# Patient Record
Sex: Female | Born: 1955 | Race: White | Hispanic: No | Smoking: Former smoker
Health system: Southern US, Community
[De-identification: ages and names within clinical notes are randomized; demographics above are authoritative.]

## PROBLEM LIST (undated history)

## (undated) DIAGNOSIS — R3915 Urgency of urination: Secondary | ICD-10-CM

## (undated) DIAGNOSIS — D509 Iron deficiency anemia, unspecified: Secondary | ICD-10-CM

## (undated) DIAGNOSIS — E079 Disorder of thyroid, unspecified: Secondary | ICD-10-CM

## (undated) DIAGNOSIS — E785 Hyperlipidemia, unspecified: Secondary | ICD-10-CM

## (undated) DIAGNOSIS — N201 Calculus of ureter: Secondary | ICD-10-CM

## (undated) DIAGNOSIS — F32A Depression, unspecified: Secondary | ICD-10-CM

## (undated) DIAGNOSIS — T7840XA Allergy, unspecified, initial encounter: Secondary | ICD-10-CM

## (undated) DIAGNOSIS — K219 Gastro-esophageal reflux disease without esophagitis: Secondary | ICD-10-CM

## (undated) DIAGNOSIS — F329 Major depressive disorder, single episode, unspecified: Secondary | ICD-10-CM

## (undated) DIAGNOSIS — E119 Type 2 diabetes mellitus without complications: Secondary | ICD-10-CM

## (undated) DIAGNOSIS — Z974 Presence of external hearing-aid: Secondary | ICD-10-CM

## (undated) DIAGNOSIS — F411 Generalized anxiety disorder: Secondary | ICD-10-CM

## (undated) DIAGNOSIS — J302 Other seasonal allergic rhinitis: Secondary | ICD-10-CM

## (undated) DIAGNOSIS — Z8616 Personal history of COVID-19: Secondary | ICD-10-CM

## (undated) DIAGNOSIS — R011 Cardiac murmur, unspecified: Secondary | ICD-10-CM

## (undated) DIAGNOSIS — Z87442 Personal history of urinary calculi: Secondary | ICD-10-CM

## (undated) DIAGNOSIS — Z87440 Personal history of urinary (tract) infections: Secondary | ICD-10-CM

## (undated) DIAGNOSIS — E890 Postprocedural hypothyroidism: Secondary | ICD-10-CM

## (undated) DIAGNOSIS — K5900 Constipation, unspecified: Secondary | ICD-10-CM

## (undated) DIAGNOSIS — R002 Palpitations: Secondary | ICD-10-CM

## (undated) DIAGNOSIS — T8859XA Other complications of anesthesia, initial encounter: Secondary | ICD-10-CM

## (undated) DIAGNOSIS — M1990 Unspecified osteoarthritis, unspecified site: Secondary | ICD-10-CM

## (undated) DIAGNOSIS — Z8639 Personal history of other endocrine, nutritional and metabolic disease: Secondary | ICD-10-CM

## (undated) DIAGNOSIS — F419 Anxiety disorder, unspecified: Secondary | ICD-10-CM

## (undated) HISTORY — DX: Unspecified osteoarthritis, unspecified site: M19.90

## (undated) HISTORY — DX: Disorder of thyroid, unspecified: E07.9

## (undated) HISTORY — DX: Hyperlipidemia, unspecified: E78.5

## (undated) HISTORY — DX: Anxiety disorder, unspecified: F41.9

## (undated) HISTORY — DX: Gastro-esophageal reflux disease without esophagitis: K21.9

## (undated) HISTORY — DX: Type 2 diabetes mellitus without complications: E11.9

## (undated) HISTORY — PX: UPPER GASTROINTESTINAL ENDOSCOPY: SHX188

## (undated) HISTORY — DX: Depression, unspecified: F32.A

## (undated) HISTORY — DX: Allergy, unspecified, initial encounter: T78.40XA

---

## 2000-10-04 HISTORY — PX: TOTAL ABDOMINAL HYSTERECTOMY W/ BILATERAL SALPINGOOPHORECTOMY: SHX83

## 2010-10-04 HISTORY — PX: CYSTOSCOPY/URETEROSCOPY/HOLMIUM LASER/STENT PLACEMENT: SHX6546

## 2014-12-16 DIAGNOSIS — N200 Calculus of kidney: Secondary | ICD-10-CM | POA: Insufficient documentation

## 2014-12-16 HISTORY — DX: Calculus of kidney: N20.0

## 2015-10-12 DIAGNOSIS — I209 Angina pectoris, unspecified: Secondary | ICD-10-CM | POA: Insufficient documentation

## 2019-11-28 DIAGNOSIS — N6310 Unspecified lump in the right breast, unspecified quadrant: Secondary | ICD-10-CM | POA: Insufficient documentation

## 2020-02-02 DIAGNOSIS — Z8719 Personal history of other diseases of the digestive system: Secondary | ICD-10-CM

## 2020-02-02 HISTORY — PX: LAPAROSCOPIC NISSEN FUNDOPLICATION: SHX1932

## 2020-02-02 HISTORY — DX: Personal history of other diseases of the digestive system: Z87.19

## 2020-02-27 DIAGNOSIS — K219 Gastro-esophageal reflux disease without esophagitis: Secondary | ICD-10-CM | POA: Insufficient documentation

## 2021-01-21 DIAGNOSIS — M7989 Other specified soft tissue disorders: Secondary | ICD-10-CM | POA: Diagnosis not present

## 2021-01-21 DIAGNOSIS — S99929A Unspecified injury of unspecified foot, initial encounter: Secondary | ICD-10-CM | POA: Diagnosis not present

## 2021-02-05 ENCOUNTER — Encounter (HOSPITAL_BASED_OUTPATIENT_CLINIC_OR_DEPARTMENT_OTHER): Payer: Self-pay | Admitting: Nurse Practitioner

## 2021-02-05 ENCOUNTER — Other Ambulatory Visit: Payer: Self-pay

## 2021-02-05 ENCOUNTER — Other Ambulatory Visit (HOSPITAL_BASED_OUTPATIENT_CLINIC_OR_DEPARTMENT_OTHER)
Admission: RE | Admit: 2021-02-05 | Discharge: 2021-02-05 | Disposition: A | Discharge status: Home / Self Care | Payer: Medicare Other | Source: Ambulatory Visit | Attending: Nurse Practitioner | Admitting: Nurse Practitioner

## 2021-02-05 ENCOUNTER — Telehealth (HOSPITAL_BASED_OUTPATIENT_CLINIC_OR_DEPARTMENT_OTHER): Payer: Self-pay

## 2021-02-05 ENCOUNTER — Ambulatory Visit (INDEPENDENT_AMBULATORY_CARE_PROVIDER_SITE_OTHER): Payer: Medicare Other | Admitting: Nurse Practitioner

## 2021-02-05 VITALS — BP 131/64 | HR 105 | Ht 65.0 in | Wt 143.2 lb

## 2021-02-05 DIAGNOSIS — N898 Other specified noninflammatory disorders of vagina: Secondary | ICD-10-CM | POA: Insufficient documentation

## 2021-02-05 DIAGNOSIS — R350 Frequency of micturition: Secondary | ICD-10-CM

## 2021-02-05 DIAGNOSIS — M7501 Adhesive capsulitis of right shoulder: Secondary | ICD-10-CM | POA: Insufficient documentation

## 2021-02-05 DIAGNOSIS — Z7689 Persons encountering health services in other specified circumstances: Secondary | ICD-10-CM

## 2021-02-05 DIAGNOSIS — H539 Unspecified visual disturbance: Secondary | ICD-10-CM

## 2021-02-05 DIAGNOSIS — R000 Tachycardia, unspecified: Secondary | ICD-10-CM

## 2021-02-05 DIAGNOSIS — F411 Generalized anxiety disorder: Secondary | ICD-10-CM | POA: Diagnosis not present

## 2021-02-05 DIAGNOSIS — E890 Postprocedural hypothyroidism: Secondary | ICD-10-CM | POA: Insufficient documentation

## 2021-02-05 DIAGNOSIS — E1165 Type 2 diabetes mellitus with hyperglycemia: Secondary | ICD-10-CM | POA: Diagnosis not present

## 2021-02-05 DIAGNOSIS — E1169 Type 2 diabetes mellitus with other specified complication: Secondary | ICD-10-CM

## 2021-02-05 DIAGNOSIS — E1129 Type 2 diabetes mellitus with other diabetic kidney complication: Secondary | ICD-10-CM

## 2021-02-05 DIAGNOSIS — F320 Major depressive disorder, single episode, mild: Secondary | ICD-10-CM

## 2021-02-05 DIAGNOSIS — G43019 Migraine without aura, intractable, without status migrainosus: Secondary | ICD-10-CM

## 2021-02-05 DIAGNOSIS — R002 Palpitations: Secondary | ICD-10-CM

## 2021-02-05 DIAGNOSIS — E785 Hyperlipidemia, unspecified: Secondary | ICD-10-CM

## 2021-02-05 DIAGNOSIS — IMO0002 Reserved for concepts with insufficient information to code with codable children: Secondary | ICD-10-CM

## 2021-02-05 DIAGNOSIS — J301 Allergic rhinitis due to pollen: Secondary | ICD-10-CM

## 2021-02-05 HISTORY — DX: Persons encountering health services in other specified circumstances: Z76.89

## 2021-02-05 HISTORY — DX: Adhesive capsulitis of right shoulder: M75.01

## 2021-02-05 HISTORY — DX: Other specified noninflammatory disorders of vagina: N89.8

## 2021-02-05 LAB — POCT URINALYSIS DIP (CLINITEK)
Bilirubin, UA: NEGATIVE
Glucose, UA: NEGATIVE mg/dL
Ketones, POC UA: NEGATIVE mg/dL
Nitrite, UA: NEGATIVE
POC PROTEIN,UA: 100 — AB
Spec Grav, UA: 1.025 (ref 1.010–1.025)
Urobilinogen, UA: 0.2 E.U./dL
pH, UA: 6 (ref 5.0–8.0)

## 2021-02-05 LAB — POCT UA - MICROALBUMIN
Albumin/Creatinine Ratio, Urine, POC: 30
Creatinine, POC: 300 mg/dL
Microalbumin Ur, POC: 150 mg/L

## 2021-02-05 LAB — WET PREP, GENITAL
Clue Cells Wet Prep HPF POC: NONE SEEN
Sperm: NONE SEEN
Trich, Wet Prep: NONE SEEN
Yeast Wet Prep HPF POC: NONE SEEN

## 2021-02-05 MED ORDER — NITROFURANTOIN MONOHYD MACRO 100 MG PO CAPS: 100.0000 mg | ORAL_CAPSULE | Freq: Two times a day (BID) | ORAL | 0 refills | Status: DC

## 2021-02-05 NOTE — Assessment & Plan Note (Signed)
Sertraline 50mg  daily.  Symptoms appear well controlled.  No changes today.  Will request refills as needed.

## 2021-02-05 NOTE — Assessment & Plan Note (Signed)
Review of current and past medical history, medications, social history, and family history today.  Will request records from Dr. Emeline General in [place] for review.  HM, diet, and exercise recommendations provided.

## 2021-02-05 NOTE — Assessment & Plan Note (Signed)
HR 108 in office today with regular rhythm. Patient has no associated symptoms present. No concerning findings on exam.  Consider imbalance in thyroid hormones a possible cause, labs drawn today. DM pt with urinary symptoms present may indicate possible dehydration, as well.  Pt reported symptoms present in past with recommendation for cardiology referral if continues. Referral placed today while labs pending for further evaluation.  Discussed emergency signs that would warrant immediate evaluation.

## 2021-02-05 NOTE — Assessment & Plan Note (Signed)
Limited ROM without pain to the right shoulder with history of frozen shoulder and previous therapy.  Referral for PT placed today for further evaluation and recommendations.

## 2021-02-05 NOTE — Patient Instructions (Addendum)
Recommendations from today's visit:  [employer] is an [employer] that sells inexpensive glasses that you can get with blue light and with the magnification.   I have sent the referral for physical therapy for your shoulder.   I have sent a referral to Cardiology for elevated heart rate and palpitations  I have sent a referral to Ophthalmology for eye care  I have ordered labs today of CBC, CMP, Thyroid, Cholesterol, and Hemoglobin A1c.   I will let you know your lab results and if we need to make any changes.   We will plan for follow-up in 4 months or sooner if needed.   Information on diet, exercise, and health maintenance recommendations are listed below. This is information to help you be sure you are on track for optimal health and monitoring. Please look over this and let us know if you have any questions or if you have completed any of the health maintenance outside of [place] so that we can be sure your records are up to date.  ___________________________________________________________  Thank you for choosing [facility] for your Primary Care needs. I am excited for the opportunity to partner with you to meet your health care goals. It was a pleasure meeting you today!  I am an Adult-Geriatric Nurse Practitioner with a background in caring for patients for more than 20 years. I received my Paediatric nurse in Nursing and my Doctor of Nursing Practice degrees at [employer]. I received additional fellowship training in primary care and sports medicine after receiving my doctorate degree. I provide primary care and sports medicine services to patients age 3 and older within this office. I am also a provider with the [facility] and the director of the APP Fellowship with [facility].  I am a [place] native, but have called the [place] area home for nearly 20 years and am proud to be a member of  this community.   I am passionate about providing the best service to you through preventive medicine and supportive care. I consider you a part of the medical team and value your input. I work diligently to ensure that you are heard and your needs are met in a safe and effective manner. I want you to feel comfortable with me as your provider and want you to know that your health concerns are important to me.   For your information, our office hours are Monday- Friday 8:00 AM - 5:00 PM At this time I am not in the office on Wednesdays.  If you have questions or concerns, please call our office at [phone] or send [place] a MyChart message and we will respond as quickly as possible.   For all urgent or time sensitive needs we ask that you please call the office to avoid delays. MyChart is not constantly monitored and replies may take up to 72 business hours.  MyChart Policy: . MyChart allows for you to see your visit notes, after visit summary, provider recommendations, lab and tests results, make an appointment, request refills, and contact your provider or the office for non-urgent questions or concerns.  . Providers are seeing patients during normal business hours and do not have built in time to review MyChart messages. We ask that you allow a minimum of 72 business hours for MyChart message responses.  . Complex MyChart concerns may require a visit. Your provider may request you schedule a virtual or in  person visit to ensure we are providing the best care possible. . MyChart messages sent after 4:00 PM on Friday will not be received by the provider until Monday morning.    Lab and Test Results: . You will receive your lab and test results on MyChart as soon as they are completed and results have been sent by the lab or testing facility. Due to this service, you will receive your results BEFORE your provider.  . Please allow a minimum of 72 business hours for your provider to receive and  review lab and test results and contact you about.   . Most lab and test result comments from the provider will be sent through [facility]. Your provider may recommend changes to the plan of care, follow-up visits, repeat testing, ask questions, or request an office visit to discuss these results. You may reply directly to this message or call the office at [phone] to provide information for the provider or set up an appointment. . In some instances, you will be called with test results and recommendations. Please let us know if this is preferred and we will make note of this in your chart to provide this for you.    . If you have not heard a response to your lab or test results in 72 business hours, please call the office to let us know.   After Hours: . For all non-emergency after hours needs, please call the office at [phone] and select the option to reach the on-call provider service. On-call services are shared between multiple [facility] offices and therefore it will not be possible to speak directly with your provider. On-call providers may provide medical advice and recommendations, but are unable to provide refills for maintenance medications.  . For all emergency or urgent medical needs after normal business hours, we recommend that you seek care at the closest Urgent Care or Emergency Department to ensure appropriate treatment in a timely manner.  [facility] [facility] has a 24 hour emergency room located on the ground floor for your convenience.    Please do not hesitate to reach out to [place] with concerns.   Thank you, again, for choosing me as your health care partner. I appreciate your trust and look forward to learning more about you.   Worthy Keeler, DNP, AGNP-c ___________________________________________________________  Health Maintenance Recommendations Screening Testing  Mammogram  Every 1 -2 years based on history and risk factors  Starting at age  51  Pap Smear  Ages 21-39 every 3 years  Ages 55-65 every 5 years with HPV testing  More frequent testing may be required based on results and history  Colon Cancer Screening  Every 1-10 years based on test performed, risk factors, and history  Starting at age 51  Bone Density Screening  Every 2-10 years based on history  Starting at age 52 for women  Recommendations for men differ based on medication usage, history, and risk factors  AAA Screening  One time ultrasound  Men 43-11 years old who have every smoked  Lung Cancer Screening  Low Dose Lung CT every 12 months  Age 46-80 years with a 30 pack-year smoking history who still smoke or who have quit within the last 15 years  Screening Labs  Routine  Labs: Complete Blood Count (CBC), Complete Metabolic Panel (CMP), Cholesterol (Lipid Panel)  Every 6-12 months based on history and medications  May be recommended more frequently based on current conditions or previous results  Hemoglobin  A1c Lab  Every 3-12 months based on history and previous results  Starting at age 67 or earlier with diagnosis of diabetes, high cholesterol, BMI >26, and/or risk factors  Frequent monitoring for patients with diabetes to ensure blood sugar control  Thyroid Panel (TSH w/ T3 & T4)  Every 6 months based on history, symptoms, and risk factors  May be repeated more often if on medication  HIV  One time testing for all patients 49 and older  May be repeated more frequently for patients with increased risk factors or exposure  Hepatitis C  One time testing for all patients 67 and older  May be repeated more frequently for patients with increased risk factors or exposure  Gonorrhea, Chlamydia  Every 12 months for all sexually active persons 13-24 years  Additional monitoring may be recommended for those who are considered high risk or who have symptoms  PSA  Men 86-39 years old with risk factors  Additional  screening may be recommended from age 46-69 based on risk factors, symptoms, and history  Vaccine Recommendations  Tetanus Booster  All adults every 10 years  Flu Vaccine  All patients 6 months and older every year  COVID Vaccine  All patients 12 years and older  Initial dosing with booster  May recommend additional booster based on age and health history  HPV Vaccine  2 doses all patients age 60-26  Dosing may be considered for patients over 26  Shingles Vaccine (Shingrix)  2 doses all adults 79 years and older  Pneumonia (Pneumovax 23)  All adults 13 years and older  May recommend earlier dosing based on health history  Pneumonia (Prevnar 62)  All adults 40 years and older  Dosed 1 year after Pneumovax 23  Additional Screening, Testing, and Vaccinations may be recommended on an individualized basis based on family history, health history, risk factors, and/or exposure.  __________________________________________________________  Diet Recommendations for All Patients  I recommend that all patients maintain a diet low in saturated fats, carbohydrates, and cholesterol. While this can be challenging at first, it is not impossible and small changes can make big differences.  Things to try: Marland Kitchen Decreasing the amount of soda, sweet tea, and/or juice to one or less per day and replace with water o While water is always the first choice, if you do not like water you may consider - adding a water additive without sugar to improve the taste - other sugar free drinks . Replace potatoes with a brightly colored vegetable at dinner . Use healthy oils, such as canola oil or olive oil, instead of butter or hard margarine . Limit your bread intake to two pieces or less a day . Replace regular pasta with low carb pasta options . Bake, broil, or grill foods instead of frying . Monitor portion sizes  . Eat smaller, more frequent meals throughout the day instead of large  meals  An important thing to remember is, if you love foods that are not great for your health, you don't have to give them up completely. Instead, allow these foods to be a reward when you have done well. Allowing yourself to still have special treats every once in a while is a nice way to tell yourself thank you for working hard to keep yourself healthy.   Also remember that every day is a new day. If you have a bad day and "fall off the wagon", you can still climb right back up and keep moving along on  your journey!  We have resources available to help you!  Some websites that may be helpful include: . www.http://carter.biz/  . Www.VeryWellFit.com _____________________________________________________________  Activity Recommendations for All Patients  I recommend that all adults get at least 20 minutes of moderate physical activity that elevates your heart rate at least 5 days out of the week.  Some examples include: . Walking or jogging at a pace that allows you to carry on a conversation . Cycling (stationary bike or outdoors) . Water aerobics . Yoga . Weight lifting . Dancing If physical limitations prevent you from putting stress on your joints, exercise in a pool or seated in a chair are excellent options.  Do determine your MAXIMUM heart rate for activity: YOUR AGE - 220 = MAX HeartRate   Remember! . Do not push yourself too hard.  . Start slowly and build up your pace, speed, weight, time in exercise, etc.  . Allow your body to rest between exercise and get good sleep. . You will need more water than normal when you are exerting yourself. Do not wait until you are thirsty to drink. Drink with a purpose of getting in at least 8, 8 ounce glasses of water a day plus more depending on how much you exercise and sweat.    If you begin to develop dizziness, chest pain, abdominal pain, jaw pain, shortness of breath, headache, vision changes, lightheadedness, or other concerning symptoms,  stop the activity and allow your body to rest. If your symptoms are severe, seek emergency evaluation immediately. If your symptoms are concerning, but not severe, please let us know so that we can recommend further evaluation.   ________________________________________________________________

## 2021-02-05 NOTE — Assessment & Plan Note (Signed)
Report of floaters intermittently only occurring upon awakening with no other symptoms present. No vision changes. No alarm symptoms present. Vision intact.  History of lasix surgery with reported 20/20 vision for last 15 years. Hx of DM. BP normal today.  Referral placed to Ophthalmology today.

## 2021-02-05 NOTE — Assessment & Plan Note (Signed)
Records to be requested from previous provider.  Currently on Metformin 750mg  q AM, 500mg  qPM Urine microalbumin positive today.  Will obtain A1c and renal function and make changes to plan of care as necessary based on lab results. F/U in 4 months.

## 2021-02-05 NOTE — Assessment & Plan Note (Signed)
Well controlled with daily nortriptyline.  No acute findings noted.  Continue medication as directed.  Refills will be requested when needed.

## 2021-02-05 NOTE — Assessment & Plan Note (Signed)
Well controlled with PRN flonase and zyrtec.  No concerning findings today.
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
## 2021-02-05 NOTE — Assessment & Plan Note (Signed)
Levothyroxine daily Will obtain TSH levels today for evaluation and make changes to plan of care as needed based on lab results.  Tachycardia with regular rhythm noted- possibly due to thyroid, but given symptom presence for quite some time will also send referral to cardiology for further evaluation.

## 2021-02-05 NOTE — Assessment & Plan Note (Signed)
UA in office today shows presence of leukocytes with small amount of blood.  Proteins present in the urine, as well.  Will treat for suspected UTI and obtain labs for diabetes and kidney function for further evaluation.  Concern over worsening kidney function present.

## 2021-02-05 NOTE — Telephone Encounter (Signed)
patient called in to check the status of the antibiotic that she was supposed to be prescribed for a UTI Will forward to [name], AGNP for advisement. Patient request that prescription be sent to [facility] that is on file

## 2021-02-05 NOTE — Assessment & Plan Note (Signed)
Subjective palpitations with no irregularities to beat or associated symptoms present.  See tachycardia for full A&P

## 2021-02-05 NOTE — Assessment & Plan Note (Signed)
Sertraline 50mg  per day with PRN 0.5mg  xanax (mostly used for flying anxiety). Symptoms appear well controlled.  No changes today. Discussed option to decrease dose if she wishes in the future, but strongly recommend that she wait until she has been at least 12 months out of significant life events to prevent recurrence of severe symptoms.

## 2021-02-05 NOTE — Assessment & Plan Note (Signed)
Wet prep collected today- self swab.  Question whether diabetes is not well controlled at this time.  Will obtain labs for evaluation.

## 2021-02-05 NOTE — Assessment & Plan Note (Signed)
Atorvastatin 40mg  daily.  Will obtain labs today for evaluation.  F/U in 4 months.

## 2021-02-06 ENCOUNTER — Other Ambulatory Visit (HOSPITAL_BASED_OUTPATIENT_CLINIC_OR_DEPARTMENT_OTHER)
Admission: RE | Admit: 2021-02-06 | Discharge: 2021-02-06 | Disposition: A | Discharge status: Home / Self Care | Payer: Medicare Other | Source: Ambulatory Visit | Attending: Nurse Practitioner | Admitting: Nurse Practitioner

## 2021-02-06 ENCOUNTER — Other Ambulatory Visit (HOSPITAL_BASED_OUTPATIENT_CLINIC_OR_DEPARTMENT_OTHER): Payer: Self-pay | Admitting: Nurse Practitioner

## 2021-02-06 DIAGNOSIS — R002 Palpitations: Secondary | ICD-10-CM

## 2021-02-06 DIAGNOSIS — N898 Other specified noninflammatory disorders of vagina: Secondary | ICD-10-CM

## 2021-02-06 DIAGNOSIS — R000 Tachycardia, unspecified: Secondary | ICD-10-CM

## 2021-02-06 DIAGNOSIS — E1165 Type 2 diabetes mellitus with hyperglycemia: Secondary | ICD-10-CM

## 2021-02-06 DIAGNOSIS — F411 Generalized anxiety disorder: Secondary | ICD-10-CM

## 2021-02-06 DIAGNOSIS — E785 Hyperlipidemia, unspecified: Secondary | ICD-10-CM | POA: Insufficient documentation

## 2021-02-06 DIAGNOSIS — E1169 Type 2 diabetes mellitus with other specified complication: Secondary | ICD-10-CM | POA: Diagnosis not present

## 2021-02-06 DIAGNOSIS — E890 Postprocedural hypothyroidism: Secondary | ICD-10-CM

## 2021-02-06 DIAGNOSIS — H539 Unspecified visual disturbance: Secondary | ICD-10-CM | POA: Diagnosis not present

## 2021-02-06 DIAGNOSIS — F320 Major depressive disorder, single episode, mild: Secondary | ICD-10-CM | POA: Diagnosis not present

## 2021-02-06 LAB — CBC WITH DIFFERENTIAL/PLATELET
Abs Immature Granulocytes: 0.03 10*3/uL (ref 0.00–0.07)
Basophils Absolute: 0 10*3/uL (ref 0.0–0.1)
Basophils Relative: 0 %
Eosinophils Absolute: 0.2 10*3/uL (ref 0.0–0.5)
Eosinophils Relative: 1 %
HCT: 43.6 % (ref 36.0–46.0)
Hemoglobin: 13.8 g/dL (ref 12.0–15.0)
Immature Granulocytes: 0 %
Lymphocytes Relative: 15 %
Lymphs Abs: 2 10*3/uL (ref 0.7–4.0)
MCH: 28.8 pg (ref 26.0–34.0)
MCHC: 31.7 g/dL (ref 30.0–36.0)
MCV: 90.8 fL (ref 80.0–100.0)
Monocytes Absolute: 0.9 10*3/uL (ref 0.1–1.0)
Monocytes Relative: 7 %
Neutro Abs: 10 10*3/uL — ABNORMAL HIGH (ref 1.7–7.7)
Neutrophils Relative %: 77 %
Platelets: 308 10*3/uL (ref 150–400)
RBC: 4.8 MIL/uL (ref 3.87–5.11)
RDW: 12.2 % (ref 11.5–15.5)
WBC: 13.2 10*3/uL — ABNORMAL HIGH (ref 4.0–10.5)
nRBC: 0 % (ref 0.0–0.2)

## 2021-02-06 LAB — COMPREHENSIVE METABOLIC PANEL
ALT: 24 U/L (ref 0–44)
AST: 20 U/L (ref 15–41)
Albumin: 4.3 g/dL (ref 3.5–5.0)
Alkaline Phosphatase: 78 U/L (ref 38–126)
Anion gap: 11 (ref 5–15)
BUN: 29 mg/dL — ABNORMAL HIGH (ref 8–23)
CO2: 27 mmol/L (ref 22–32)
Calcium: 9.8 mg/dL (ref 8.9–10.3)
Chloride: 100 mmol/L (ref 98–111)
Creatinine, Ser: 1.08 mg/dL — ABNORMAL HIGH (ref 0.44–1.00)
GFR, Estimated: 57 mL/min — ABNORMAL LOW (ref >60)
Glucose, Bld: 171 mg/dL — ABNORMAL HIGH (ref 70–99)
Potassium: 4 mmol/L (ref 3.5–5.1)
Sodium: 138 mmol/L (ref 135–145)
Total Bilirubin: 0.4 mg/dL (ref 0.3–1.2)
Total Protein: 7.5 g/dL (ref 6.5–8.1)

## 2021-02-06 LAB — LIPID PANEL
Cholesterol: 163 mg/dL (ref 0–200)
HDL: 48 mg/dL (ref >40)
LDL Cholesterol: 82 mg/dL (ref 0–99)
Total CHOL/HDL Ratio: 3.4 RATIO
Triglycerides: 163 mg/dL — ABNORMAL HIGH (ref <150)
VLDL: 33 mg/dL (ref 0–40)

## 2021-02-06 LAB — HEMOGLOBIN A1C
Hgb A1c MFr Bld: 6.6 % — ABNORMAL HIGH (ref 4.8–5.6)
Mean Plasma Glucose: 142.72 mg/dL

## 2021-02-06 MED ORDER — FLUCONAZOLE 150 MG PO TABS: 150.0000 mg | ORAL_TABLET | Freq: Once | ORAL | 0 refills | Status: AC

## 2021-02-06 NOTE — Progress Notes (Signed)
No yeast, clue, or trich present.  WBC present indicating infection- typically yeast.  Given symptoms will treat with one dose of diflucan. If symptoms still present Monday, patient will contact the office.

## 2021-02-06 NOTE — Progress Notes (Signed)
Please send patient a message. Hbg A1c is 6.6. Ideally we want this less than 7 in diabetic patients so this is a good spot for you.   Triglycerides are a little elevated- this is the fat in your blood and can lead to problems with your pancreas if it gets too high.  Recommend decreasing the fats in your diet and walking 20 minutes 5 days a week to help lower this.   Your kidney function is not as good as I would like for it to be- this could be from the UTI so we will plan to recheck this when you come back for your next visit.

## 2021-02-07 LAB — THYROID PANEL WITH TSH
Free Thyroxine Index: 4.5 (ref 1.2–4.9)
T3 Uptake Ratio: 33 % (ref 24–39)
T4, Total: 13.5 ug/dL — ABNORMAL HIGH (ref 4.5–12.0)
TSH: 3.09 u[IU]/mL (ref 0.450–4.500)

## 2021-02-09 ENCOUNTER — Telehealth (HOSPITAL_BASED_OUTPATIENT_CLINIC_OR_DEPARTMENT_OTHER): Payer: Self-pay

## 2021-02-09 MED ORDER — LISINOPRIL 2.5 MG PO TABS: 2.5000 mg | ORAL_TABLET | Freq: Every day | ORAL | 1 refills | Status: DC

## 2021-02-09 MED ORDER — METFORMIN HCL 500 MG PO TABS: 500.0000 mg | ORAL_TABLET | Freq: Two times a day (BID) | ORAL | 1 refills | Status: DC

## 2021-02-09 NOTE — Telephone Encounter (Signed)
Patient called for refills on Lisinopril 2.5 mg and Metformin 500 mg to be sent in to [facility] on [address].

## 2021-02-09 NOTE — Telephone Encounter (Signed)
-----   Message from Tollie Eth, NP sent at 02/06/2021  6:22 PM EDT ----- Please send patient a message. Hbg A1c is 6.6. Ideally we want this less than 7 in diabetic patients so this is a good spot for you.   Triglycerides are a little elevated- this is the fat in your blood and can lead to problems with your pancreas if it gets too high.  Recommend decreasing the fats in your diet and walking 20 minutes 5 days a week to help lower this.   Your kidney function is not as good as I would like for it to be- this could be from the UTI so we will plan to recheck this when you come back for your next visit.

## 2021-02-09 NOTE — Telephone Encounter (Signed)
Patient called to request medication refills. Spoke with patient re: lab results.  She is  aware and agreeable with recommendations.

## 2021-02-10 ENCOUNTER — Telehealth (HOSPITAL_BASED_OUTPATIENT_CLINIC_OR_DEPARTMENT_OTHER): Payer: Self-pay

## 2021-02-10 NOTE — Telephone Encounter (Signed)
Patient called to report she is still having discomfort and UTI symptoms.  Scheduled a nurse visit for her to submit a urine sample.

## 2021-02-10 NOTE — Progress Notes (Signed)
Please send patient message:  Thyroid function looks good- no concerns or changes recommended.

## 2021-02-11 ENCOUNTER — Encounter (HOSPITAL_BASED_OUTPATIENT_CLINIC_OR_DEPARTMENT_OTHER): Payer: Self-pay

## 2021-02-11 ENCOUNTER — Other Ambulatory Visit: Payer: Self-pay

## 2021-02-11 ENCOUNTER — Other Ambulatory Visit (HOSPITAL_BASED_OUTPATIENT_CLINIC_OR_DEPARTMENT_OTHER)
Admission: RE | Admit: 2021-02-11 | Discharge: 2021-02-11 | Disposition: A | Discharge status: Home / Self Care | Payer: Medicare Other | Source: Ambulatory Visit | Attending: Nurse Practitioner | Admitting: Nurse Practitioner

## 2021-02-11 ENCOUNTER — Ambulatory Visit (INDEPENDENT_AMBULATORY_CARE_PROVIDER_SITE_OTHER): Payer: Medicare Other | Admitting: Nurse Practitioner

## 2021-02-11 ENCOUNTER — Other Ambulatory Visit (INDEPENDENT_AMBULATORY_CARE_PROVIDER_SITE_OTHER): Payer: Medicare Other | Admitting: Nurse Practitioner

## 2021-02-11 VITALS — BP 135/75 | Wt 145.4 lb

## 2021-02-11 DIAGNOSIS — N3289 Other specified disorders of bladder: Secondary | ICD-10-CM

## 2021-02-11 DIAGNOSIS — R300 Dysuria: Secondary | ICD-10-CM

## 2021-02-11 DIAGNOSIS — R350 Frequency of micturition: Secondary | ICD-10-CM

## 2021-02-11 DIAGNOSIS — N3090 Cystitis, unspecified without hematuria: Secondary | ICD-10-CM

## 2021-02-11 LAB — POCT URINALYSIS DIPSTICK
Bilirubin, UA: NEGATIVE
Glucose, UA: NEGATIVE
Ketones, UA: NEGATIVE
Nitrite, UA: NEGATIVE
Protein, UA: NEGATIVE
Spec Grav, UA: 1.015 (ref 1.010–1.025)
Urobilinogen, UA: 0.2 E.U./dL
pH, UA: 5.5 (ref 5.0–8.0)

## 2021-02-11 MED ORDER — PHENAZOPYRIDINE HCL 200 MG PO TABS: 200.0000 mg | ORAL_TABLET | Freq: Three times a day (TID) | ORAL | 0 refills | Status: AC

## 2021-02-11 MED ORDER — CIPROFLOXACIN HCL 250 MG PO TABS: 250.0000 mg | ORAL_TABLET | Freq: Two times a day (BID) | ORAL | 0 refills | Status: AC

## 2021-02-11 NOTE — Progress Notes (Signed)
Dysuria, bladder spasms, increased urination, and low back pain symptoms reported. No fever, chills, nausea, vomiting.   Repeat urinalysis at nurse visit reveals continued presents of large leukocytes and small amount of blood despite recent treatment with macrobid.  Will begin treatment with ciprofloxacin 250mg  BID for 3 days. Will add pyridium 200mg  TID for 2 days for acute symptom control.  Cockcroft Gault calculation reveals estimated creatinine clearance at 54.39 mL/min  UpToDate review of recommendations for adjusted dosing with decreased kidney function indicates no dose adjustment necessary for creatinine clearance >61mL/min.   Will send urine for culture and sensitivity to ensure appropriate treatment review and make changes to medication if necessary.   Pt to follow-up if symptoms worsen or persist despite treatment.   - , DNP, AGNP-c

## 2021-02-11 NOTE — Patient Instructions (Signed)
Urinary Tract Infection, Adult A urinary tract infection (UTI) is an infection of any part of the urinary tract. The urinary tract includes:  The kidneys.  The ureters.  The bladder.  The urethra. These organs make, store, and get rid of pee (urine) in the body. What are the causes? This infection is caused by germs (bacteria) in your genital area. These germs grow and cause swelling (inflammation) of your urinary tract. What increases the risk? The following factors may make you more likely to develop this condition:  Using a small, thin tube (catheter) to drain pee.  Not being able to control when you pee or poop (incontinence).  Being female. If you are female, these things can increase the risk: ? Using these methods to prevent pregnancy:  A medicine that kills sperm (spermicide).  A device that blocks sperm (diaphragm). ? Having low levels of a female hormone (estrogen). ? Being pregnant. You are more likely to develop this condition if:  You have genes that add to your risk.  You are sexually active.  You take antibiotic medicines.  You have trouble peeing because of: ? A prostate that is bigger than normal, if you are female. ? A blockage in the part of your body that drains pee from the bladder. ? A kidney stone. ? A nerve condition that affects your bladder. ? Not getting enough to drink. ? Not peeing often enough.  You have other conditions, such as: ? Diabetes. ? A weak disease-fighting system (immune system). ? Sickle cell disease. ? Gout. ? Injury of the spine. What are the signs or symptoms? Symptoms of this condition include:  Needing to pee right away.  Peeing small amounts often.  Pain or burning when peeing.  Blood in the pee.  Pee that smells bad or not like normal.  Trouble peeing.  Pee that is cloudy.  Fluid coming from the vagina, if you are female.  Pain in the belly or lower back. Other symptoms include:  Vomiting.  Not  feeling hungry.  Feeling mixed up (confused). This may be the first symptom in older adults.  Being tired and grouchy (irritable).  A fever.  Watery poop (diarrhea). How is this treated?  Taking antibiotic medicine.  Taking other medicines.  Drinking enough water. In some cases, you may need to see a specialist. Follow these instructions at home: Medicines  Take over-the-counter and prescription medicines only as told by your doctor.  If you were prescribed an antibiotic medicine, take it as told by your doctor. Do not stop taking it even if you start to feel better. General instructions  Make sure you: ? Pee until your bladder is empty. ? Do not hold pee for a long time. ? Empty your bladder after sex. ? Wipe from front to back after peeing or pooping if you are a female. Use each tissue one time when you wipe.  Drink enough fluid to keep your pee pale yellow.  Keep all follow-up visits.   Contact a doctor if:  You do not get better after 1-2 days.  Your symptoms go away and then come back. Get help right away if:  You have very bad back pain.  You have very bad pain in your lower belly.  You have a fever.  You have chills.  You feeling like you will vomit or you vomit. Summary  A urinary tract infection (UTI) is an infection of any part of the urinary tract.  This condition is caused by   germs in your genital area.  There are many risk factors for a UTI.  Treatment includes antibiotic medicines.  Drink enough fluid to keep your pee pale yellow. This information is not intended to replace advice given to you by your health care provider. Make sure you discuss any questions you have with your health care provider. Document Revised: 05/02/2020 Document Reviewed: 05/02/2020 Elsevier Patient Education  2021 [employer] Inc[employer]  

## 2021-02-11 NOTE — Progress Notes (Signed)
SUBJECTIVE:  65 y.o. complains of urinary frequency, urgency and dysuria for 14 days. No flank pain, fever, chills, or abnormal vaginal discharge or bleeding. Urine dip shows positive for WBC's, positive for RBC's and positive for leukocytes.  OBJECTIVE:  BP 135/75   Wt 145 lb 6.4 oz (66 kg)   BMI 24.20 kg/m  Patient appears well. No flank tenderness noted. ASSESSMENT:  Symptoms are consistent with uncomplicated UTI.  PLAN:  Prescription for sent by provider. Increase fluids, 8-10 glasses water daily. May use Pyridium prn sent by provider. Will notify patient if culture result requires any change in treatment plan. Follow up if symptoms persist or worsen.   Agree with CMA evaluation and recommendations for patient. Medications have been sent to pharmacy and patient educated on warning symptoms that would warrant further evaluation. Enid Skeens, DNP, AGNP

## 2021-02-12 ENCOUNTER — Telehealth (HOSPITAL_BASED_OUTPATIENT_CLINIC_OR_DEPARTMENT_OTHER): Payer: Self-pay

## 2021-02-12 NOTE — Telephone Encounter (Signed)
Received a message from the lab stating --  "The container was not accepted per [facility]. They cannot run the culture per sample submitted. If you still have a urine on the patient we can resend. The container was a covid tube. It needs to be in a C&S tube."  Will defer to [facility] Early for advisement.

## 2021-02-13 ENCOUNTER — Telehealth (HOSPITAL_BASED_OUTPATIENT_CLINIC_OR_DEPARTMENT_OTHER): Payer: Self-pay

## 2021-02-13 NOTE — Telephone Encounter (Signed)
-----   Message from Tollie Eth, NP sent at 02/10/2021  6:59 AM EDT ----- Please send patient message:  Thyroid function looks good- no concerns or changes recommended.

## 2021-02-13 NOTE — Telephone Encounter (Signed)
Results released by Sarabeth Early, AGNP.  Patient was given a copy of all lab results.  Instructed patient to contact the office with any questions or concerns.  

## 2021-02-16 NOTE — Telephone Encounter (Signed)
Results released by [name], AGNP.  Patient was given a copy of all lab results.  Instructed patient to contact the office with any questions or concerns.

## 2021-02-24 ENCOUNTER — Other Ambulatory Visit: Payer: Self-pay

## 2021-02-24 ENCOUNTER — Encounter (HOSPITAL_BASED_OUTPATIENT_CLINIC_OR_DEPARTMENT_OTHER): Payer: Self-pay | Admitting: Nurse Practitioner

## 2021-02-24 ENCOUNTER — Ambulatory Visit (INDEPENDENT_AMBULATORY_CARE_PROVIDER_SITE_OTHER): Payer: Medicare Other | Admitting: Nurse Practitioner

## 2021-02-24 VITALS — BP 120/55 | HR 91 | Ht 65.0 in | Wt 144.4 lb

## 2021-02-24 DIAGNOSIS — F320 Major depressive disorder, single episode, mild: Secondary | ICD-10-CM | POA: Diagnosis not present

## 2021-02-24 DIAGNOSIS — R350 Frequency of micturition: Secondary | ICD-10-CM | POA: Diagnosis not present

## 2021-02-24 DIAGNOSIS — M545 Low back pain, unspecified: Secondary | ICD-10-CM | POA: Diagnosis not present

## 2021-02-24 DIAGNOSIS — U099 Post covid-19 condition, unspecified: Secondary | ICD-10-CM

## 2021-02-24 DIAGNOSIS — R053 Chronic cough: Secondary | ICD-10-CM

## 2021-02-24 DIAGNOSIS — N3090 Cystitis, unspecified without hematuria: Secondary | ICD-10-CM | POA: Insufficient documentation

## 2021-02-24 HISTORY — DX: Chronic cough: R05.3

## 2021-02-24 LAB — POCT URINALYSIS DIPSTICK
Bilirubin, UA: NEGATIVE
Glucose, UA: NEGATIVE
Ketones, UA: NEGATIVE
Nitrite, UA: NEGATIVE
Protein, UA: POSITIVE — AB
Spec Grav, UA: 1.025 (ref 1.010–1.025)
Urobilinogen, UA: 0.2 E.U./dL
pH, UA: 5.5 (ref 5.0–8.0)

## 2021-02-24 MED ORDER — CIPROFLOXACIN HCL 500 MG PO TABS
500.0000 mg | ORAL_TABLET | Freq: Two times a day (BID) | ORAL | 0 refills | Status: DC
Start: 2021-02-24 — End: 2021-03-16

## 2021-02-24 NOTE — Assessment & Plan Note (Signed)
Symptoms consistent with recurrent cystitis infection.  Abx therapy started today while awaiting urine culture results given repeat nature.  Recommend aquaphor to vaginal opening and labia to help restore moisture to the vaginal introitus and restart topical estrogen cream once a week to see if this is helpful in reducing symptoms.

## 2021-02-24 NOTE — Patient Instructions (Addendum)
Start using the [place] once a week and up to once a day use the Aquaphor vaginally daily to help with vaginal moisture.      Urinary Tract Infection, Adult A urinary tract infection (UTI) is an infection of any part of the urinary tract. The urinary tract includes:  The kidneys.  The ureters.  The bladder.  The urethra. These organs make, store, and get rid of pee (urine) in the body. What are the causes? This infection is caused by germs (bacteria) in your genital area. These germs grow and cause swelling (inflammation) of your urinary tract. What increases the risk? The following factors may make you more likely to develop this condition:  Using a small, thin tube (catheter) to drain pee.  Not being able to control when you pee or poop (incontinence).  Being female. If you are female, these things can increase the risk: ? Using these methods to prevent pregnancy:  A medicine that kills sperm (spermicide).  A device that blocks sperm (diaphragm). ? Having low levels of a female hormone (estrogen). ? Being pregnant. You are more likely to develop this condition if:  You have genes that add to your risk.  You are sexually active.  You take antibiotic medicines.  You have trouble peeing because of: ? A prostate that is bigger than normal, if you are female. ? A blockage in the part of your body that drains pee from the bladder. ? A kidney stone. ? A nerve condition that affects your bladder. ? Not getting enough to drink. ? Not peeing often enough.  You have other conditions, such as: ? Diabetes. ? A weak disease-fighting system (immune system). ? Sickle cell disease. ? Gout. ? Injury of the spine. What are the signs or symptoms? Symptoms of this condition include:  Needing to pee right away.  Peeing small amounts often.  Pain or burning when peeing.  Blood in the pee.  Pee that smells bad or not like normal.  Trouble peeing.  Pee that is  cloudy.  Fluid coming from the vagina, if you are female.  Pain in the belly or lower back. Other symptoms include:  Vomiting.  Not feeling hungry.  Feeling mixed up (confused). This may be the first symptom in older adults.  Being tired and grouchy (irritable).  A fever.  Watery poop (diarrhea). How is this treated?  Taking antibiotic medicine.  Taking other medicines.  Drinking enough water. In some cases, you may need to see a specialist. Follow these instructions at home: Medicines  Take over-the-counter and prescription medicines only as told by your doctor.  If you were prescribed an antibiotic medicine, take it as told by your doctor. Do not stop taking it even if you start to feel better. General instructions  Make sure you: ? Pee until your bladder is empty. ? Do not hold pee for a long time. ? Empty your bladder after sex. ? Wipe from front to back after peeing or pooping if you are a female. Use each tissue one time when you wipe.  Drink enough fluid to keep your pee pale yellow.  Keep all follow-up visits.   Contact a doctor if:  You do not get better after 1-2 days.  Your symptoms go away and then come back. Get help right away if:  You have very bad back pain.  You have very bad pain in your lower belly.  You have a fever.  You have chills.  You feeling like you  will vomit or you vomit. Summary  A urinary tract infection (UTI) is an infection of any part of the urinary tract.  This condition is caused by germs in your genital area.  There are many risk factors for a UTI.  Treatment includes antibiotic medicines.  Drink enough fluid to keep your pee pale yellow. This information is not intended to replace advice given to you by your health care provider. Make sure you discuss any questions you have with your health care provider. Document Revised: 05/02/2020 Document Reviewed: 05/02/2020 Elsevier Patient Education  2021 Elsevier  Inc.   Atrophic Vaginitis  Atrophic vaginitis is a condition in which the tissues that line the vagina become dry and thin. This condition is most common in women who have stopped having regular menstrual periods (are in menopause). This usually starts when a woman is 88 to 67 years old. That is the time when a woman's estrogen levels begin to decrease. Estrogen is a female hormone. It helps to keep the tissues of the vagina moist. It stimulates the vagina to produce a clear fluid that lubricates the vagina for sex. This fluid also protects the vagina from infection. Lack of estrogen can cause the lining of the vagina to get thinner and dryer. The vagina may also shrink in size. It may become less elastic. Atrophic vaginitis tends to get worse over time as a woman's estrogen level drops. What are the causes? This condition is caused by the normal drop in estrogen that happens around the time of menopause. What increases the risk? Certain conditions or situations may lower a woman's estrogen level, leading to a higher risk for atrophic vaginitis. You are more likely to develop this condition if:  You are taking medicines that block estrogen.  You have had your ovaries removed.  You are being treated for cancer with radiation or medicines (chemotherapy).  You have given birth or are breastfeeding.  You are older than age 3.  You smoke. What are the signs or symptoms? Symptoms of this condition include:  Pain, soreness, a feeling of pressure, or bleeding during sex (dyspareunia).  Vaginal burning, irritation, or itching.  Pain or bleeding when a speculum is used in a vaginal exam.  Having burning pain while urinating.  Vaginal discharge. In some cases, there are no symptoms. How is this diagnosed? This condition is diagnosed based on your medical history and a physical exam. This will include a pelvic exam that checks the vaginal tissues. Though rare, you may also have other tests,  including:  A urine test.  A test that checks the acid balance in your vagina (acid balance test). How is this treated? Treatment for this condition depends on how severe your symptoms are. Treatment may include:  Using an over-the-counter vaginal lubricant before sex.  Using a long-acting vaginal moisturizer.  Using low-dose estrogen for moderate to severe symptoms that do not respond to other treatments. Options include creams, tablets, and inserts (vaginal rings). Before you use a vaginal estrogen, tell your health care provider if you have a history of: ? Breast cancer. ? Endometrial cancer. ? Blood clots. If you are not sexually active and your symptoms are very mild, you may not need treatment. Follow these instructions at home: Medicines  Take over-the-counter and prescription medicines only as told by your health care provider.  Do not use herbal or alternative medicines unless your health care provider says that you can.  Use over-the-counter creams, lubricants, or moisturizers for dryness only as  told by your health care provider. General instructions  If your atrophic vaginitis is caused by menopause, discuss all of your menopause symptoms and treatment options with your health care provider.  Do not douche.  Do not use products that can make your vagina dry. These include: ? Scented feminine sprays. ? Scented tampons. ? Scented soaps.  Vaginal sex can help to improve blood flow and elasticity of vaginal tissue. If you choose to have sex and it hurts, try using a water-soluble lubricant or moisturizer right before having sex. Contact a health care provider if:  Your discharge looks different than normal.  Your vagina has an unusual smell.  You have new symptoms.  Your symptoms do not improve with treatment.  Your symptoms get worse. Summary  Atrophic vaginitis is a condition in which the tissues that line the vagina become dry and thin. It is most common  in women who have stopped having regular menstrual periods (are in menopause).  Treatment options include using vaginal lubricants and low-dose vaginal estrogen.  Contact a health care provider if your vagina has an unusual smell, or if your symptoms get worse or do not improve after treatment. This information is not intended to replace advice given to you by your health care provider. Make sure you discuss any questions you have with your health care provider. Document Revised: 03/20/2020 Document Reviewed: 03/20/2020 Elsevier Patient Education  2021 [employer].

## 2021-02-24 NOTE — Assessment & Plan Note (Signed)
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  Discussed with patient that medication may be increased, even for short period, if she wishes.  She will think about this and let me know.  Recommend reaching out to me if she would like to discuss medication or counseling services further.  No signs of SI/HI/or self harm today. Patient praised for her selfless act and her strength through this difficult time.

## 2021-02-24 NOTE — Assessment & Plan Note (Signed)
Appears related to cystitis. No decrease ROM or edema noted today. Heating pad to lower back may help with pain.  F/U if worsening or not improving.

## 2021-02-24 NOTE — Assessment & Plan Note (Signed)
Symptoms and presentation consistent with recurrence of cystitis. Given post-menopausal status discussed vaginal atrophy as possible contributing factor.  She has been on Yuvafem in the past, but reports that she has not used it in the last few months.  Recommend increased hydration,  trial of once weekly Yuvafem use with daily Aquaphor to vaginal introitus to help with dryness to see if this helps with repeat cystitis. Will send urine for culture, unfortunately her last culture was not run. Will start cipro x 3d until culture results have returned given results of UA in office today.  F/U if symptoms worsen or fail to improve.
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
## 2021-02-24 NOTE — Assessment & Plan Note (Signed)
Cough two weeks after COVID virus with no signs of infection, shortness of breath, wheezing, or respiratory difficulty.  Cough can linger for several weeks after viral infection- recommend honey and warm tea to help sooth the throat. Avoid suppressing the cough, as this is the body's way of clearing the airways after the virus.  If symptoms worsen or signs of infection (fever, chills, nausea, vomiting, wheezing, shortness of breath) develop, please notify immediately.

## 2021-02-25 ENCOUNTER — Telehealth (HOSPITAL_BASED_OUTPATIENT_CLINIC_OR_DEPARTMENT_OTHER): Payer: Self-pay

## 2021-02-25 ENCOUNTER — Other Ambulatory Visit (INDEPENDENT_AMBULATORY_CARE_PROVIDER_SITE_OTHER): Payer: Medicare Other | Admitting: Nurse Practitioner

## 2021-02-25 ENCOUNTER — Encounter (HOSPITAL_BASED_OUTPATIENT_CLINIC_OR_DEPARTMENT_OTHER): Payer: Self-pay | Admitting: Nurse Practitioner

## 2021-02-25 DIAGNOSIS — N1832 Chronic kidney disease, stage 3b: Secondary | ICD-10-CM

## 2021-02-25 LAB — RENAL FUNCTION PANEL
Albumin: 4.3 g/dL (ref 3.8–4.8)
BUN/Creatinine Ratio: 27 (ref 12–28)
BUN: 34 mg/dL — ABNORMAL HIGH (ref 8–27)
CO2: 21 mmol/L (ref 20–29)
Calcium: 9.8 mg/dL (ref 8.7–10.3)
Chloride: 102 mmol/L (ref 96–106)
Creatinine, Ser: 1.24 mg/dL — ABNORMAL HIGH (ref 0.57–1.00)
Glucose: 113 mg/dL — ABNORMAL HIGH (ref 65–99)
Phosphorus: 3.9 mg/dL (ref 3.0–4.3)
Potassium: 5.3 mmol/L — ABNORMAL HIGH (ref 3.5–5.2)
Sodium: 138 mmol/L (ref 134–144)
eGFR: 48 mL/min/{1.73_m2} — ABNORMAL LOW (ref >59)

## 2021-02-25 LAB — POCT UA - MICROALBUMIN
Creatinine, POC: 200 mg/dL (ref 10–300)
Microalbumin Ur, POC: 80 mg/L — AB (ref <20)

## 2021-02-25 LAB — SPECIMEN STATUS REPORT

## 2021-02-25 NOTE — Telephone Encounter (Signed)
Results released by [name], AGNP.  Called patient to discuss lab results and recommendations.  She understands and is agreeable.  Instructed patient to contact the office with any questions or concerns.

## 2021-02-25 NOTE — Progress Notes (Signed)
Kidney function is worsening and potassium slightly elevated.  Recommend increased hydration and stopping lisinopril at this time.  Consider stopping metformin and changing diabetes medication if continues to decline.  Will need to recheck in 4 weeks and see if this has improved or stabilized.   Estimated Creatinine Clearance: 40.2 mL/min (A) (by C-G formula based on SCr of 1.24 mg/dL (H)).

## 2021-02-25 NOTE — Telephone Encounter (Signed)
-----   Message from Tollie Eth, NP sent at 02/25/2021 12:06 PM EDT ----- Kidney function is worsening and potassium slightly elevated.  Recommend increased hydration and stopping lisinopril at this time.  Consider stopping metformin and changing diabetes medication if continues to decline.  Will need to recheck in 4 weeks and see if this has improved or stabilized.   Estimated Creatinine Clearance: 40.2 mL/min (A) (by C-G formula based on SCr of 1.24 mg/dL (H)).

## 2021-02-25 NOTE — Telephone Encounter (Signed)
Patient wants a prescription for Yubafem sent to [facility] pharmacy on file.

## 2021-02-26 ENCOUNTER — Other Ambulatory Visit (HOSPITAL_BASED_OUTPATIENT_CLINIC_OR_DEPARTMENT_OTHER): Payer: Self-pay | Admitting: Nurse Practitioner

## 2021-02-26 DIAGNOSIS — N3090 Cystitis, unspecified without hematuria: Secondary | ICD-10-CM

## 2021-02-26 DIAGNOSIS — N952 Postmenopausal atrophic vaginitis: Secondary | ICD-10-CM

## 2021-02-26 MED ORDER — ESTRADIOL 10 MCG VA TABS: 1.0000 | ORAL_TABLET | VAGINAL | 11 refills | Status: DC

## 2021-02-27 ENCOUNTER — Telehealth (HOSPITAL_BASED_OUTPATIENT_CLINIC_OR_DEPARTMENT_OTHER): Payer: Self-pay

## 2021-02-27 NOTE — Telephone Encounter (Signed)
Report received from [facility] that culture is still in process.  At this time preliminary results reveal mixed urogenital flora less than 10,000 colonies/mL  It is indicated that microbiological testing is in process to rule out the presence of possible pathogens.  Called patient to inform her that culture results are not complete at this time. My recommendations are to continue increasing water intake and I will continue to monitor for the results over the weekend. Once results are received, patient will be contacted with an update. She is agreeable to the plan.   Discussed with patient if the culture is negative, we may need to send a referral to urology for further evaluation of her concerns. She expresses that she is still experiencing urinary urgency and some pressure, but no burning, itching, back pain, or blood in the urine.

## 2021-02-27 NOTE — Telephone Encounter (Signed)
Patient called in requesting status and results of urine culture Patient states she has finished her antibiotics that were prescribed at [facility] on 05/24 and is still have pelvic discomfort I advised patient that I would get a message to [name], AGNP and if there were any results or recommendations then someone from out office would contact her Patient emphasized discomfort and urge to obtain information stating "well its Friday and if I need more antibiotics I would like to get them before the holiday weekend"

## 2021-02-28 ENCOUNTER — Other Ambulatory Visit (HOSPITAL_BASED_OUTPATIENT_CLINIC_OR_DEPARTMENT_OTHER): Payer: Self-pay | Admitting: Nurse Practitioner

## 2021-02-28 DIAGNOSIS — B379 Candidiasis, unspecified: Secondary | ICD-10-CM

## 2021-02-28 LAB — URINE CULTURE

## 2021-02-28 MED ORDER — FLUCONAZOLE 150 MG PO TABS: 300.0000 mg | ORAL_TABLET | Freq: Every day | ORAL | 0 refills | Status: AC

## 2021-02-28 NOTE — Progress Notes (Signed)
VM left for patient explaining the culture results and recommended treatment. Sending fluconazole 300mg  daily for 14 days- plan to recheck urine with culture in 2 weeks to ensure cure. Please call patient when we open on Tuesday and schedule appt for June 10th or June 13th. Thank you

## 2021-03-05 ENCOUNTER — Telehealth (HOSPITAL_BASED_OUTPATIENT_CLINIC_OR_DEPARTMENT_OTHER): Payer: Self-pay

## 2021-03-05 NOTE — Telephone Encounter (Signed)
-----   Message from Tollie Eth, NP sent at 02/28/2021  6:07 PM EDT ----- VM left for patient explaining the culture results and recommended treatment. Sending fluconazole 300mg  daily for 14 days- plan to recheck urine with culture in 2 weeks to ensure cure. Please call patient when we open on Tuesday and schedule appt for June 10th or June 13th. Thank you

## 2021-03-05 NOTE — Telephone Encounter (Signed)
Called patient to schedule follow up appointment for recent infection.  Her appointment is 03/16/21 at 10:10 am.

## 2021-03-09 ENCOUNTER — Ambulatory Visit (HOSPITAL_BASED_OUTPATIENT_CLINIC_OR_DEPARTMENT_OTHER): Payer: Medicare Other | Attending: Nurse Practitioner | Admitting: Physical Therapy

## 2021-03-09 ENCOUNTER — Other Ambulatory Visit: Payer: Self-pay

## 2021-03-09 ENCOUNTER — Encounter (HOSPITAL_BASED_OUTPATIENT_CLINIC_OR_DEPARTMENT_OTHER): Payer: Self-pay | Admitting: Physical Therapy

## 2021-03-09 DIAGNOSIS — M25511 Pain in right shoulder: Secondary | ICD-10-CM | POA: Insufficient documentation

## 2021-03-09 DIAGNOSIS — G8929 Other chronic pain: Secondary | ICD-10-CM | POA: Diagnosis not present

## 2021-03-09 DIAGNOSIS — M25611 Stiffness of right shoulder, not elsewhere classified: Secondary | ICD-10-CM | POA: Diagnosis not present

## 2021-03-09 NOTE — Therapy (Signed)
[facility] [address] [place], [place], [address] Phone: [phone]   Fax:  [phone]  Physical Therapy Evaluation  Patient Details  Name: Bianca Phillips MRN: [id number] Date of Birth: 1956/09/22 Referring Provider (PT): Enid Skeens, NP   Encounter Date: 03/09/2021   PT End of Session - 03/09/21 1111    Visit Number 1    Number of Visits 4    Date for PT Re-Evaluation 04/24/21    Authorization Type BCBS, MCR    Progress Note Due on Visit 10    PT Start Time 1101    PT Stop Time 1140    PT Time Calculation (min) 39 min    Activity Tolerance Patient tolerated treatment well    Behavior During Therapy [facility] for tasks assessed/performed           Past Medical History:  Diagnosis Date  . Allergy   . Anxiety   . Arthritis   . Depression   . Diabetes mellitus without complication (HCC)   . Encounter to establish care 02/05/2021  . Hyperlipidemia   . Thyroid disease     History reviewed. No pertinent surgical history.  There were no vitals filed for this visit.    Subjective Assessment - 03/09/21 1111    Subjective Not really any pain in the shoulder, it just feels tight    Patient Stated Goals incr ROM- wash back    Currently in Pain? No/denies              [facility] PT Assessment - 03/09/21 0001      Assessment   Medical Diagnosis Rt shoulder adhesive capsulitis    Referring Provider (PT) Enid Skeens, NP    Onset Date/Surgical Date --   about 1 year ago   Hand Dominance Right    Prior Therapy yes, in [place]      Precautions   Precautions None      Restrictions   Weight Bearing Restrictions No      Balance Screen   Has the patient fallen in the past 6 months Yes    How many times? 1    Has the patient had a decrease in activity level because of a fear of falling?  No    Is the patient reluctant to leave their home because of a fear of falling?  No      Home Public house manager residence    Living Arrangements Children      Prior Function   Level of Independence Independent    Vocation Retired      IT consultant   Overall Cognitive Status Within Functional Limits for tasks assessed      Sensation   Additional Comments WFL      Posture/Postural Control   Posture Comments Rt shoulder elevation      ROM / Strength   AROM / PROM / Strength AROM      AROM   AROM Assessment Site Shoulder    Right/Left Shoulder Right    Right Shoulder Flexion 122 Degrees    Right Shoulder ABduction 130 Degrees    Right Shoulder Internal Rotation --   midline L2 if she moves quickly/assists   Right Shoulder External Rotation --   midline T2     Palpation   Palpation comment tightness Rt upper trap                      Objective measurements completed on  examination: See above findings.       [facility] Adult PT Treatment/Exercise - 03/09/21 0001      Exercises   Exercises Shoulder      Shoulder Exercises: Prone   Other Prone Exercises qped protraction/retraction      Shoulder Exercises: Stretch   Cross Chest Stretch Limitations thread the needle    Internal Rotation Stretch Limitations sleeper stretch    Table Stretch - ABduction Limitations T pec stretch    Table Stretch - External Rotation Limitations open book, supine W    Other Shoulder Stretches wall: flx, scaption, abd slides    Other Shoulder Stretches child pose- center & lateral                  PT Education - 03/09/21 1307    Education Details anatomy of condition, POC, HEP, exercise form/rationale    Person(s) Educated Patient    Methods Explanation;Demonstration;Tactile cues;Verbal cues;Handout    Comprehension Verbalized understanding;Returned demonstration;Verbal cues required;Tactile cues required;Need further instruction            PT Short Term Goals - 03/09/21 1303      PT SHORT TERM GOAL #1   Title performing some mobilization exercises every day    Baseline  none at eval    Time 3    Period Weeks    Status New    Target Date 04/03/21             PT Long Term Goals - 03/09/21 1301      PT LONG TERM GOAL #1   Title able to wash her back with Rt hand without throwing her arm/assisting it up her back    Baseline unable at eval    Time 6    Period Weeks    Status New    Target Date 04/24/21      PT LONG TERM GOAL #2   Title able to demo MMT without shoulder hike    Baseline Rt shoulder hike in all motions with tightness in upper trap    Time 6    Period Weeks    Status New    Target Date 04/24/21      PT LONG TERM GOAL #3   Title Flx & abd ROM within 10 deg of Lt    Baseline see flowsheet    Time 6    Period Weeks    Status New    Target Date 04/24/21                  Plan - 03/09/21 1136    Clinical Impression Statement Pt presents to PT with a 1 year h/o Rt adhesive capsulitis that is not painful but stiff and tight. Pt recently moved from [place] and admits she did not keep up with PT exercises quite like she should have. It appears she was doing mostly resisted band strengthening so I focused on mobility exercises and stretches for the shoulder with emphasis on scapular mobility and control. Overall she has good strength but does compensate with upper trap via shoulder elevation in resisted motions. Considering her timeline for the shoulder as well as financial considerations, pt will do 3 more visits every other week to focus on shoulder mobility and will increase her HEP performance at home.    Personal Factors and Comorbidities Comorbidity 2    Comorbidities DM, moving away from stressful situation    Examination-Activity Limitations Bathing;Reach Overhead;Carry;Dressing    Examination-Participation Restrictions Cleaning;Meal Prep  Stability/Clinical Decision Making Stable/Uncomplicated    Clinical Decision Making Low    Rehab Potential Good    PT Frequency Biweekly    PT Duration 6 weeks    PT  Treatment/Interventions ADLs/Self Care Home Management;Cryotherapy;Electrical Stimulation;Iontophoresis 4mg /ml Dexamethasone;Ultrasound;Moist Heat;Therapeutic activities;Therapeutic exercise;Neuromuscular re-education;Patient/family education;Passive range of motion;Manual techniques;Dry needling;Taping;Joint Manipulations    PT Next Visit Plan cont to progress mobility exercises    PT Home Exercise Plan [id number]    Consulted and Agree with Plan of Care Patient           Patient will benefit from skilled therapeutic intervention in order to improve the following deficits and impairments:  Decreased range of motion,Increased muscle spasms,Impaired flexibility  Visit Diagnosis: Chronic right shoulder pain - Plan: PT plan of care cert/re-cert  Stiffness of right shoulder, not elsewhere classified - Plan: PT plan of care cert/re-cert     Problem List Patient Active Problem List   Diagnosis Date Noted  . Cystitis 02/24/2021  . Acute midline low back pain without sciatica 02/24/2021  . 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  . Tachycardia 02/05/2021  . Itching in the vaginal area 02/05/2021  . Generalized anxiety disorder 02/05/2021  . Depression, major, single episode, mild (HCC) 02/05/2021  . Heart palpitations 02/05/2021  . Adhesive capsulitis of right shoulder 02/05/2021  . Urinary frequency 02/05/2021  . Type 2 diabetes mellitus with hyperglycemia, without long-term current use of insulin (HCC) 02/05/2021  . Postoperative hypothyroidism 02/05/2021  . Hyperlipidemia associated with type 2 diabetes mellitus (HCC) 02/05/2021  . Intractable migraine without aura and without status migrainosus 02/05/2021  . Seasonal allergic rhinitis due to pollen 02/05/2021  . Vision changes 02/05/2021    Bessy Reaney C. Loy Little PT, DPT 03/09/21 1:10 PM   [facility] [address] [place], [place], [place] Phone: [phone]   Fax:   [phone]  Name: Bianca Phillips MRN: [name] Date of Birth: [birth year]

## 2021-03-16 ENCOUNTER — Encounter (HOSPITAL_BASED_OUTPATIENT_CLINIC_OR_DEPARTMENT_OTHER): Payer: Self-pay | Admitting: Nurse Practitioner

## 2021-03-16 ENCOUNTER — Ambulatory Visit (INDEPENDENT_AMBULATORY_CARE_PROVIDER_SITE_OTHER): Payer: Medicare Other | Admitting: Nurse Practitioner

## 2021-03-16 ENCOUNTER — Other Ambulatory Visit: Payer: Self-pay

## 2021-03-16 ENCOUNTER — Other Ambulatory Visit (HOSPITAL_BASED_OUTPATIENT_CLINIC_OR_DEPARTMENT_OTHER): Payer: Self-pay | Admitting: Nurse Practitioner

## 2021-03-16 VITALS — BP 140/60 | HR 100 | Ht 65.0 in | Wt 141.2 lb

## 2021-03-16 DIAGNOSIS — E782 Mixed hyperlipidemia: Secondary | ICD-10-CM

## 2021-03-16 DIAGNOSIS — E1165 Type 2 diabetes mellitus with hyperglycemia: Secondary | ICD-10-CM | POA: Diagnosis not present

## 2021-03-16 DIAGNOSIS — B379 Candidiasis, unspecified: Secondary | ICD-10-CM | POA: Diagnosis not present

## 2021-03-16 DIAGNOSIS — N1832 Chronic kidney disease, stage 3b: Secondary | ICD-10-CM | POA: Insufficient documentation

## 2021-03-16 LAB — POCT URINALYSIS DIPSTICK
Bilirubin, UA: NEGATIVE
Glucose, UA: NEGATIVE
Ketones, UA: NEGATIVE
Nitrite, UA: NEGATIVE
Protein, UA: NEGATIVE
Spec Grav, UA: 1.025 (ref 1.010–1.025)
Urobilinogen, UA: 0.2 E.U./dL
pH, UA: 5.5 (ref 5.0–8.0)

## 2021-03-16 MED ORDER — ATORVASTATIN CALCIUM 40 MG PO TABS: 40.0000 mg | ORAL_TABLET | Freq: Every day | ORAL | 1 refills | Status: DC

## 2021-03-16 NOTE — Patient Instructions (Addendum)
We will recheck your urine today to make sure the infection is gone.  We will also recheck your kidney function to make sure that it looks ok- if there is a decline in kidney function, we may need to send you to nephrology for evaluation, but I will wait on the results before we make that decision.   We will plan if we need to follow-up once we get the results back .

## 2021-03-16 NOTE — Assessment & Plan Note (Signed)
Labs today for monitoring Lisinopril stopped at last visit Recently completed treatment for fungal UTI infection.

## 2021-03-16 NOTE — Assessment & Plan Note (Signed)
Repeat urinalysis and urine culture and sensitivity testing today for evaluation for resolution of infection.  Will also repeat kidney function labs as these have been abnormal in the recent past.  Will make changes to plan of care based on lab results and change treatment, if infection persists.  Consider nephrology referral if kidney function remains abnormal.  F/U TBD with results.

## 2021-03-16 NOTE — Progress Notes (Signed)
Established Patient Office Visit  Subjective:  Patient ID: Bianca Phillips, female    DOB: 07/05/1956  Age: 65 y.o. MRN: [id number]  CC:  Chief Complaint  Patient presents with   Follow-up    HPI Bianca Phillips presents for follow-up for UTI positive for Candida lusitaniae approximately 2 weeks ago. She was treated with oral fluconazole 343m per day for 14 days.  Today she reports symptoms have improved. She denies back pain, suprapubic pain/pressure, urinary frequency, urinary urgency, fevers, chills, body aches, nausea, vomiting, and/or diarrhea.  She has completed the medication.   Diabetes She endorses concern about metformin and her kidney function. She has stopped low dose lisinopril- BP is usually 1291-916systolic since stopping. CBG 120-140 in the mornings when fasting.  Has been monitoring sugar in her diet, but not aware of need to monitor overall carbohydrates.  Daily exercise with walking and in structured exercise class 3-4 days per week.   Past Medical History:  Diagnosis Date   Allergy    Anxiety    Arthritis    Depression    Diabetes mellitus without complication ([place]    Encounter to establish care 02/05/2021   Hyperlipidemia    Thyroid disease     History reviewed. No pertinent surgical history.  History reviewed. No pertinent family history.  Social History   Socioeconomic History   Marital status: Divorced    Spouse name: Not on file   Number of children: 3   Years of education: 16   Highest education level: Bachelor's degree (e.g., BA, AB, BS)  Occupational History   Occupation: retired    Comment: tPharmacist, hospital Tobacco Use   Smoking status: Former    Packs/day: 0.50    Years: 10.00    Pack years: 5.00    Types: Cigarettes   Smokeless tobacco: Never  Vaping Use   Vaping Use: Never used  Substance and Sexual Activity   Alcohol use: Yes    Alcohol/week: 1.0 standard drink    Types: 1 Standard drinks or equivalent per week   Drug use: Never    Sexual activity: Not Currently    Birth control/protection: Post-menopausal, Abstinence, Surgical  Other Topics Concern   Not on file  Social History Narrative   Not on file   Social Determinants of Health   Financial Resource Strain: Not on file  Food Insecurity: Not on file  Transportation Needs: Not on file  Physical Activity: Not on file  Stress: Not on file  Social Connections: Not on file  Intimate Partner Violence: Not on file    Outpatient Medications Prior to Visit  Medication Sig Dispense Refill   ACCU-CHEK AVIVA PLUS test strip daily.     Alpha-Lipoic Acid 600 MG TABS Take by mouth.     ALPRAZolam (XANAX) 0.25 MG tablet Take 0.25 mg by mouth at bedtime as needed for anxiety.     Ascorbic Acid (VITAMIN C) 1000 MG tablet Take 1,000 mg by mouth daily.     aspirin 81 MG chewable tablet Chew by mouth daily.     atorvastatin (LIPITOR) 40 MG tablet Take 1 tablet (40 mg total) by mouth at bedtime. 90 tablet 1   cetirizine (ZYRTEC) 5 MG tablet Take 5 mg by mouth daily.     cholecalciferol (VITAMIN D3) 25 MCG (1000 UNIT) tablet Take 1,000 Units by mouth daily.     Coenzyme Q10-Fish Oil-Vit E (CO-Q 10 OMEGA-3 FISH OIL PO) Take by mouth.     Estradiol (YUVAFEM) 10 MCG  TABS vaginal tablet Place 1 tablet (10 mcg total) vaginally once a week. 4 tablet 11   ferrous sulfate 324 MG TBEC Take 324 mg by mouth.     fluticasone (FLONASE) 50 MCG/ACT nasal spray Place into both nostrils daily.     levothyroxine (SYNTHROID) 150 MCG tablet Take 150 mcg by mouth daily before breakfast.     meclizine (ANTIVERT) 12.5 MG tablet Take 12.5 mg by mouth every 6 (six) hours as needed.     metFORMIN (GLUCOPHAGE) 500 MG tablet Take 1 tablet (500 mg total) by mouth 2 (two) times daily with a meal. 180 tablet 1   Misc Natural Products (GLUCOSAMINE CHOND CMP ADVANCED PO) Take by mouth 2 (two) times daily.     nortriptyline (PAMELOR) 50 MG capsule Take 50 mg by mouth at bedtime.     sertraline (ZOLOFT) 50  MG tablet Take 50 mg by mouth daily.     Turmeric 500 MG CAPS Take by mouth.     ciprofloxacin (CIPRO) 500 MG tablet Take 1 tablet (500 mg total) by mouth 2 (two) times daily. 6 tablet 0   No facility-administered medications prior to visit.    Allergies  Allergen Reactions   Curare [Tubocurarine]     ROS Review of Systems All review of systems negative except what is listed in the HPI    Objective:    Physical Exam Vitals and nursing note reviewed.  Constitutional:      Appearance: Normal appearance. She is normal weight.  HENT:     Head: Normocephalic.  Eyes:     Extraocular Movements: Extraocular movements intact.     Conjunctiva/sclera: Conjunctivae normal.     Pupils: Pupils are equal, round, and reactive to light.  Cardiovascular:     Rate and Rhythm: Normal rate and regular rhythm.     Pulses: Normal pulses.     Heart sounds: Normal heart sounds.  Pulmonary:     Effort: Pulmonary effort is normal.     Breath sounds: Normal breath sounds.  Abdominal:     General: Abdomen is flat. Bowel sounds are normal. There is no distension.     Palpations: Abdomen is soft.     Tenderness: There is no abdominal tenderness. There is no right CVA tenderness, left CVA tenderness, guarding or rebound.  Musculoskeletal:     Cervical back: Normal range of motion and neck supple.     Right lower leg: No edema.     Left lower leg: No edema.  Skin:    General: Skin is warm and dry.     Capillary Refill: Capillary refill takes less than 2 seconds.  Neurological:     General: No focal deficit present.     Mental Status: She is alert and oriented to person, place, and time.  Psychiatric:        Mood and Affect: Mood normal.        Behavior: Behavior normal.        Thought Content: Thought content normal.        Judgment: Judgment normal.    BP 140/60   Pulse 100   Ht '5\' 5"'  (1.651 m)   Wt 141 lb 3.2 oz (64 kg)   BMI 23.50 kg/m  Wt Readings from Last 3 Encounters:  03/16/21  141 lb 3.2 oz (64 kg)  02/24/21 144 lb 6.4 oz (65.5 kg)  02/11/21 145 lb 6.4 oz (66 kg)     Health Maintenance Due  Topic Date Due  COVID-19 Vaccine (1) Never done   FOOT EXAM  Never done   OPHTHALMOLOGY EXAM  Never done   Hepatitis C Screening  Never done   TETANUS/TDAP  Never done   COLONOSCOPY (Pts 45-23yr Insurance coverage will need to be confirmed)  Never done   MAMMOGRAM  Never done   Zoster Vaccines- Shingrix (1 of 2) Never done   DEXA SCAN  Never done   PNA vac Low Risk Adult (1 of 2 - PCV13) Never done    There are no preventive care reminders to display for this patient.  Lab Results  Component Value Date   TSH 3.090 02/06/2021   Lab Results  Component Value Date   WBC 13.2 (H) 02/06/2021   HGB 13.8 02/06/2021   HCT 43.6 02/06/2021   MCV 90.8 02/06/2021   PLT 308 02/06/2021   Lab Results  Component Value Date   NA 138 02/24/2021   K 5.3 (H) 02/24/2021   CO2 21 02/24/2021   GLUCOSE 113 (H) 02/24/2021   BUN 34 (H) 02/24/2021   CREATININE 1.24 (H) 02/24/2021   BILITOT 0.4 02/06/2021   ALKPHOS 78 02/06/2021   AST 20 02/06/2021   ALT 24 02/06/2021   PROT 7.5 02/06/2021   ALBUMIN 4.3 02/24/2021   CALCIUM 9.8 02/24/2021   ANIONGAP 11 02/06/2021   EGFR 48 (L) 02/24/2021   Lab Results  Component Value Date   CHOL 163 02/06/2021   Lab Results  Component Value Date   HDL 48 02/06/2021   Lab Results  Component Value Date   LDLCALC 82 02/06/2021   Lab Results  Component Value Date   TRIG 163 (H) 02/06/2021   Lab Results  Component Value Date   CHOLHDL 3.4 02/06/2021   Lab Results  Component Value Date   HGBA1C 6.6 (H) 02/06/2021      Assessment & Plan:   Problem List Items Addressed This Visit     Type 2 diabetes mellitus with hyperglycemia, without long-term current use of insulin ([place]    Diabetes education provided today with recommendations for dietary measures to help reduce blood sugars.  Will await results of kidney function  testing prior to determining if metformin needs to be changed to alternaive medication for blood sugar management.  Lisinopril stopped at last visit- BP slightly elevated in the office today with no diagnosis of HTN- may need to consider low dose alternative for BP if this continues.         Candida lusitanea infection - Primary    Repeat urinalysis and urine culture and sensitivity testing today for evaluation for resolution of infection.  Will also repeat kidney function labs as these have been abnormal in the recent past.  Will make changes to plan of care based on lab results and change treatment, if infection persists.  Consider nephrology referral if kidney function remains abnormal.  F/U TBD with results.        Relevant Orders   Basic metabolic panel   POCT urinalysis dipstick (Completed)   Urine Culture   Chronic kidney disease (CKD) stage G3b/A2, moderately decreased glomerular filtration rate (GFR) between 30-44 mL/min/1.73 square meter and albuminuria creatinine ratio between 30-299 mg/g ([place]    Labs today for monitoring Lisinopril stopped at last visit Recently completed treatment for fungal UTI infection.          No orders of the defined types were placed in this encounter.   Follow-up: Return if symptoms worsen or fail to improve, for TBD  based on labs.    Orma Render, NP

## 2021-03-16 NOTE — Assessment & Plan Note (Signed)
Diabetes education provided today with recommendations for dietary measures to help reduce blood sugars.  Will await results of kidney function testing prior to determining if metformin needs to be changed to alternaive medication for blood sugar management.  Lisinopril stopped at last visit- BP slightly elevated in the office today with no diagnosis of HTN- may need to consider low dose alternative for BP if this continues.

## 2021-03-20 LAB — URINE CULTURE

## 2021-03-20 LAB — BASIC METABOLIC PANEL
BUN/Creatinine Ratio: 24 (ref 12–28)
BUN: 27 mg/dL (ref 8–27)
CO2: 19 mmol/L — ABNORMAL LOW (ref 20–29)
Calcium: 9.4 mg/dL (ref 8.7–10.3)
Chloride: 99 mmol/L (ref 96–106)
Creatinine, Ser: 1.13 mg/dL — ABNORMAL HIGH (ref 0.57–1.00)
Glucose: 181 mg/dL — ABNORMAL HIGH (ref 65–99)
Potassium: 4.6 mmol/L (ref 3.5–5.2)
Sodium: 137 mmol/L (ref 134–144)
eGFR: 53 mL/min/{1.73_m2} — ABNORMAL LOW (ref >59)

## 2021-03-23 ENCOUNTER — Telehealth (HOSPITAL_BASED_OUTPATIENT_CLINIC_OR_DEPARTMENT_OTHER): Payer: Self-pay

## 2021-03-23 ENCOUNTER — Ambulatory Visit (HOSPITAL_BASED_OUTPATIENT_CLINIC_OR_DEPARTMENT_OTHER): Payer: Medicare Other | Admitting: Physical Therapy

## 2021-03-23 ENCOUNTER — Other Ambulatory Visit (HOSPITAL_BASED_OUTPATIENT_CLINIC_OR_DEPARTMENT_OTHER): Payer: Self-pay | Admitting: Nurse Practitioner

## 2021-03-23 DIAGNOSIS — N390 Urinary tract infection, site not specified: Secondary | ICD-10-CM

## 2021-03-23 MED ORDER — NITROFURANTOIN MONOHYD MACRO 100 MG PO CAPS: 100.0000 mg | ORAL_CAPSULE | Freq: Two times a day (BID) | ORAL | 0 refills | Status: DC

## 2021-03-23 NOTE — Progress Notes (Signed)
Please call the patient: Her kidney function looks a little better!  Urine culture shows positive for E-Coli. I have sent nitrofurantoin in for her to take for 5 days. We will not plan to retest unless she has symptoms as she may be a carrier.   If she begins to have symptoms after the antibiotic is finished, please have her to call the office for an appt

## 2021-03-23 NOTE — Telephone Encounter (Signed)
Patient states she had a urine culture last week.  This morning she wakes up to new medication sent in to the pharmacy without a call from [place] to let her know what's going on  She wants a call back with results from her urine culture and answers to what is going on.

## 2021-03-23 NOTE — Telephone Encounter (Signed)
Spoke with patient via telephone to help explain result process. I informed the patient that result notes are sent to the nurse to call the patient and also sent directly to the patient if MyChart is active. When I send the note, I will also send the prescription to the pharmacy to prevent any delays in obtaining the medications. Patient reports that she has not been able to get into MyChart on her phone, but her daughter has been able to get in via computer. She will try to log in on her phone again in order to receive MyChart messages.   I gave the patient the option to have prescriptions delayed until she has spoken with the nurse, but she requests to have them sent immediately, as they have been, in the future to prevent delay.

## 2021-03-25 ENCOUNTER — Telehealth (HOSPITAL_BASED_OUTPATIENT_CLINIC_OR_DEPARTMENT_OTHER): Payer: Self-pay

## 2021-03-25 NOTE — Telephone Encounter (Signed)
[name], AGNP called patient to discussed lab results and recommendations. Instructed patient to contact the office with any questions or concerns.

## 2021-03-25 NOTE — Telephone Encounter (Signed)
-----   Message from Tollie Eth, NP sent at 03/23/2021  8:19 AM EDT ----- Please call the patient: Her kidney function looks a little better!  Urine culture shows positive for E-Coli. I have sent nitrofurantoin in for her to take for 5 days. We will not plan to retest unless she has symptoms as she may be a carrier.   If she begins to have symptoms after the antibiotic is finished, please have her to call the office for an appt

## 2021-04-01 ENCOUNTER — Ambulatory Visit (HOSPITAL_BASED_OUTPATIENT_CLINIC_OR_DEPARTMENT_OTHER): Payer: Medicare Other | Admitting: Physical Therapy

## 2021-04-01 ENCOUNTER — Encounter (HOSPITAL_BASED_OUTPATIENT_CLINIC_OR_DEPARTMENT_OTHER): Payer: Self-pay | Admitting: Physical Therapy

## 2021-04-01 ENCOUNTER — Other Ambulatory Visit: Payer: Self-pay

## 2021-04-01 DIAGNOSIS — M25611 Stiffness of right shoulder, not elsewhere classified: Secondary | ICD-10-CM | POA: Diagnosis not present

## 2021-04-01 DIAGNOSIS — M25511 Pain in right shoulder: Secondary | ICD-10-CM | POA: Diagnosis not present

## 2021-04-01 DIAGNOSIS — G8929 Other chronic pain: Secondary | ICD-10-CM | POA: Diagnosis not present

## 2021-04-01 NOTE — Therapy (Addendum)
[facility] [address] [place], [place], [address] Phone: [phone]   Fax:  [phone]  Physical Therapy Treatment  Patient Details  Name: Bianca Phillips MRN: [id number] Date of Birth: 01/19/1956 Referring Provider (PT): Jacolyn Reedy, NP   Encounter Date: 04/01/2021   PT End of Session - 04/01/21 1519     Visit Number 2    Number of Visits 4    Date for PT Re-Evaluation 04/24/21    Authorization Type BCBS, MCR    Progress Note Due on Visit 10    PT Start Time 9798    PT Stop Time 1553    PT Time Calculation (min) 38 min    Activity Tolerance Patient tolerated treatment well    Behavior During Therapy [facility] for tasks assessed/performed             Past Medical History:  Diagnosis Date   Allergy    Anxiety    Arthritis    Depression    Diabetes mellitus without complication ([place])    Encounter to establish care 02/05/2021   Hyperlipidemia    Thyroid disease     History reviewed. No pertinent surgical history.  There were no vitals filed for this visit.   Subjective Assessment - 04/01/21 1517     Subjective I am doing water aerobics and zumba and using it all the time. I have been doing the exercises.    Currently in Pain? No/denies                [facility] PT Assessment - 04/01/21 0001       AROM   Right Shoulder Flexion 148 Degrees                           [facility] Adult PT Treatment/Exercise - 04/01/21 0001       Shoulder Exercises: Prone   Other Prone Exercises qped shoulder flexion      Shoulder Exercises: Standing   Other Standing Exercises overhead protraction reach, rolling ball on wall      Manual Therapy   Manual therapy comments GHJ stretching, STM pecs, subscap                      PT Short Term Goals - 04/01/21 1547       PT SHORT TERM GOAL #1   Title performing some mobilization exercises every day    Status Achieved               PT Long  Term Goals - 03/09/21 1301       PT LONG TERM GOAL #1   Title able to wash her back with Rt hand without throwing her arm/assisting it up her back    Baseline unable at eval    Time 6    Period Weeks    Status New    Target Date 04/24/21      PT LONG TERM GOAL #2   Title able to demo MMT without shoulder hike    Baseline Rt shoulder hike in all motions with tightness in upper trap    Time 6    Period Weeks    Status New    Target Date 04/24/21      PT LONG TERM GOAL #3   Title Flx & abd ROM within 10 deg of Lt    Baseline see flowsheet    Time 6    Period Weeks  Status New    Target Date 04/24/21                   Plan - 04/01/21 2005     Clinical Impression Statement Updated HEP to encourage overhead reach and retrain strength in ranges. Active motion has increased since the last time I saw her and denies pain. She will follow up in about 4 weeks.    PT Treatment/Interventions ADLs/Self Care Home Management;Cryotherapy;Electrical Stimulation;Iontophoresis 45m/ml Dexamethasone;Ultrasound;Moist Heat;Therapeutic activities;Therapeutic exercise;Neuromuscular re-education;Patient/family education;Passive range of motion;Manual techniques;Dry needling;Taping;Joint Manipulations    PT Home Exercise Plan [id number]    Consulted and Agree with Plan of Care Patient             Patient will benefit from skilled therapeutic intervention in order to improve the following deficits and impairments:  Decreased range of motion, Increased muscle spasms, Impaired flexibility  Visit Diagnosis: Chronic right shoulder pain  Stiffness of right shoulder, not elsewhere classified     Problem List Patient Active Problem List   Diagnosis Date Noted   Candida lusitanea infection 03/16/2021   Chronic kidney disease (CKD) stage G3b/A2, moderately decreased glomerular filtration rate (GFR) between 30-44 mL/min/1.73 square meter and albuminuria creatinine ratio between 30-299 mg/g  ([place] 03/16/2021   Cystitis 02/24/2021   Acute midline low back pain without sciatica 02/24/2021   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   Tachycardia 02/05/2021   Itching in the vaginal area 02/05/2021   Generalized anxiety disorder 02/05/2021   Depression, major, single episode, mild ([place] 02/05/2021   Heart palpitations 02/05/2021   Adhesive capsulitis of right shoulder 02/05/2021   Urinary frequency 02/05/2021   Type 2 diabetes mellitus with hyperglycemia, without long-term current use of insulin ([place] 02/05/2021   Postoperative hypothyroidism 02/05/2021   Hyperlipidemia associated with type 2 diabetes mellitus ([place] 02/05/2021   Intractable migraine without aura and without status migrainosus 02/05/2021   Seasonal allergic rhinitis due to pollen 02/05/2021   Vision changes 02/05/2021  Tanay Misuraca C. Shylynn Bruning PT, DPT 04/01/21 8:26 PM  04/01/2021, 8:26 PM  [facility] [address] [address]Phone: [phone]  Fax:  [phone] Name: Bianca FreeburgMRN: [id number] of Birth: 503/24/1957 PHYSICAL THERAPY DISCHARGE SUMMARY  Visits from Start of Care: 2  Current functional level related to goals / functional outcomes: See above   Remaining deficits: See above   Education / Equipment: Anatomy of condition, POC, HEP exercise form/rationale   Patient agrees to discharge. Patient goals were not met. Patient is being discharged due to not returning since the last visit. Ramisa Duman C. Audrea Bolte PT, DPT 06/05/21 4:36 PM

## 2021-04-01 NOTE — Progress Notes (Signed)
Cardiology Office Note:    Date:  04/03/2021   ID:  [name], DOB [birth year], MRN [id number]  PCP:  Tollie Eth, NP   [facility] Providers Cardiologist:  None {   Referring MD: Tollie Eth, NP    History of Present Illness:    Bianca Phillips is a 65 y.o. female with a hx of depression, DMII and HLD who was referred by Enid Skeens, NP for further evaluation of palpitations.  Today, the patient states that she has been having episodes of rapid heart rates up to 100s while at rest. Occasionally feels palpitations with this. No associated shortness of breath, lightheadedness or dizziness. Symptoms have been ongoing for several months with no known triggers. Otherwise, she is overall very active and able to do water aerobics, dance class and swim without palpitations or lightheadedness No anginal symptoms with activity. Blood pressure is overall well controlled at home. Has occasional dizziness with position changes, but no syncope.  Father with history of valve disease. Brother with palpitations.   Past Medical History:  Diagnosis Date   Allergy    Anxiety    Arthritis    Depression    Diabetes mellitus without complication (HCC)    Encounter to establish care 02/05/2021   Hyperlipidemia    Thyroid disease     No past surgical history on file.  Current Medications: Current Meds  Medication Sig   ACCU-CHEK AVIVA PLUS test strip daily.   Alpha-Lipoic Acid 600 MG TABS Take by mouth.   ALPRAZolam (XANAX) 0.25 MG tablet Take 0.25 mg by mouth at bedtime as needed for anxiety.   Ascorbic Acid (VITAMIN C) 1000 MG tablet Take 1,000 mg by mouth daily.   aspirin 81 MG chewable tablet Chew by mouth daily.   atorvastatin (LIPITOR) 40 MG tablet Take 1 tablet (40 mg total) by mouth at bedtime.   cetirizine (ZYRTEC) 5 MG tablet Take 5 mg by mouth daily.   cholecalciferol (VITAMIN D3) 25 MCG (1000 UNIT) tablet Take 1,000 Units by mouth daily.   ciprofloxacin (CIPRO) 500 MG  tablet Take 1 tablet (500 mg total) by mouth 2 (two) times daily for 7 days.   Coenzyme Q10-Fish Oil-Vit E (CO-Q 10 OMEGA-3 FISH OIL PO) Take by mouth.   ferrous sulfate 324 MG TBEC Take 324 mg by mouth.   fluticasone (FLONASE) 50 MCG/ACT nasal spray Place into both nostrils daily.   levothyroxine (SYNTHROID) 150 MCG tablet Take 150 mcg by mouth daily before breakfast.   meclizine (ANTIVERT) 12.5 MG tablet Take 12.5 mg by mouth every 6 (six) hours as needed.   metFORMIN (GLUCOPHAGE) 500 MG tablet Take 1 tablet (500 mg total) by mouth 2 (two) times daily with a meal.   Misc Natural Products (GLUCOSAMINE CHOND CMP ADVANCED PO) Take by mouth 2 (two) times daily.   nortriptyline (PAMELOR) 50 MG capsule Take 50 mg by mouth at bedtime.   sertraline (ZOLOFT) 50 MG tablet Take 50 mg by mouth daily.   Turmeric 500 MG CAPS Take by mouth.     Allergies:   Curare [tubocurarine]   Social History   Socioeconomic History   Marital status: Divorced    Spouse name: Not on file   Number of children: 3   Years of education: 16   Highest education level: Bachelor's degree (e.g., BA, AB, BS)  Occupational History   Occupation: retired    Comment: Runner, broadcasting/film/video  Tobacco Use   Smoking status: Former    Packs/day: 0.50  Years: 10.00    Pack years: 5.00    Types: Cigarettes   Smokeless tobacco: Never  Vaping Use   Vaping Use: Never used  Substance and Sexual Activity   Alcohol use: Yes    Alcohol/week: 1.0 standard drink    Types: 1 Standard drinks or equivalent per week   Drug use: Never   Sexual activity: Not Currently    Birth control/protection: Post-menopausal, Abstinence, Surgical  Other Topics Concern   Not on file  Social History Narrative   Not on file   Social Determinants of Health   Financial Resource Strain: Not on file  Food Insecurity: Not on file  Transportation Needs: Not on file  Physical Activity: Not on file  Stress: Not on file  Social Connections: Not on file      Family History: Father with history of valve disease. Brother with palpitations.   ROS:   Please see the history of present illness.    Review of Systems  Constitutional:  Negative for chills, fever and malaise/fatigue.  HENT:  Negative for sore throat.   Eyes:  Negative for blurred vision.  Respiratory:  Negative for shortness of breath.   Cardiovascular:  Positive for palpitations. Negative for chest pain, orthopnea, claudication, leg swelling and PND.  Gastrointestinal:  Negative for nausea and vomiting.  Genitourinary:  Negative for flank pain.  Musculoskeletal:  Negative for falls.  Neurological:  Positive for dizziness. Negative for loss of consciousness.  Psychiatric/Behavioral:  Negative for substance abuse.     EKGs/Labs/Other Studies Reviewed:    The following studies were reviewed today: No cardiac studies  EKG:  EKG is  ordered today.  The ekg ordered today demonstrates NSR with HR 86  Recent Labs: 02/06/2021: ALT 24; Hemoglobin 13.8; Platelets 308; TSH 3.090 03/16/2021: BUN 27; Creatinine, Ser 1.13; Potassium 4.6; Sodium 137  Recent Lipid Panel    Component Value Date/Time   CHOL 163 02/06/2021 0912   TRIG 163 (H) 02/06/2021 0912   HDL 48 02/06/2021 0912   CHOLHDL 3.4 02/06/2021 0912   VLDL 33 02/06/2021 0912   LDLCALC 82 02/06/2021 0912         Physical Exam:    VS:  BP 132/74   Pulse 86   Ht 5\' 5"  (1.651 m)   Wt 145 lb (65.8 kg)   SpO2 99%   BMI 24.13 kg/m     Wt Readings from Last 3 Encounters:  04/03/21 145 lb (65.8 kg)  03/16/21 141 lb 3.2 oz (64 kg)  02/24/21 144 lb 6.4 oz (65.5 kg)     GEN:  Well nourished, well developed in no acute distress HEENT: Normal NECK: No JVD; No carotid bruits CARDIAC: RRR, 1-2/6 systolic murmur at RUSB. No rubs, gallops RESPIRATORY:  Clear to auscultation without rales, wheezing or rhonchi  ABDOMEN: Soft, non-tender, non-distended MUSCULOSKELETAL:  No edema; No deformity  SKIN: Warm and dry NEUROLOGIC:   Alert and oriented x 3 PSYCHIATRIC:  Normal affect   ASSESSMENT:    1. Palpitations   2. Murmur, cardiac   3. Pure hypercholesterolemia   4. Type 2 diabetes mellitus with hyperglycemia, without long-term current use of insulin (HCC)    PLAN:    In order of problems listed above:  #Palpitations: Patient having episodes of racing heart rates up to 100s at rest while relaxing and watching TV. No associated chest pain, lightheadedness, dizziness. She is otherwise active without anginal symptoms. Will check cardiac monitor. -Check 7day zio  #Systolic Murmur: -Check TTE  #  HLD: LDL 82 02/06/21. Tolerating lipitor -Continue lipitor 40mg  daily  #DMII: A1C 6.6. Managed by PCP. -Continue metformin 500mg  BID     Medication Adjustments/Labs and Tests Ordered: Current medicines are reviewed at length with the patient today.  Concerns regarding medicines are outlined above.  Orders Placed This Encounter  Procedures   LONG TERM MONITOR (3-14 DAYS)   EKG 12-Lead   ECHOCARDIOGRAM COMPLETE    No orders of the defined types were placed in this encounter.   Patient Instructions  Medication Instructions:  Your physician recommends that you continue on your current medications as directed. Please refer to the Current Medication list given to you today.  *If you need a refill on your cardiac medications before your next appointment, please call your pharmacy*  Lab Work: If you have labs (blood work) drawn today and your tests are completely normal, you will receive your results only by: MyChart Message (if you have MyChart) OR A paper copy in the mail If you have any lab test that is abnormal or we need to change your treatment, we will call you to review the results.   Testing/Procedures: Your physician has requested that you have an echocardiogram. Echocardiography is a painless test that uses sound waves to create images of your heart. It provides your doctor with information about  the size and shape of your heart and how well your heart's chambers and valves are working. This procedure takes approximately one hour. There are no restrictions for this procedure.  ZIO XT- Long Term Monitor Instructions  Your physician has requested you wear a ZIO patch monitor for 14 days.  This is a single patch monitor. [employer] supplies one patch monitor per enrollment. Additional stickers are not available. Please do not apply patch if you will be having a Nuclear Stress Test,  Echocardiogram, Cardiac CT, MRI, or Chest Xray during the period you would be wearing the  monitor. The patch cannot be worn during these tests. You cannot remove and re-apply the  ZIO XT patch monitor.  Your ZIO patch monitor will be mailed 3 day USPS to your address on file. It may take 3-5 days  to receive your monitor after you have been enrolled.  Once you have received your monitor, please review the enclosed instructions. Your monitor  has already been registered assigning a specific monitor serial # to you.  Billing and Patient Assistance Program Information  We have supplied [employer] with any of your insurance information on file for billing purposes. [employer] offers a sliding scale Patient Assistance Program for patients that do not have  insurance, or whose insurance does not completely cover the cost of the ZIO monitor.  You must apply for the Patient Assistance Program to qualify for this discounted rate.  To apply, please call [employer] at [phone], select option 4, select option 2, ask to apply for  Patient Assistance Program. will ask your household income, and how many people  are in your household. They will quote your out-of-pocket cost based on that information.  [employer] will also be able to set up a 16-month, interest-free payment plan if needed.  Applying the monitor   Shave hair from upper left chest.  Hold abrader disc by orange tab. Rub abrader in 40 strokes over the upper  left chest as  indicated in your monitor instructions.  Clean area with 4 enclosed alcohol pads. Let dry.  Apply patch as indicated in monitor instructions. Patch will be placed under collarbone on left  side of chest with arrow pointing upward.  Rub patch adhesive wings for 2 minutes. Remove white label marked "1". Remove the white  label marked "2". Rub patch adhesive wings for 2 additional minutes.  While looking in a mirror, press and release button in center of patch. A small green light will  flash 3-4 times. This will be your only indicator that the monitor has been turned on.  Do not shower for the first 24 hours. You may shower after the first 24 hours.  Press the button if you feel a symptom. You will hear a small click. Record Date, Time and  Symptom in the Patient Logbook.  When you are ready to remove the patch, follow instructions on the last 2 pages of Patient  Logbook. Stick patch monitor onto the last page of Patient Logbook.  Place Patient Logbook in the blue and white box. Use locking tab on box and tape box closed  securely. The blue and white box has prepaid postage on it. Please place it in the mailbox as  soon as possible. Your physician should have your test results approximately 7 days after the  monitor has been mailed back to [facility].  Call [facility] Customer Care at [phone] if you have questions regarding  your ZIO XT patch monitor. Call them immediately if you see an orange light blinking on your  monitor.  If your monitor falls off in less than 4 days, contact our Monitor department at [phone].  If your monitor becomes loose or falls off after 4 days call [employer] at [phone] for  suggestions on securing your monitor    Follow-Up: At [facility], you and your health needs are our priority.  As part of our continuing mission to provide you with exceptional heart care, we have created designated Provider Care Teams.  These Care  Teams include your primary Cardiologist (physician) and Advanced Practice Providers (APPs -  Physician Assistants and Nurse Practitioners) who all work together to provide you with the care you need, when you need it.  We recommend signing up for the patient portal called "MyChart".  Sign up information is provided on this After Visit Summary.  MyChart is used to connect with patients for Virtual Visits (Telemedicine).  Patients are able to view lab/test results, encounter notes, upcoming appointments, etc.  Non-urgent messages can be sent to your provider as well.   To learn more about what you can do with MyChart, go to [email or web address].    Your next appointment:   1 year(s)  The format for your next appointment:   In Person  Provider:   You may see Dr. Shari Prows or one of the following Advanced Practice Providers on your designated Care Team:   Tereso Newcomer, PA-C [place], [place]    Signed, Meriam Sprague, MD  04/03/2021 12:31 PM

## 2021-04-03 ENCOUNTER — Ambulatory Visit (INDEPENDENT_AMBULATORY_CARE_PROVIDER_SITE_OTHER): Payer: Medicare Other

## 2021-04-03 ENCOUNTER — Other Ambulatory Visit (HOSPITAL_BASED_OUTPATIENT_CLINIC_OR_DEPARTMENT_OTHER): Payer: Self-pay | Admitting: Nurse Practitioner

## 2021-04-03 ENCOUNTER — Ambulatory Visit (INDEPENDENT_AMBULATORY_CARE_PROVIDER_SITE_OTHER): Payer: Medicare Other | Admitting: Nurse Practitioner

## 2021-04-03 ENCOUNTER — Ambulatory Visit (HOSPITAL_BASED_OUTPATIENT_CLINIC_OR_DEPARTMENT_OTHER): Payer: Medicare Other | Admitting: Cardiology

## 2021-04-03 ENCOUNTER — Other Ambulatory Visit: Payer: Self-pay

## 2021-04-03 ENCOUNTER — Encounter (HOSPITAL_BASED_OUTPATIENT_CLINIC_OR_DEPARTMENT_OTHER): Payer: Self-pay | Admitting: Cardiology

## 2021-04-03 VITALS — BP 132/74 | HR 86 | Ht 65.0 in | Wt 145.0 lb

## 2021-04-03 DIAGNOSIS — R350 Frequency of micturition: Secondary | ICD-10-CM | POA: Diagnosis not present

## 2021-04-03 DIAGNOSIS — N3001 Acute cystitis with hematuria: Secondary | ICD-10-CM

## 2021-04-03 DIAGNOSIS — R011 Cardiac murmur, unspecified: Secondary | ICD-10-CM

## 2021-04-03 DIAGNOSIS — R82998 Other abnormal findings in urine: Secondary | ICD-10-CM | POA: Diagnosis not present

## 2021-04-03 DIAGNOSIS — N289 Disorder of kidney and ureter, unspecified: Secondary | ICD-10-CM

## 2021-04-03 DIAGNOSIS — R002 Palpitations: Secondary | ICD-10-CM

## 2021-04-03 DIAGNOSIS — E7800 Pure hypercholesterolemia, unspecified: Secondary | ICD-10-CM | POA: Diagnosis not present

## 2021-04-03 DIAGNOSIS — E1165 Type 2 diabetes mellitus with hyperglycemia: Secondary | ICD-10-CM | POA: Diagnosis not present

## 2021-04-03 DIAGNOSIS — N390 Urinary tract infection, site not specified: Secondary | ICD-10-CM

## 2021-04-03 LAB — POCT URINALYSIS DIPSTICK
Bilirubin, UA: NEGATIVE
Glucose, UA: NEGATIVE
Ketones, UA: NEGATIVE
Nitrite, UA: NEGATIVE
Protein, UA: POSITIVE — AB
Spec Grav, UA: >=1.03 — AB (ref 1.010–1.025)
Urobilinogen, UA: 0.2 E.U./dL
pH, UA: 5.5 (ref 5.0–8.0)

## 2021-04-03 MED ORDER — CIPROFLOXACIN HCL 500 MG PO TABS: 500.0000 mg | ORAL_TABLET | Freq: Two times a day (BID) | ORAL | 0 refills | Status: AC

## 2021-04-03 NOTE — Progress Notes (Unsigned)
Enrolled patient for a 57 Zio XT monitor to be mailed to patient homes  Sending 04/10/21 [place]

## 2021-04-03 NOTE — Progress Notes (Addendum)
   Subjective:    Patient ID: Bianca Phillips, female    DOB: [birth year], 65 y.o.   MRN: [id number]  HPI Increased frequency of urination and lower abdominal suprapubic pain x 1 week. Patient states this is her second or third UTI in the last 6-8 weeks   Review of Systems     Objective:   Physical Exam        Assessment & Plan:  Obtain UA and Urine culture. Patient is aware that Urology referral will be placed. Discussed patients symptoms and concerns with [name] DNP   Medical screening, questions, and recommendations were performed by medical assistant under direct supervision. I was immediately available for consultation and collaboration. I agree with the assessment and plan completed by the MA for this patient. Orders have been placed and f/u recommended.    Tollie Eth, DNP, AGNP-c

## 2021-04-03 NOTE — Patient Instructions (Signed)
Medication Instructions:  Your physician recommends that you continue on your current medications as directed. Please refer to the Current Medication list given to you today.  *If you need a refill on your cardiac medications before your next appointment, please call your pharmacy*  Lab Work: If you have labs (blood work) drawn today and your tests are completely normal, you will receive your results only by: MyChart Message (if you have MyChart) OR A paper copy in the mail If you have any lab test that is abnormal or we need to change your treatment, we will call you to review the results.   Testing/Procedures: Your physician has requested that you have an echocardiogram. Echocardiography is a painless test that uses sound waves to create images of your heart. It provides your doctor with information about the size and shape of your heart and how well your heart's chambers and valves are working. This procedure takes approximately one hour. There are no restrictions for this procedure.  ZIO XT- Long Term Monitor Instructions  Your physician has requested you wear a ZIO patch monitor for 14 days.  This is a single patch monitor. [employer] supplies one patch monitor per enrollment. Additional stickers are not available. Please do not apply patch if you will be having a Nuclear Stress Test,  Echocardiogram, Cardiac CT, MRI, or Chest Xray during the period you would be wearing the  monitor. The patch cannot be worn during these tests. You cannot remove and re-apply the  ZIO XT patch monitor.  Your ZIO patch monitor will be mailed 3 day USPS to your address on file. It may take 3-5 days  to receive your monitor after you have been enrolled.  Once you have received your monitor, please review the enclosed instructions. Your monitor  has already been registered assigning a specific monitor serial # to you.  Billing and Patient Assistance Program Information  We have supplied [employer] with any of  your insurance information on file for billing purposes. [employer] offers a sliding scale Patient Assistance Program for patients that do not have  insurance, or whose insurance does not completely cover the cost of the ZIO monitor.  You must apply for the Patient Assistance Program to qualify for this discounted rate.  To apply, please call [employer] at [phone], select option 4, select option 2, ask to apply for  Patient Assistance Program. Meredeth Ide will ask your household income, and how many people  are in your household. They will quote your out-of-pocket cost based on that information.  [employer] will also be able to set up a 39-month, interest-free payment plan if needed.  Applying the monitor   Shave hair from upper left chest.  Hold abrader disc by orange tab. Rub abrader in 40 strokes over the upper left chest as  indicated in your monitor instructions.  Clean area with 4 enclosed alcohol pads. Let dry.  Apply patch as indicated in monitor instructions. Patch will be placed under collarbone on left  side of chest with arrow pointing upward.  Rub patch adhesive wings for 2 minutes. Remove white label marked "1". Remove the white  label marked "2". Rub patch adhesive wings for 2 additional minutes.  While looking in a mirror, press and release button in center of patch. A small green light will  flash 3-4 times. This will be your only indicator that the monitor has been turned on.  Do not shower for the first 24 hours. You may shower after the first 24 hours.  Press the button if you feel a symptom. You will hear a small click. Record Date, Time and  Symptom in the Patient Logbook.  When you are ready to remove the patch, follow instructions on the last 2 pages of Patient  Logbook. Stick patch monitor onto the last page of Patient Logbook.  Place Patient Logbook in the blue and white box. Use locking tab on box and tape box closed  securely. The blue and white box has prepaid postage  on it. Please place it in the mailbox as  soon as possible. Your physician should have your test results approximately 7 days after the  monitor has been mailed back to [facility].  Call [facility] Customer Care at [phone] if you have questions regarding  your ZIO XT patch monitor. Call them immediately if you see an orange light blinking on your  monitor.  If your monitor falls off in less than 4 days, contact our Monitor department at [phone].  If your monitor becomes loose or falls off after 4 days call [employer] at [phone] for  suggestions on securing your monitor    Follow-Up: At [facility], you and your health needs are our priority.  As part of our continuing mission to provide you with exceptional heart care, we have created designated Provider Care Teams.  These Care Teams include your primary Cardiologist (physician) and Advanced Practice Providers (APPs -  Physician Assistants and Nurse Practitioners) who all work together to provide you with the care you need, when you need it.  We recommend signing up for the patient portal called "MyChart".  Sign up information is provided on this After Visit Summary.  MyChart is used to connect with patients for Virtual Visits (Telemedicine).  Patients are able to view lab/test results, encounter notes, upcoming appointments, etc.  Non-urgent messages can be sent to your provider as well.   To learn more about what you can do with MyChart, go to [email or web address].    Your next appointment:   1 year(s)  The format for your next appointment:   In Person  Provider:   You may see Dr. Shari Prows or one of the following Advanced Practice Providers on your designated Care Team:   Tereso Newcomer, PA-C [place], [place]

## 2021-04-07 ENCOUNTER — Telehealth (HOSPITAL_BASED_OUTPATIENT_CLINIC_OR_DEPARTMENT_OTHER): Payer: Self-pay

## 2021-04-07 DIAGNOSIS — B370 Candidal stomatitis: Secondary | ICD-10-CM

## 2021-04-07 MED ORDER — NYSTATIN 100000 UNIT/ML MT SUSP: 5.0000 mL | Freq: Four times a day (QID) | OROMUCOSAL | 0 refills | Status: DC

## 2021-04-07 NOTE — Telephone Encounter (Signed)
Nystatin swish and swallow sent to pharmacy.  If no improvement in 7 days, please let her know we will need to swab her mouth so we can be sure this is thrush.

## 2021-04-07 NOTE — Telephone Encounter (Signed)
Results released by [name], AGNP.  Instructed patient to contact the office with any questions or concerns.

## 2021-04-07 NOTE — Telephone Encounter (Signed)
Called patient to make her aware that a prescription has been sent to her pharmacy.  Informed her if there is no improvement in 7 days that she will need to schedule an appointment to have her mouth swabbed to make sure this is thrush.

## 2021-04-07 NOTE — Telephone Encounter (Signed)
Patient states she has what looks like thrush in her mouth from taking several courses of antibiotics.  She would like something she could take over the counter or prescription sent to [facility] on [address].

## 2021-04-07 NOTE — Telephone Encounter (Signed)
-----   Message from Tollie Eth, NP sent at 04/07/2021  7:53 AM EDT ----- Urine is positive again. Culture was sent- pending. Referral to urology has been placed for repeat UTI and reduction in Kidney function.  Patient was treated.

## 2021-04-07 NOTE — Progress Notes (Signed)
Urine is positive again. Culture was sent- pending. Referral to urology has been placed for repeat UTI and reduction in Kidney function.  Patient was treated.

## 2021-04-11 LAB — CULTURE, URINE COMPREHENSIVE

## 2021-04-13 NOTE — Progress Notes (Signed)
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 The antibiotic should cover this organism.   Has she heard from urology yet? Given that she has had several infections with different species, we need to find out what is going on with her system. Her kidney function is down and I am concerned with the amount of infections she is having. If she has not heard from Urology, please check on this referral for me.   Thank you

## 2021-04-14 DIAGNOSIS — R002 Palpitations: Secondary | ICD-10-CM

## 2021-04-15 ENCOUNTER — Telehealth (HOSPITAL_BASED_OUTPATIENT_CLINIC_OR_DEPARTMENT_OTHER): Payer: Self-pay

## 2021-04-15 NOTE — Telephone Encounter (Signed)
Left message for patient to call back for results and recommendations. 

## 2021-04-16 ENCOUNTER — Telehealth (HOSPITAL_BASED_OUTPATIENT_CLINIC_OR_DEPARTMENT_OTHER): Payer: Self-pay

## 2021-04-16 NOTE — Telephone Encounter (Signed)
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 The antibiotic should cover this organism.   Has she heard from urology yet? Given that she has had several infections with different species, we need to find out what is going on with her system. Her kidney function is down and I am concerned with the amount of infections she is having. If she has not heard from Urology, please check on this referral for me.   Thank you

## 2021-04-16 NOTE — Telephone Encounter (Signed)
Called patient to make her aware that her urine culture was positive for Klebsiella peumoniae. The antibiotic should cover this organism.  She is aware and understands.  She reports having an appointment scheduled with urology on 05/08/21 with Dr. Alvester Morin of [facility].  Instructed her to contact the office with any questions and concerns.

## 2021-04-17 ENCOUNTER — Other Ambulatory Visit (HOSPITAL_BASED_OUTPATIENT_CLINIC_OR_DEPARTMENT_OTHER): Payer: Self-pay | Admitting: Nurse Practitioner

## 2021-04-18 ENCOUNTER — Other Ambulatory Visit (HOSPITAL_BASED_OUTPATIENT_CLINIC_OR_DEPARTMENT_OTHER): Payer: Self-pay | Admitting: Nurse Practitioner

## 2021-04-20 ENCOUNTER — Other Ambulatory Visit (HOSPITAL_BASED_OUTPATIENT_CLINIC_OR_DEPARTMENT_OTHER): Payer: Self-pay | Admitting: Nurse Practitioner

## 2021-04-20 DIAGNOSIS — F320 Major depressive disorder, single episode, mild: Secondary | ICD-10-CM

## 2021-04-20 MED ORDER — SERTRALINE HCL 50 MG PO TABS: 50.0000 mg | ORAL_TABLET | Freq: Every day | ORAL | 3 refills | Status: DC

## 2021-04-22 DIAGNOSIS — H5211 Myopia, right eye: Secondary | ICD-10-CM | POA: Diagnosis not present

## 2021-04-23 ENCOUNTER — Other Ambulatory Visit (HOSPITAL_COMMUNITY): Payer: Medicare Other

## 2021-04-24 ENCOUNTER — Other Ambulatory Visit (HOSPITAL_BASED_OUTPATIENT_CLINIC_OR_DEPARTMENT_OTHER): Payer: Self-pay | Admitting: Nurse Practitioner

## 2021-04-24 DIAGNOSIS — R002 Palpitations: Secondary | ICD-10-CM | POA: Diagnosis not present

## 2021-04-27 ENCOUNTER — Ambulatory Visit (HOSPITAL_BASED_OUTPATIENT_CLINIC_OR_DEPARTMENT_OTHER): Payer: Medicare Other | Admitting: Physical Therapy

## 2021-04-27 ENCOUNTER — Other Ambulatory Visit (HOSPITAL_BASED_OUTPATIENT_CLINIC_OR_DEPARTMENT_OTHER): Payer: Self-pay | Admitting: Nurse Practitioner

## 2021-04-27 MED ORDER — LEVOTHYROXINE SODIUM 150 MCG PO TABS: 150.0000 ug | ORAL_TABLET | Freq: Every day | ORAL | 1 refills | Status: DC

## 2021-04-28 ENCOUNTER — Telehealth: Payer: Self-pay | Admitting: *Deleted

## 2021-04-28 MED ORDER — METOPROLOL TARTRATE 25 MG PO TABS: 25.0000 mg | ORAL_TABLET | Freq: Two times a day (BID) | ORAL | 2 refills | Status: DC | PRN

## 2021-04-28 NOTE — Telephone Encounter (Signed)
The patient has been notified of the result and verbalized understanding.  All questions (if any) were answered.  Pt asked if she could take the metoprolol as needed vs scheduled, for she very rarely has palpitations.  Informed the pt that Dr. Shari Prows said she could absolutely take metoprolol tartrate 25 mg po bid as needed for palpitations. Confirmed the pharmacy of choice with the pt. Pt verbalized understanding and agrees with this plan.

## 2021-04-28 NOTE — Telephone Encounter (Signed)
-----   Message from Meriam Sprague, MD sent at 04/27/2021  5:05 PM EDT ----- Her monitor shows that she is having several runs of extra beats from the top chamber of the heart called SVT. These are not harmful but can lead to palpitations or the sensation that her heart is racing. If they are bothersome to her, we can start her on metoprolol 25mg  BID to help suppress the extra beats.

## 2021-04-30 ENCOUNTER — Ambulatory Visit (HOSPITAL_COMMUNITY): Payer: Medicare Other | Attending: Cardiology

## 2021-04-30 ENCOUNTER — Other Ambulatory Visit: Payer: Self-pay

## 2021-04-30 DIAGNOSIS — R011 Cardiac murmur, unspecified: Secondary | ICD-10-CM | POA: Diagnosis not present

## 2021-04-30 LAB — ECHOCARDIOGRAM COMPLETE
Area-P 1/2: 3.97 cm2
P 1/2 time: 212 msec
S' Lateral: 2.3 cm

## 2021-05-08 DIAGNOSIS — R350 Frequency of micturition: Secondary | ICD-10-CM | POA: Diagnosis not present

## 2021-05-08 DIAGNOSIS — N390 Urinary tract infection, site not specified: Secondary | ICD-10-CM | POA: Diagnosis not present

## 2021-06-10 ENCOUNTER — Other Ambulatory Visit (HOSPITAL_BASED_OUTPATIENT_CLINIC_OR_DEPARTMENT_OTHER): Payer: Self-pay | Admitting: Nurse Practitioner

## 2021-06-24 ENCOUNTER — Ambulatory Visit (INDEPENDENT_AMBULATORY_CARE_PROVIDER_SITE_OTHER): Payer: Medicare Other | Admitting: Family Medicine

## 2021-06-24 ENCOUNTER — Other Ambulatory Visit: Payer: Self-pay

## 2021-06-24 ENCOUNTER — Encounter (HOSPITAL_BASED_OUTPATIENT_CLINIC_OR_DEPARTMENT_OTHER): Payer: Self-pay | Admitting: Family Medicine

## 2021-06-24 VITALS — BP 146/78 | HR 98 | Ht 65.0 in | Wt 142.0 lb

## 2021-06-24 DIAGNOSIS — G4700 Insomnia, unspecified: Secondary | ICD-10-CM | POA: Diagnosis not present

## 2021-06-24 DIAGNOSIS — R350 Frequency of micturition: Secondary | ICD-10-CM | POA: Diagnosis not present

## 2021-06-24 DIAGNOSIS — R300 Dysuria: Secondary | ICD-10-CM

## 2021-06-24 LAB — POCT URINALYSIS DIPSTICK
Bilirubin, UA: NEGATIVE
Glucose, UA: NEGATIVE
Ketones, UA: NEGATIVE
Nitrite, UA: NEGATIVE
Protein, UA: NEGATIVE
Spec Grav, UA: 1.02 (ref 1.010–1.025)
Urobilinogen, UA: 0.2 E.U./dL
pH, UA: 5.5 (ref 5.0–8.0)

## 2021-06-24 NOTE — Patient Instructions (Addendum)
  Medication Instructions:  Your physician recommends that you continue on your current medications as directed. Please refer to the Current Medication list given to you today. --If you need a refill on any your medications before your next appointment, please call your pharmacy first. If no refills are authorized on file call the office.- Your next appointment:   Your physician recommends that you schedule a follow-up appointment in: 4-6 WEEKS with Minna Merritts Early  You will receive a text message or e-mail with a link to a survey about your care and experience with [place] today! We would greatly appreciate your feedback!   Thanks for letting us be apart of your health journey!!  Primary Care and Sports Medicine   Dr. Ceasar Mons [place]   We encourage you to activate your patient portal called "MyChart".  Sign up information is provided on this After Visit Summary.  MyChart is used to connect with patients for Virtual Visits (Telemedicine).  Patients are able to view lab/test results, encounter notes, upcoming appointments, etc.  Non-urgent messages can be sent to your provider as well. To learn more about what you can do with MyChart, please visit --  [email or web address].

## 2021-06-24 NOTE — Progress Notes (Signed)
    Procedures performed today:    None.  Independent interpretation of notes and tests performed by another provider:   None.  Brief History, Exam, Impression, and Recommendations:    BP (!) 146/78   Pulse 98   Ht 5\' 5"  (1.651 m)   Wt 142 lb (64.4 kg)   SpO2 100%   BMI 23.63 kg/m   Insomnia Reports that she has been having issues with sleep initiation primarily Will lay in bed for up to 2 to 3 hours before being able to fall asleep She has hesitant regarding pharmacotherapy related to this, however endorses notable distress with inability to fall asleep Does report that because of lack of sleep at night, she will feel tired during the day and nap during the day Discussed importance of adhering to proper sleep hygiene, provided handout reviewing recommended sleep hygiene measures Discussed consideration of over-the-counter melatonin to assist with sleep initiation, recommend taking this about 30 minutes prior to bed Discussed possible role of CBT if continuing to have issues Defer further follow-up and treatment recommendations to PCP Recommend follow-up in 4 to 6 weeks with PCP to review progress with above  Urinary frequency Bianca Phillips is a patient of [name] who is being seen today for concerns related to urinary frequency Patient has had recent urinary tract infections based on chart review.  Most recently she had urine culture which was positive for Klebsiella pneumonia.  This was about 3 months ago.  Patient has also been referred to urology, and has established with them.  She was started on daily nitrofurantoin.  Is scheduled for additional studies including an ultrasound of her kidneys in 1 week Symptoms primarily are urinary frequency as well as some lower abdominal pressure.  She does report some back pain but this is over her lower lumbar spine/sacrum On exam, patient with mild lower abdominal tenderness, no rebound tenderness or guarding.  No tenderness palpation  through lumbar spine or through SI joints.  No CVA tenderness. Urinalysis in the office today with small leukocyte esterase, negative nitrites.  Overall, unremarkable UA Given symptoms, will send urine for culture Recommend continuing with antibiotics as prescribed by urology, continue with follow-up as indicated by urology with imaging study next week and office visit as recommended  discussed symptoms that would be more concerning for which patient should return for evaluation or present to urgent care or emergency department including systemic symptoms such as fevers, chills, night sweats.  Patient voiced understanding.  Patient to follow-up with PCP in about 4 to 6 weeks or sooner as needed   ___________________________________________ Jennah Satchell de Kandice Hams, MD, ABFM, CAQSM Primary Care and Sports Medicine [facility]

## 2021-06-24 NOTE — Assessment & Plan Note (Signed)
Bianca Phillips is a patient of Kandice Hams who is being seen today for concerns related to urinary frequency Patient has had recent urinary tract infections based on chart review.  Most recently she had urine culture which was positive for Klebsiella pneumonia.  This was about 3 months ago.  Patient has also been referred to urology, and has established with them.  She was started on daily nitrofurantoin.  Is scheduled for additional studies including an ultrasound of her kidneys in 1 week Symptoms primarily are urinary frequency as well as some lower abdominal pressure.  She does report some back pain but this is over her lower lumbar spine/sacrum On exam, patient with mild lower abdominal tenderness, no rebound tenderness or guarding.  No tenderness palpation through lumbar spine or through SI joints.  No CVA tenderness. Urinalysis in the office today with small leukocyte esterase, negative nitrites.  Overall, unremarkable UA Given symptoms, will send urine for culture Recommend continuing with antibiotics as prescribed by urology, continue with follow-up as indicated by urology with imaging study next week and office visit as recommended  discussed symptoms that would be more concerning for which patient should return for evaluation or present to urgent care or emergency department including systemic symptoms such as fevers, chills, night sweats.  Patient voiced understanding.

## 2021-06-24 NOTE — Assessment & Plan Note (Signed)
Reports that she has been having issues with sleep initiation primarily Will lay in bed for up to 2 to 3 hours before being able to fall asleep She has hesitant regarding pharmacotherapy related to this, however endorses notable distress with inability to fall asleep Does report that because of lack of sleep at night, she will feel tired during the day and nap during the day Discussed importance of adhering to proper sleep hygiene, provided handout reviewing recommended sleep hygiene measures Discussed consideration of over-the-counter melatonin to assist with sleep initiation, recommend taking this about 30 minutes prior to bed Discussed possible role of CBT if continuing to have issues Defer further follow-up and treatment recommendations to PCP Recommend follow-up in 4 to 6 weeks with PCP to review progress with above

## 2021-06-27 LAB — URINE CULTURE

## 2021-07-01 ENCOUNTER — Other Ambulatory Visit (HOSPITAL_BASED_OUTPATIENT_CLINIC_OR_DEPARTMENT_OTHER): Payer: Self-pay | Admitting: Nurse Practitioner

## 2021-07-01 DIAGNOSIS — N390 Urinary tract infection, site not specified: Secondary | ICD-10-CM | POA: Diagnosis not present

## 2021-07-01 DIAGNOSIS — N1330 Unspecified hydronephrosis: Secondary | ICD-10-CM | POA: Diagnosis not present

## 2021-07-01 DIAGNOSIS — N134 Hydroureter: Secondary | ICD-10-CM | POA: Diagnosis not present

## 2021-07-01 DIAGNOSIS — N130 Hydronephrosis with ureteropelvic junction obstruction: Secondary | ICD-10-CM | POA: Diagnosis not present

## 2021-07-02 ENCOUNTER — Telehealth (HOSPITAL_BASED_OUTPATIENT_CLINIC_OR_DEPARTMENT_OTHER): Payer: Self-pay

## 2021-07-02 NOTE — Telephone Encounter (Signed)
Patient is aware and agreeable to lab results and recommendations 

## 2021-07-02 NOTE — Telephone Encounter (Signed)
-----   Message from Hosie Poisson [place], MD sent at 07/01/2021  4:45 PM EDT ----- Urine culture resulted in mixed urogenital flora.  This result is unremarkable and does not suggest current infection.

## 2021-07-06 ENCOUNTER — Other Ambulatory Visit (HOSPITAL_BASED_OUTPATIENT_CLINIC_OR_DEPARTMENT_OTHER): Payer: Self-pay | Admitting: Nurse Practitioner

## 2021-07-06 DIAGNOSIS — E1165 Type 2 diabetes mellitus with hyperglycemia: Secondary | ICD-10-CM

## 2021-07-08 ENCOUNTER — Telehealth (HOSPITAL_BASED_OUTPATIENT_CLINIC_OR_DEPARTMENT_OTHER): Payer: Self-pay

## 2021-07-08 ENCOUNTER — Other Ambulatory Visit (HOSPITAL_BASED_OUTPATIENT_CLINIC_OR_DEPARTMENT_OTHER): Payer: Self-pay

## 2021-07-08 DIAGNOSIS — F411 Generalized anxiety disorder: Secondary | ICD-10-CM

## 2021-07-08 MED ORDER — ALPRAZOLAM 0.25 MG PO TABS: 0.2500 mg | ORAL_TABLET | Freq: Every evening | ORAL | 0 refills | Status: DC | PRN

## 2021-07-08 NOTE — Telephone Encounter (Signed)
Both sent 

## 2021-07-08 NOTE — Telephone Encounter (Signed)
Patient called wanting her prescription for alprazolam 0.25 mg and Accu-Chek Aviva Plus test strips sent in to [facility] on [address] and [address].

## 2021-07-09 DIAGNOSIS — N201 Calculus of ureter: Secondary | ICD-10-CM | POA: Diagnosis not present

## 2021-07-09 DIAGNOSIS — N130 Hydronephrosis with ureteropelvic junction obstruction: Secondary | ICD-10-CM | POA: Diagnosis not present

## 2021-07-13 ENCOUNTER — Other Ambulatory Visit: Payer: Self-pay | Admitting: Urology

## 2021-07-14 ENCOUNTER — Encounter (HOSPITAL_BASED_OUTPATIENT_CLINIC_OR_DEPARTMENT_OTHER): Payer: Self-pay | Admitting: Urology

## 2021-07-14 ENCOUNTER — Other Ambulatory Visit: Payer: Self-pay

## 2021-07-14 ENCOUNTER — Telehealth (HOSPITAL_BASED_OUTPATIENT_CLINIC_OR_DEPARTMENT_OTHER): Payer: Self-pay

## 2021-07-14 DIAGNOSIS — E1165 Type 2 diabetes mellitus with hyperglycemia: Secondary | ICD-10-CM

## 2021-07-14 MED ORDER — METFORMIN HCL 500 MG PO TABS: ORAL_TABLET | ORAL | 3 refills | Status: DC

## 2021-07-14 NOTE — Progress Notes (Signed)
Spoke w/ via phone for pre-op interview--- pt Lab needs dos----  Mirant results------ current ekg in epic/ chart COVID test -----patient states asymptomatic no test needed Arrive at ------- 1230 on 07-20-2021 NPO after MN NO Solid Food.  Clear liquids from MN until--- 1130 Med rec completed Medications to take morning of surgery ----- zoloft, synthroid, stool softner, and take metoprolol if needed for palpitations Diabetic medication ----- do not take metformin morning of surgery Patient instructed no nail polish to be worn day of surgery Patient instructed to bring photo id and insurance card day of surgery Patient aware to have Driver (ride ) / caregiver  for 24 hours after surgery -- daughter, Bianca Phillips Patient Special Instructions ----- n/a Pre-Op special Istructions ----- n/a Patient verbalized understanding of instructions that were given at this phone interview. Patient denies shortness of breath, chest pain, fever, cough at this phone interview.

## 2021-07-14 NOTE — Telephone Encounter (Signed)
error 

## 2021-07-14 NOTE — Telephone Encounter (Signed)
Patient states she just noticed her prescription for metformin is written to take one in the morning  and one at night; however, since her old prescription was written to take one and a half in the morning and one at night she has been taking it that way.  She would like to continue taking one and a half in the morning and one at night and would like for her prescription and quantity to reflect that.

## 2021-07-20 ENCOUNTER — Ambulatory Visit (HOSPITAL_BASED_OUTPATIENT_CLINIC_OR_DEPARTMENT_OTHER): Payer: Medicare Other | Admitting: Anesthesiology

## 2021-07-20 ENCOUNTER — Encounter (HOSPITAL_BASED_OUTPATIENT_CLINIC_OR_DEPARTMENT_OTHER): Payer: Self-pay | Admitting: Urology

## 2021-07-20 ENCOUNTER — Encounter (HOSPITAL_BASED_OUTPATIENT_CLINIC_OR_DEPARTMENT_OTHER)
Admission: RE | Disposition: A | Discharge status: Home / Self Care | Payer: Self-pay | Source: Home / Self Care | Attending: Urology

## 2021-07-20 ENCOUNTER — Other Ambulatory Visit: Payer: Self-pay

## 2021-07-20 ENCOUNTER — Ambulatory Visit (HOSPITAL_BASED_OUTPATIENT_CLINIC_OR_DEPARTMENT_OTHER)
Admission: RE | Admit: 2021-07-20 | Discharge: 2021-07-20 | Disposition: A | Discharge status: Home / Self Care | Payer: Medicare Other | Attending: Urology | Admitting: Urology

## 2021-07-20 DIAGNOSIS — N1832 Chronic kidney disease, stage 3b: Secondary | ICD-10-CM | POA: Diagnosis not present

## 2021-07-20 DIAGNOSIS — E119 Type 2 diabetes mellitus without complications: Secondary | ICD-10-CM | POA: Insufficient documentation

## 2021-07-20 DIAGNOSIS — Z7984 Long term (current) use of oral hypoglycemic drugs: Secondary | ICD-10-CM | POA: Diagnosis not present

## 2021-07-20 DIAGNOSIS — Z87442 Personal history of urinary calculi: Secondary | ICD-10-CM | POA: Diagnosis not present

## 2021-07-20 DIAGNOSIS — Z8249 Family history of ischemic heart disease and other diseases of the circulatory system: Secondary | ICD-10-CM | POA: Diagnosis not present

## 2021-07-20 DIAGNOSIS — Z87440 Personal history of urinary (tract) infections: Secondary | ICD-10-CM | POA: Diagnosis not present

## 2021-07-20 DIAGNOSIS — Z87891 Personal history of nicotine dependence: Secondary | ICD-10-CM | POA: Diagnosis not present

## 2021-07-20 DIAGNOSIS — D509 Iron deficiency anemia, unspecified: Secondary | ICD-10-CM | POA: Diagnosis not present

## 2021-07-20 DIAGNOSIS — N132 Hydronephrosis with renal and ureteral calculous obstruction: Secondary | ICD-10-CM | POA: Insufficient documentation

## 2021-07-20 DIAGNOSIS — N201 Calculus of ureter: Secondary | ICD-10-CM

## 2021-07-20 DIAGNOSIS — Z8616 Personal history of COVID-19: Secondary | ICD-10-CM | POA: Insufficient documentation

## 2021-07-20 DIAGNOSIS — Z833 Family history of diabetes mellitus: Secondary | ICD-10-CM | POA: Diagnosis not present

## 2021-07-20 DIAGNOSIS — Z881 Allergy status to other antibiotic agents status: Secondary | ICD-10-CM | POA: Diagnosis not present

## 2021-07-20 DIAGNOSIS — E1122 Type 2 diabetes mellitus with diabetic chronic kidney disease: Secondary | ICD-10-CM | POA: Diagnosis not present

## 2021-07-20 DIAGNOSIS — Z79899 Other long term (current) drug therapy: Secondary | ICD-10-CM | POA: Diagnosis not present

## 2021-07-20 HISTORY — DX: Personal history of urinary (tract) infections: Z87.440

## 2021-07-20 HISTORY — DX: Urgency of urination: R39.15

## 2021-07-20 HISTORY — DX: Postprocedural hypothyroidism: E89.0

## 2021-07-20 HISTORY — DX: Personal history of urinary calculi: Z87.442

## 2021-07-20 HISTORY — DX: Other complications of anesthesia, initial encounter: T88.59XA

## 2021-07-20 HISTORY — DX: Generalized anxiety disorder: F41.1

## 2021-07-20 HISTORY — DX: Palpitations: R00.2

## 2021-07-20 HISTORY — PX: CYSTOSCOPY/URETEROSCOPY/HOLMIUM LASER/STENT PLACEMENT: SHX6546

## 2021-07-20 HISTORY — DX: Calculus of ureter: N20.1

## 2021-07-20 HISTORY — DX: Other seasonal allergic rhinitis: J30.2

## 2021-07-20 HISTORY — DX: Type 2 diabetes mellitus without complications: E11.9

## 2021-07-20 HISTORY — DX: Cardiac murmur, unspecified: R01.1

## 2021-07-20 HISTORY — DX: Unspecified osteoarthritis, unspecified site: M19.90

## 2021-07-20 HISTORY — DX: Constipation, unspecified: K59.00

## 2021-07-20 HISTORY — DX: Major depressive disorder, single episode, unspecified: F32.9

## 2021-07-20 HISTORY — DX: Iron deficiency anemia, unspecified: D50.9

## 2021-07-20 HISTORY — DX: Personal history of other endocrine, nutritional and metabolic disease: Z86.39

## 2021-07-20 HISTORY — DX: Presence of external hearing-aid: Z97.4

## 2021-07-20 HISTORY — DX: Personal history of COVID-19: Z86.16

## 2021-07-20 LAB — POCT I-STAT, CHEM 8
BUN: 23 mg/dL (ref 8–23)
Calcium, Ion: 1.33 mmol/L (ref 1.15–1.40)
Chloride: 102 mmol/L (ref 98–111)
Creatinine, Ser: 1.2 mg/dL — ABNORMAL HIGH (ref 0.44–1.00)
Glucose, Bld: 136 mg/dL — ABNORMAL HIGH (ref 70–99)
HCT: 43 % (ref 36.0–46.0)
Hemoglobin: 14.6 g/dL (ref 12.0–15.0)
Potassium: 3.9 mmol/L (ref 3.5–5.1)
Sodium: 141 mmol/L (ref 135–145)
TCO2: 26 mmol/L (ref 22–32)

## 2021-07-20 LAB — GLUCOSE, CAPILLARY: Glucose-Capillary: 149 mg/dL — ABNORMAL HIGH (ref 70–99)

## 2021-07-20 SURGERY — CYSTOSCOPY/URETEROSCOPY/HOLMIUM LASER/STENT PLACEMENT
Anesthesia: General | Site: Pelvis | Laterality: Right

## 2021-07-20 MED ORDER — ACETAMINOPHEN 10 MG/ML IV SOLN: 1000.0000 mg | Freq: Once | INTRAVENOUS | Status: DC | PRN

## 2021-07-20 MED ORDER — LIDOCAINE 2% (20 MG/ML) 5 ML SYRINGE
INTRAMUSCULAR | Status: AC
  Filled 2021-07-20: qty 5

## 2021-07-20 MED ORDER — KETOROLAC TROMETHAMINE 30 MG/ML IJ SOLN
INTRAMUSCULAR | Status: AC
  Filled 2021-07-20: qty 1

## 2021-07-20 MED ORDER — FENTANYL CITRATE (PF) 100 MCG/2ML IJ SOLN
INTRAMUSCULAR | Status: AC
  Filled 2021-07-20: qty 2

## 2021-07-20 MED ORDER — LACTATED RINGERS IV SOLN: INTRAVENOUS | Status: DC

## 2021-07-20 MED ORDER — PROPOFOL 10 MG/ML IV BOLUS
INTRAVENOUS | Status: AC
  Filled 2021-07-20: qty 20

## 2021-07-20 MED ORDER — SODIUM CHLORIDE 0.9 % IR SOLN
Status: DC | PRN
  Administered 2021-07-20: 5000 mL

## 2021-07-20 MED ORDER — MIDAZOLAM HCL 2 MG/2ML IJ SOLN
INTRAMUSCULAR | Status: AC
  Filled 2021-07-20: qty 2

## 2021-07-20 MED ORDER — PROPOFOL 10 MG/ML IV BOLUS
INTRAVENOUS | Status: DC | PRN
  Administered 2021-07-20: 200 mg via INTRAVENOUS

## 2021-07-20 MED ORDER — FENTANYL CITRATE (PF) 100 MCG/2ML IJ SOLN
INTRAMUSCULAR | Status: DC | PRN
  Administered 2021-07-20 (×2): 25 ug via INTRAVENOUS
  Administered 2021-07-20: 50 ug via INTRAVENOUS

## 2021-07-20 MED ORDER — OXYCODONE HCL 5 MG/5ML PO SOLN: 5.0000 mg | Freq: Once | ORAL | Status: DC | PRN

## 2021-07-20 MED ORDER — CEFAZOLIN SODIUM-DEXTROSE 2-4 GM/100ML-% IV SOLN
INTRAVENOUS | Status: AC
  Filled 2021-07-20: qty 100

## 2021-07-20 MED ORDER — EPHEDRINE SULFATE-NACL 50-0.9 MG/10ML-% IV SOSY
PREFILLED_SYRINGE | INTRAVENOUS | Status: DC | PRN
  Administered 2021-07-20 (×2): 10 mg via INTRAVENOUS

## 2021-07-20 MED ORDER — CEPHALEXIN 500 MG PO CAPS: 500.0000 mg | ORAL_CAPSULE | Freq: Two times a day (BID) | ORAL | 0 refills | Status: AC

## 2021-07-20 MED ORDER — ONDANSETRON HCL 4 MG/2ML IJ SOLN
INTRAMUSCULAR | Status: AC
  Filled 2021-07-20: qty 2

## 2021-07-20 MED ORDER — DEXAMETHASONE SODIUM PHOSPHATE 10 MG/ML IJ SOLN
INTRAMUSCULAR | Status: AC
  Filled 2021-07-20: qty 1

## 2021-07-20 MED ORDER — ONDANSETRON HCL 4 MG/2ML IJ SOLN: 4.0000 mg | Freq: Once | INTRAMUSCULAR | Status: DC | PRN

## 2021-07-20 MED ORDER — TAMSULOSIN HCL 0.4 MG PO CAPS: 0.4000 mg | ORAL_CAPSULE | Freq: Every day | ORAL | 0 refills | Status: DC

## 2021-07-20 MED ORDER — LIDOCAINE 2% (20 MG/ML) 5 ML SYRINGE
INTRAMUSCULAR | Status: DC | PRN
  Administered 2021-07-20: 100 mg via INTRAVENOUS

## 2021-07-20 MED ORDER — CEFAZOLIN SODIUM-DEXTROSE 2-4 GM/100ML-% IV SOLN
2.0000 g | Freq: Once | INTRAVENOUS | Status: AC
  Administered 2021-07-20: 2 g via INTRAVENOUS

## 2021-07-20 MED ORDER — OXYCODONE HCL 5 MG PO TABS
5.0000 mg | ORAL_TABLET | Freq: Once | ORAL | Status: DC | PRN
Start: 2021-07-20 — End: 2021-07-20

## 2021-07-20 MED ORDER — MIDAZOLAM HCL 2 MG/2ML IJ SOLN
INTRAMUSCULAR | Status: DC | PRN
  Administered 2021-07-20: 2 mg via INTRAVENOUS

## 2021-07-20 MED ORDER — KETOROLAC TROMETHAMINE 30 MG/ML IJ SOLN
INTRAMUSCULAR | Status: DC | PRN
  Administered 2021-07-20: 30 mg via INTRAVENOUS

## 2021-07-20 MED ORDER — DEXAMETHASONE SODIUM PHOSPHATE 10 MG/ML IJ SOLN
INTRAMUSCULAR | Status: DC | PRN
  Administered 2021-07-20: 5 mg via INTRAVENOUS

## 2021-07-20 MED ORDER — FENTANYL CITRATE (PF) 100 MCG/2ML IJ SOLN: 25.0000 ug | INTRAMUSCULAR | Status: DC | PRN

## 2021-07-20 MED ORDER — HYDROCODONE-ACETAMINOPHEN 5-325 MG PO TABS: 1.0000 | ORAL_TABLET | Freq: Four times a day (QID) | ORAL | 0 refills | Status: DC | PRN

## 2021-07-20 MED ORDER — IOHEXOL 300 MG/ML  SOLN
INTRAMUSCULAR | Status: DC | PRN
  Administered 2021-07-20: 6 mL via URETHRAL

## 2021-07-20 MED ORDER — ONDANSETRON HCL 4 MG/2ML IJ SOLN
INTRAMUSCULAR | Status: DC | PRN
  Administered 2021-07-20: 4 mg via INTRAVENOUS

## 2021-07-20 SURGICAL SUPPLY — 30 items
APL SKNCLS STERI-STRIP NONHPOA (GAUZE/BANDAGES/DRESSINGS) ×1
BAG DRAIN URO-CYSTO SKYTR STRL (DRAIN) ×2 IMPLANT
BAG DRN UROCATH (DRAIN) ×1
BASKET ZERO TIP NITINOL 2.4FR (BASKET) ×2 IMPLANT
BENZOIN TINCTURE PRP APPL 2/3 (GAUZE/BANDAGES/DRESSINGS) ×2 IMPLANT
BSKT STON RTRVL ZERO TP 2.4FR (BASKET) ×1
CATH SET URETHRAL DILATOR (CATHETERS) IMPLANT
CATH URET 5FR 28IN OPEN ENDED (CATHETERS) ×2 IMPLANT
CLOTH BEACON ORANGE TIMEOUT ST (SAFETY) ×2 IMPLANT
COVER DOME SNAP 22 D (MISCELLANEOUS) ×2 IMPLANT
DRSG TEGADERM 2-3/8X2-3/4 SM (GAUZE/BANDAGES/DRESSINGS) ×2 IMPLANT
FIBER LASER FLEXIVA 365 (UROLOGICAL SUPPLIES) IMPLANT
GLOVE SURG ENC MOIS LTX SZ7 (GLOVE) ×2 IMPLANT
GOWN STRL REUS W/TWL LRG LVL3 (GOWN DISPOSABLE) ×2 IMPLANT
GUIDEWIRE STR DUAL SENSOR (WIRE) ×2 IMPLANT
GUIDEWIRE ZIPWRE .038 STRAIGHT (WIRE) ×2 IMPLANT
IV NS 1000ML (IV SOLUTION)
IV NS 1000ML BAXH (IV SOLUTION) IMPLANT
IV NS IRRIG 3000ML ARTHROMATIC (IV SOLUTION) ×4 IMPLANT
KIT TURNOVER CYSTO (KITS) ×2 IMPLANT
MANIFOLD NEPTUNE II (INSTRUMENTS) ×2 IMPLANT
NS IRRIG 500ML POUR BTL (IV SOLUTION) ×2 IMPLANT
PACK CYSTO (CUSTOM PROCEDURE TRAY) ×2 IMPLANT
STENT URET 6FRX26 CONTOUR (STENTS) ×2 IMPLANT
SYR 10ML LL (SYRINGE) ×2 IMPLANT
TRACTIP FLEXIVA PULS ID 200XHI (Laser) ×1 IMPLANT
TRACTIP FLEXIVA PULSE ID 200 (Laser) ×2
TUBE CONNECTING 12X1/4 (SUCTIONS) ×2 IMPLANT
TUBE FEEDING 8FR 16IN STR KANG (MISCELLANEOUS) ×2 IMPLANT
TUBING UROLOGY SET (TUBING) ×2 IMPLANT

## 2021-07-20 NOTE — Anesthesia Procedure Notes (Signed)
Procedure Name: LMA Insertion Date/Time: 07/20/2021 1:43 PM Performed by: Francie Massing, CRNA Pre-anesthesia Checklist: Patient identified, Emergency Drugs available, Suction available and Patient being monitored Patient Re-evaluated:Patient Re-evaluated prior to induction Oxygen Delivery Method: Circle system utilized Preoxygenation: Pre-oxygenation with 100% oxygen Induction Type: IV induction Ventilation: Mask ventilation without difficulty LMA: LMA inserted LMA Size: 4.0 Number of attempts: 1 Airway Equipment and Method: Bite block Placement Confirmation: positive ETCO2 Tube secured with: Tape Dental Injury: Teeth and Oropharynx as per pre-operative assessment

## 2021-07-20 NOTE — Transfer of Care (Signed)
Immediate Anesthesia Transfer of Care Note  Patient: Emylie Amster  Procedure(s) Performed: Procedure(s) (LRB): CYSTOSCOPY/RETROGRADE/URETEROSCOPY/HOLMIUM LASER/STENT PLACEMENT (Right)  Patient Location: PACU  Anesthesia Type: General  Level of Consciousness: awake, oriented, sedated and patient cooperative  Airway & Oxygen Therapy: Patient Spontanous Breathing and Patient connected to face mask oxygen  Post-op Assessment: Report given to PACU RN and Post -op Vital signs reviewed and stable  Post vital signs: Reviewed and stable  Complications: No apparent anesthesia complications Last Vitals:  Vitals Value Taken Time  BP 160/72 07/20/21 1503  Temp    Pulse 104 07/20/21 1504  Resp 14 07/20/21 1504  SpO2 100 % 07/20/21 1504  Vitals shown include unvalidated device data.  Last Pain:  Vitals:   07/20/21 1317  TempSrc: Oral  PainSc: 0-No pain      Patients Stated Pain Goal: 5 (07/20/21 1317)  Complications: No notable events documented.

## 2021-07-20 NOTE — Discharge Instructions (Addendum)
[facility] [phone] Post Ureteroscopy With or Without Stent Instructions  Definitions:  Ureter: The duct that transports urine from the kidney to the bladder. Stent:   A plastic hollow tube that is placed into the ureter, from the kidney to the bladder to prevent the ureter from swelling shut.  GENERAL INSTRUCTIONS:  Despite the fact that no skin incisions were used, the area around the ureter and bladder is raw and irritated. The stent is a foreign body which will further irritate the bladder wall. This irritation is manifested by increased frequency of urination, both day and night, and by an increase in the urge to urinate. In some, the urge to urinate is present almost always. Sometimes the urge is strong enough that you may not be able to stop yourself from urinating. The only real cure is to remove the stent and then give time for the bladder wall to heal which can't be done until the danger of the ureter swelling shut has passed, which varies.  You may see some blood in your urine while the stent is in place and a few days afterwards. Do not be alarmed, even if the urine was clear for a while. Get off your feet and drink lots of fluids until clearing occurs. If you start to pass clots or don't improve, call us.  DIET: You may return to your normal diet immediately. Because of the raw surface of your bladder, alcohol, spicy foods, acid type foods and drinks with caffeine may cause irritation or frequency and should be used in moderation. To keep your urine flowing freely and to avoid constipation, drink plenty of fluids during the day ( 8-10 glasses ). Tip: Avoid cranberry juice because it is very acidic.  ACTIVITY: Your physical activity doesn't need to be restricted. However, if you are very active, you may see some blood in your urine. We suggest that you reduce your activity under these circumstances until the bleeding has stopped.  BOWELS: It is important to  keep your bowels regular during the postoperative period. Straining with bowel movements can cause bleeding. A bowel movement every other day is reasonable. Use a mild laxative if needed, such as Milk of Magnesia 2-3 tablespoons, or 2 Dulcolax tablets. Call if you continue to have problems. If you have been taking narcotics for pain, before, during or after your surgery, you may be constipated. Take a laxative if necessary.   MEDICATION: You should resume your pre-surgery medications unless told not to. In addition you will often be given an antibiotic to prevent infection. These should be taken as prescribed until the bottles are finished unless you are having an unusual reaction to one of the drugs.  PROBLEMS YOU SHOULD REPORT TO [place]: Fevers over 100.5 Fahrenheit. Heavy bleeding, or clots ( See above notes about blood in urine ). Inability to urinate. Drug reactions ( hives, rash, nausea, vomiting, diarrhea ). Severe burning or pain with urination that is not improving.  FOLLOW-UP: You will need a follow-up appointment to monitor your progress. Call for this appointment at the number listed above. Usually the first appointment will be about three to fourteen days after your surgery.   Post Anesthesia Home Care Instructions  Activity: Get plenty of rest for the remainder of the day. A responsible individual must stay with you for 24 hours following the procedure.  For the next 24 hours, DO NOT: -Drive a car -Paediatric nurse -Drink alcoholic beverages -Take any medication unless instructed by your physician -Make  any legal decisions or sign important papers.  Meals: Start with liquid foods such as gelatin or soup. Progress to regular foods as tolerated. Avoid greasy, spicy, heavy foods. If nausea and/or vomiting occur, drink only clear liquids until the nausea and/or vomiting subsides. Call your physician if vomiting continues.  Special Instructions/Symptoms: Your throat may feel dry  or sore from the anesthesia or the breathing tube placed in your throat during surgery. If this causes discomfort, gargle with warm salt water. The discomfort should disappear within 24 hours.   **No ibuprofen, Advil, Aleve, Motrin, ketorolac, meloxicam, or naproxen until after 9pm today if needed.

## 2021-07-20 NOTE — H&P (Signed)
Office Visit Report     07/09/2021   --------------------------------------------------------------------------------   Bianca Phillips  MRN: [id number]  DOB: 06/16/1956, 65 year old Female  SSN:    PRIMARY CARE:  Enid Skeens, NP  REFERRING:  Scott A. MacDiarmid, MD  PROVIDER:  Alfredo Martinez, M.D.  TREATING:  Jettie Pagan, M.D.  LOCATION:  [facility] [phone]     --------------------------------------------------------------------------------   CC/HPI: Bianca Phillips is a 65 year old female seen in consultation today for right ureteral and left renal stones. She is being referred by Dr. Sherron Monday.   1. Urolithiasis: She has a remote history of urolithiasis and was able to pass her s/p spontaneously.  -CT A/P 07/02/2021 with 4 stacked calculi in the distal ureter measuring 8 mm. Also present were small nonobstructing stones in the left lower pole. She is asymptomatic from the left lower pole stones.  -She was found to have a urine culture 06/23/2021 with 20 K staph auricularis. She is completing Bactrim.  -She denies any pain. She denies fevers or chills. She denies dysuria.  -KUB 07/09/2021 with 8 mm stone seen in anticipated location of right distal ureter.  -After discussing options, she is elected proceed with right ureteroscopy with laser lithotripsy.   #2. Recurrent urinary tract infections: She has a history of recurrent urinary tract infections and follows with Dr. Sherron Monday.   Patient currently denies fever, chills, sweats, nausea, vomiting, abdominal or flank pain, gross hematuria or dysuria.     ALLERGIES: macrobid - Dizziness, Nausea    MEDICATIONS: Bactrim Ds 800 mg-160 mg tablet 1 tablet PO BID  Flomax 0.4 mg capsule 1 capsule PO Daily  Metformin Hcl  Aspirin Ec 81 mg tablet, delayed release  Atorvastatin Calcium  Glucosamine  Hydrocodone-Acetaminophen 5 mg-325 mg tablet 1-2 tablet PO Q 6 H PRN  Iron  Levothyroxine  Nortriptyline Hcl   Omega 3  Trimethoprim 100 mg tablet 1 tablet PO Q HS  Turmeric  Zoloft     GU PSH: Locm 300-399Mg /Ml Iodine,1Ml - 07/01/2021     NON-GU PSH: C-Section Eye Surgery (Unspecified) Hysterectomy Remove Kidney Stone     GU PMH: Hydronephrosis - 07/01/2021 Urinary Tract Inf, Unspec site - 07/01/2021, - 05/08/2021 Urinary Frequency - 05/08/2021    NON-GU PMH: Anxiety Depression Diabetes Type 2 GERD Hypercholesterolemia Hyperthyroidism    FAMILY HISTORY: 1 Daughter - Daughter 2 sons - Son Diabetes - Runs in Family father deceased - Father heart failure - Father mother deceased - Mother   SOCIAL HISTORY: Marital Status: Single Current Smoking Status: Patient does not smoke anymore. Has not smoked since 05/04/1989.   Tobacco Use Assessment Completed: Used Tobacco in last 30 days? Does not use smokeless tobacco. Has never drank.  Drinks 2 caffeinated drinks per day.    REVIEW OF SYSTEMS:    GU Review Female:   Patient denies frequent urination, hard to postpone urination, burning /pain with urination, get up at night to urinate, leakage of urine, stream starts and stops, trouble starting your stream, have to strain to urinate, and being pregnant.  Gastrointestinal (Upper):   Patient denies nausea, vomiting, and indigestion/ heartburn.  Gastrointestinal (Lower):   Patient denies diarrhea and constipation.  Constitutional:   Patient denies fever, night sweats, weight loss, and fatigue.  Skin:   Patient denies skin rash/ lesion and itching.  Eyes:   Patient denies blurred vision and double vision.  Ears/ Nose/ Throat:   Patient denies sore throat and sinus problems.  Hematologic/Lymphatic:   Patient  denies swollen glands and easy bruising.  Cardiovascular:   Patient denies leg swelling and chest pains.  Respiratory:   Patient denies cough and shortness of breath.  Endocrine:   Patient denies excessive thirst.  Musculoskeletal:   Patient denies joint pain and back pain.   Neurological:   Patient denies headaches and dizziness.  Psychologic:   Patient denies depression and anxiety.   VITAL SIGNS: None   MULTI-SYSTEM PHYSICAL EXAMINATION:    Constitutional: Well-nourished. No physical deformities. Normally developed. Good grooming.  Respiratory: No labored breathing, no use of accessory muscles.   Cardiovascular: Normal temperature, normal extremity pulses, no swelling, no varicosities.  Gastrointestinal: No mass, no tenderness, no rigidity, non obese abdomen. No CVA tenderness.     Complexity of Data:  Source Of History:  Patient, Medical Record Summary  Urine Test Review:   Urinalysis  X-Ray Review: C.T. Abdomen/Pelvis: Reviewed Films. Reviewed Report. Discussed With Patient.    Notes:                     CLINICAL DATA: Hydronephrosis   EXAM:  CT ABDOMEN AND PELVIS WITHOUT AND WITH CONTRAST   TECHNIQUE:  Multidetector CT imaging of the abdomen and pelvis was performed  following the standard protocol before and following the bolus  administration of intravenous contrast.   CONTRAST: 85 mL Omnipaque 350 iodinated contrast IV   COMPARISON: Renal ultrasound, 07/01/2021   FINDINGS:  Lower chest: No acute abnormality. Small hiatal hernia.   Hepatobiliary: No solid liver abnormality is seen. No gallstones,  gallbladder wall thickening, or biliary dilatation.   Pancreas: Unremarkable. No pancreatic ductal dilatation or  surrounding inflammatory changes.   Spleen: Normal in size without significant abnormality.   Adrenals/Urinary Tract: Adrenal glands are unremarkable. Severe  right hydronephrosis and hydroureter to the ureterovesicular  junction, with four stacked calculi in the most distal ureter  measuring up to 0.8 cm (series 604, image 89). Additional  nonobstructive calculi of the inferior pole of the left kidney. No  other urinary tract filling defect on delayed phase imaging. Bladder  is unremarkable.   Stomach/Bowel: Stomach is  within normal limits. Appendix appears  normal. No evidence of bowel wall thickening, distention, or  inflammatory changes. Large burden of stool throughout the colon.   Vascular/Lymphatic: Aortic atherosclerosis. No enlarged abdominal or  pelvic lymph nodes.   Reproductive: Status post hysterectomy.   Other: No abdominal wall hernia or abnormality. No abdominopelvic  ascites.   Musculoskeletal: No acute or significant osseous findings.   IMPRESSION:  1. Severe right hydronephrosis and hydroureter to the  ureterovesicular junction, with four stacked calculi in the most  distal ureter measuring up to 0.8 cm.  2. Additional nonobstructive calculi of the inferior pole of the  left kidney.  3. Large burden of stool throughout the colon.   Aortic Atherosclerosis (ICD10-I70.0).    Electronically Signed  By: Jearld Lesch M.D.  On: 07/02/2021 16:15     PROCEDURES:         KUB - 74018  A single view of the abdomen is obtained.      Patient confirmed No Neulasta OnPro Device.            Urinalysis w/Scope Dipstick Dipstick Cont'd Micro  Color: Yellow Bilirubin: Neg mg/dL WBC/hpf: 10 - 43/PIR  Appearance: Clear Ketones: Neg mg/dL RBC/hpf: 3 - 51/OAC  Specific Gravity: 1.025 Blood: Neg ery/uL Bacteria: Rare (0-9/hpf)  pH: 5.5 Protein: Neg mg/dL Cystals: NS (Not Seen)  Glucose:  Neg mg/dL Urobilinogen: 0.2 mg/dL Casts: NS (Not Seen)    Nitrites: Neg Trichomonas: Not Present    Leukocyte Esterase: 2+ leu/uL Mucous: Present      Epithelial Cells: 0 - 5/hpf      Yeast: Few (1 - 5/hpf)      Sperm: Not Present    ASSESSMENT:      ICD-10 Details  1 GU:   Hydronephrosis - N13.0   2   Ureteral calculus - N20.1    PLAN:           Schedule Return Visit/Planned Activity: Return PRN - Schedule Surgery          Document Letter(s):  Created for Patient: Clinical Summary         Notes:   #1. Urolithiasis  --CT A/P 07/02/2021 with 4 stacked calculi in the distal ureter  measuring 8 mm. Also present were small nonobstructing stones in the left lower pole. She is asymptomatic from the left lower pole stones.  -I was able to see stone on KUB 07/09/2021. I discussed options of ESWL or ureteroscopy. She has elected proceed with right ureteroscopy with lithotripsy and best extraction of stone.  -Urine sent for culture.  -She remains on Bactrim from prior positive culture.  -Return precautions discussed. She knows to present if she develops fevers or severe pain.  -Surgery letter sent   #2. Recurrent urinary tract infections: This is under the care of Dr. Sherron Monday. She is completing course of Bactrim for a recent UTI.   CC: Alfredo Martinez, MD     Signed by Jettie Pagan, M.D. on 07/09/21 at 1:29 PM (EDT)  Urology Preoperative H&P   Chief Complaint: right ureteral stones  History of Present Illness: Bianca Phillips is a 65 y.o. female with right ureteral stones here for cysto, R URS/LL, R RPG, R stent.    Past Medical History:  Diagnosis Date   Constipation    GAD (generalized anxiety disorder)    Heart murmur    Heart palpitations    evaluated by cardiologist--- dr h. Shari Prows, note in epic 04-03-2021, normal echo and monitor showed NSR w/ SVT   History of COVID-19    per pt had covid twice in 12/ 2020 with mild symptoms with residual intermittant loss of taste;  and 04/ 2022 with mild symptoms that resolved   History of hyperthyroidism    age 88 s/p RAI   History of kidney stones    History of recurrent UTIs    History of repair of hiatal hernia 02/2020   Hyperlipidemia    Hypothyroidism, postablative    age 46  s/p RAI for hyperthyroidism;  followed by pcp   IDA (iron deficiency anemia)    MDD (major depressive disorder)    OA (osteoarthritis)    Right ureteral calculus    Seasonal allergic rhinitis    Type 2 diabetes mellitus (HCC)    followed by pcp    (07-14-2021  per pt checks blood sugar at home 2-3 times weekly,  fasting sugar  --120-130)   Urgency of urination    Wears hearing aid in both ears     Past Surgical History:  Procedure Laterality Date   CESAREAN SECTION     x2  last one 1986   CYSTOSCOPY/URETEROSCOPY/HOLMIUM LASER/STENT PLACEMENT  2012   approx   LAPAROSCOPIC NISSEN FUNDOPLICATION  02/2020   TOTAL ABDOMINAL HYSTERECTOMY W/ BILATERAL SALPINGOOPHORECTOMY  2002    Allergies:  Allergies  Allergen Reactions   Curare [Tubocurarine]  Other (See Comments)    Per pt was awake and aware that she had paralysis and could not breath    History reviewed. No pertinent family history.  Social History:  reports that she quit smoking about 34 years ago. Her smoking use included cigarettes. She has a 5.00 pack-year smoking history. She has never used smokeless tobacco. She reports that she does not currently use alcohol. She reports that she does not use drugs.  ROS: A complete review of systems was performed.  All systems are negative except for pertinent findings as noted.  Physical Exam:  Vital signs in last 24 hours:   Constitutional:  Alert and oriented, No acute distress Cardiovascular: Regular rate and rhythm Respiratory: Normal respiratory effort, Lungs clear bilaterally GI: Abdomen is soft, nontender, nondistended, no abdominal masses GU: No CVA tenderness Lymphatic: No lymphadenopathy Neurologic: Grossly intact, no focal deficits Psychiatric: Normal mood and affect  Laboratory Data:  No results for input(s): WBC, HGB, HCT, PLT in the last 72 hours.  No results for input(s): NA, K, CL, GLUCOSE, BUN, CALCIUM, CREATININE in the last 72 hours.  Invalid input(s): CO3   No results found for this or any previous visit (from the past 24 hour(s)). No results found for this or any previous visit (from the past 240 hour(s)).  Renal Function: No results for input(s): CREATININE in the last 168 hours. CrCl cannot be calculated (Patient's most recent lab result is older than the maximum 21 days  allowed.).  Radiologic Imaging: No results found.  I independently reviewed the above imaging studies.  Assessment and Plan Bianca Phillips is a 65 y.o. female with right ureteral stones here for cysto, R URS/LL, R RPG, R stent.  -The risks, benefits and alternatives of cysto, R URS/LL, R RPG, R stent was discussed with the patient.  Risks include, but are not limited to: bleeding, urinary tract infection, ureteral injury, ureteral stricture disease, chronic pain, urinary symptoms, bladder injury, stent migration, the need for nephrostomy tube placement, MI, CVA, DVT, PE and the inherent risks with general anesthesia.  The patient voices understanding and wishes to proceed.       Matt R. Malania Gawthrop MD 07/20/2021, 12:57 PM  [facility] Pager: [phone]

## 2021-07-20 NOTE — Anesthesia Preprocedure Evaluation (Signed)
Anesthesia Evaluation  Patient identified by MRN, date of birth, ID band Patient awake    Reviewed: Allergy & Precautions, NPO status , Patient's Chart, lab work & pertinent test results  Airway Mallampati: II  TM Distance: >3 FB Neck ROM: Full    Dental no notable dental hx.    Pulmonary neg pulmonary ROS, former smoker,    Pulmonary exam normal breath sounds clear to auscultation       Cardiovascular Normal cardiovascular exam+ dysrhythmias Supra Ventricular Tachycardia  Rhythm:Regular Rate:Normal     Neuro/Psych negative neurological ROS  negative psych ROS   GI/Hepatic negative GI ROS, Neg liver ROS,   Endo/Other  diabetes, Type 2Hypothyroidism   Renal/GU Renal InsufficiencyRenal disease  negative genitourinary   Musculoskeletal negative musculoskeletal ROS (+)   Abdominal   Peds negative pediatric ROS (+)  Hematology negative hematology ROS (+)   Anesthesia Other Findings   Reproductive/Obstetrics negative OB ROS                             Anesthesia Physical Anesthesia Plan  ASA: 2  Anesthesia Plan: General   Post-op Pain Management:    Induction: Intravenous  PONV Risk Score and Plan: 3 and Ondansetron, Dexamethasone and Treatment may vary due to age or medical condition  Airway Management Planned: LMA  Additional Equipment:   Intra-op Plan:   Post-operative Plan: Extubation in OR  Informed Consent: I have reviewed the patients History and Physical, chart, labs and discussed the procedure including the risks, benefits and alternatives for the proposed anesthesia with the patient or authorized representative who has indicated his/her understanding and acceptance.     Dental advisory given  Plan Discussed with: CRNA and Surgeon  Anesthesia Plan Comments:         Anesthesia Quick Evaluation

## 2021-07-20 NOTE — Op Note (Signed)
Operative Note  Preoperative diagnosis:  1.  Right ureteral stone  Postoperative diagnosis: 1.  Right ureteral stone  Procedure(s): 1.  Cystoscopy 2. Right ureteroscopy with laser lithotripsy and basket extraction of stones 3. Right retrograde pyelogram 4. Right ureteral stent placement 5. Fluoroscopy with intraoperative interpretation  Surgeon: Jettie Pagan, MD  Assistants:  None  Anesthesia:  General  Complications:  None  EBL:  Minimal  Specimens: 1. Stones for stone analysis (to be done at [facility])  Drains/Catheters: 1.  Right 6Fr x 26cm ureteral stent on a tether string  Intraoperative findings:   Cystoscopy demonstrated no suspicious bladder lesions. Right ureteroscopy demonstrated and impacted distal right ureteral stone measuring at least 1cm. Significant surrounding inflammation.  Successful right ureteral stent placement.  Indication:  Bianca Phillips is a 65 y.o. female with a history of a distal right ureteral stone here for definitive management.  Description of procedure: After informed consent was obtained from the patient, the patient was identified and taken to the operating room and placed in the supine position.  General anesthesia was administered as well as perioperative IV antibiotics.  At the beginning of the case, a time-out was performed to properly identify the patient, the surgery to be performed, and the surgical site.  Sequential compression devices were applied to the lower extremities at the beginning of the case for DVT prophylaxis.  The patient was then placed in the dorsal lithotomy supine position, prepped and draped in sterile fashion.  Preliminary scout fluoroscopy revealed that there was a 89mm calcification area at the distal right ureteral stone, which corresponds to the stone found on the preoperative CT scan. We then passed the 21-French rigid cystoscope through the urethra and into the bladder under vision without any  difficulty, noting a normal urethra without strictures.  A systematic evaluation of the bladder revealed no evidence of any suspicious bladder lesions.  Ureteral orifices were in normal position.    Under cystoscopic and flouroscopic guidance, we cannulated the right ureteral orifice with a 5-French open-ended ureteral catheter and passed a sensor wire up to the right kidney with some difficulty as the stone was impacted. A gentle retrograde pyelogram was performed, revealing a normal caliber ureter without any filling defects. There was moderate right hydronephrosis of the collecting system. A 0.038 sensor wire was then passed up to the level of the renal pelvis and secured to the drape as a safety wire. The ureteral catheter and cystoscope were removed, leaving the safety wire in place.   A semi-rigid ureteroscope was passed alongside the wire up the distal ureter where we encountered an impacted 1cm distal right ureteral stone. Using the 200 micron holmium laser fiber, the stone was fragmented completely. A 2.2 Fr zero tip basket was used to remove the fragments under visual guidance. These were sent for chemical analysis. I passed a flexible scope into the kidney. With the ureteroscope in the kidney, a gentle pyelogram was performed to delineate the calyceal system and we evaluated the calyces systematically. We encountered no further stones. The calyces were re-inspected and there was no stone fragment residual.   We then withdrew the ureteroscope back down the ureter noting no evidence of any stones along the course of the ureter.  Prior to removing the ureteroscope, we did pass the Glidewire back up to the ureter to the renal pelvis.  Once the ureteroscope was removed, we then used the Glidewire under fluoroscopic guidance and passed up a 6-French x 26 cm double-pigtail ureteral  stent up the ureter, making sure that the proximal and distal ends coiled within the kidney and bladder respectively.  Note  that we left a long tether string attached to the distal end of the ureteral stent and it exited the urethral meatus and was secured to the inner thigh with a tegaderm adhesive.  The cystoscope was then advanced back into the bladder under vision.  We were able to see the distal stent coiling nicely within the bladder.  The bladder was then emptied with irrigation solution.  The cystoscope was then removed.    The patient tolerated the procedure well and there was no complication. Patient was awoken from anesthesia and taken to the recovery room in stable condition. I was present and scrubbed for the entirety of the case.  Plan:  Patient will be discharged home.  She will remove stent on Thursday AM.   Matt R. Malaney Mcbean MD [facility]  Pager: [phone]

## 2021-07-21 ENCOUNTER — Encounter (HOSPITAL_BASED_OUTPATIENT_CLINIC_OR_DEPARTMENT_OTHER): Payer: Self-pay | Admitting: Urology

## 2021-07-21 NOTE — Anesthesia Postprocedure Evaluation (Signed)
Anesthesia Post Note  Patient: Bianca Phillips  Procedure(s) Performed: CYSTOSCOPY/RETROGRADE/URETEROSCOPY/HOLMIUM LASER/STENT PLACEMENT (Right: Pelvis)     Patient location during evaluation: PACU Anesthesia Type: General Level of consciousness: awake and alert Pain management: pain level controlled Vital Signs Assessment: post-procedure vital signs reviewed and stable Respiratory status: spontaneous breathing, nonlabored ventilation, respiratory function stable and patient connected to nasal cannula oxygen Cardiovascular status: blood pressure returned to baseline and stable Postop Assessment: no apparent nausea or vomiting Anesthetic complications: no   No notable events documented.  Last Vitals:  Vitals:   07/20/21 1530 07/20/21 1554  BP: (!) 154/72 (!) 148/68  Pulse: (!) 104 95  Resp: 20 18  Temp: 36.7 C 36.7 C  SpO2: 98% 100%    Last Pain:  Vitals:   07/20/21 1554  TempSrc:   PainSc: 2                  Araiyah Cumpton S

## 2021-07-24 DIAGNOSIS — N201 Calculus of ureter: Secondary | ICD-10-CM | POA: Diagnosis not present

## 2021-07-28 ENCOUNTER — Telehealth (HOSPITAL_BASED_OUTPATIENT_CLINIC_OR_DEPARTMENT_OTHER): Payer: Self-pay

## 2021-07-28 DIAGNOSIS — Z1231 Encounter for screening mammogram for malignant neoplasm of breast: Secondary | ICD-10-CM

## 2021-07-28 NOTE — Telephone Encounter (Signed)
Patient called in to inquire about getting a screening mammogram She states that about a year and a half ago when she lived in [place] she had a breast biopsy completed and was unsure if she was supposed to have a mammogram annually following Being that we do not have record of the imaging or biopsy report I informed patient that we could order a screening mammogram She is aware of locations of where imaging can be completed Discussed with [name], DNP and she is agreeable to plan Orders placed

## 2021-08-03 ENCOUNTER — Other Ambulatory Visit: Payer: Self-pay | Admitting: *Deleted

## 2021-08-03 MED ORDER — METOPROLOL TARTRATE 25 MG PO TABS: 25.0000 mg | ORAL_TABLET | Freq: Two times a day (BID) | ORAL | 5 refills | Status: DC | PRN

## 2021-08-20 DIAGNOSIS — N390 Urinary tract infection, site not specified: Secondary | ICD-10-CM | POA: Diagnosis not present

## 2021-08-20 DIAGNOSIS — N200 Calculus of kidney: Secondary | ICD-10-CM | POA: Diagnosis not present

## 2021-08-26 ENCOUNTER — Telehealth (HOSPITAL_BASED_OUTPATIENT_CLINIC_OR_DEPARTMENT_OTHER): Payer: Self-pay

## 2021-08-26 MED ORDER — AZITHROMYCIN 250 MG PO TABS
ORAL_TABLET | ORAL | 0 refills | Status: DC
Start: 2021-08-26 — End: 2021-09-02

## 2021-08-26 NOTE — Telephone Encounter (Signed)
Patient called in to say that she has been having congestion, nasal and sinus congestion, and coughing up greenish yellow phlegm for the past 4-5 days She denies chest pain and shortness of breath She has not tested herself for Covid She has not tested for the flu or strep She denies fever Patient informed there are no available appointments to see [facility] before the holiday closing Patient advised to contact on call provider over the weekend if symptoms worsen or report to urgent care Per [name], DNP ok to azithromycin taper Patient is aware and agreeable to recommendations

## 2021-08-29 ENCOUNTER — Encounter (HOSPITAL_COMMUNITY): Payer: Self-pay | Admitting: Emergency Medicine

## 2021-08-29 ENCOUNTER — Inpatient Hospital Stay (HOSPITAL_COMMUNITY)
Admission: EM | Admit: 2021-08-29 | Discharge: 2021-09-02 | DRG: 522 | Disposition: A | Discharge status: Home Health | Payer: Medicare Other | Attending: Internal Medicine | Admitting: Internal Medicine

## 2021-08-29 ENCOUNTER — Emergency Department (HOSPITAL_COMMUNITY): Payer: Medicare Other

## 2021-08-29 ENCOUNTER — Other Ambulatory Visit: Payer: Self-pay

## 2021-08-29 DIAGNOSIS — W1830XA Fall on same level, unspecified, initial encounter: Secondary | ICD-10-CM

## 2021-08-29 DIAGNOSIS — M1990 Unspecified osteoarthritis, unspecified site: Secondary | ICD-10-CM | POA: Diagnosis present

## 2021-08-29 DIAGNOSIS — I471 Supraventricular tachycardia: Secondary | ICD-10-CM | POA: Diagnosis present

## 2021-08-29 DIAGNOSIS — F418 Other specified anxiety disorders: Secondary | ICD-10-CM | POA: Diagnosis present

## 2021-08-29 DIAGNOSIS — E1122 Type 2 diabetes mellitus with diabetic chronic kidney disease: Secondary | ICD-10-CM | POA: Diagnosis present

## 2021-08-29 DIAGNOSIS — J019 Acute sinusitis, unspecified: Secondary | ICD-10-CM | POA: Diagnosis present

## 2021-08-29 DIAGNOSIS — Z87891 Personal history of nicotine dependence: Secondary | ICD-10-CM | POA: Diagnosis not present

## 2021-08-29 DIAGNOSIS — Z87442 Personal history of urinary calculi: Secondary | ICD-10-CM

## 2021-08-29 DIAGNOSIS — S72031A Displaced midcervical fracture of right femur, initial encounter for closed fracture: Secondary | ICD-10-CM | POA: Diagnosis not present

## 2021-08-29 DIAGNOSIS — E1169 Type 2 diabetes mellitus with other specified complication: Secondary | ICD-10-CM | POA: Diagnosis present

## 2021-08-29 DIAGNOSIS — S72001A Fracture of unspecified part of neck of right femur, initial encounter for closed fracture: Secondary | ICD-10-CM | POA: Diagnosis present

## 2021-08-29 DIAGNOSIS — R002 Palpitations: Secondary | ICD-10-CM | POA: Diagnosis present

## 2021-08-29 DIAGNOSIS — N1832 Chronic kidney disease, stage 3b: Secondary | ICD-10-CM | POA: Diagnosis present

## 2021-08-29 DIAGNOSIS — E890 Postprocedural hypothyroidism: Secondary | ICD-10-CM | POA: Diagnosis present

## 2021-08-29 DIAGNOSIS — Z79890 Hormone replacement therapy (postmenopausal): Secondary | ICD-10-CM | POA: Diagnosis not present

## 2021-08-29 DIAGNOSIS — S72001D Fracture of unspecified part of neck of right femur, subsequent encounter for closed fracture with routine healing: Secondary | ICD-10-CM | POA: Diagnosis not present

## 2021-08-29 DIAGNOSIS — E1165 Type 2 diabetes mellitus with hyperglycemia: Secondary | ICD-10-CM | POA: Diagnosis present

## 2021-08-29 DIAGNOSIS — F411 Generalized anxiety disorder: Secondary | ICD-10-CM | POA: Diagnosis present

## 2021-08-29 DIAGNOSIS — Z888 Allergy status to other drugs, medicaments and biological substances status: Secondary | ICD-10-CM | POA: Diagnosis not present

## 2021-08-29 DIAGNOSIS — Z01818 Encounter for other preprocedural examination: Secondary | ICD-10-CM | POA: Diagnosis not present

## 2021-08-29 DIAGNOSIS — Z7984 Long term (current) use of oral hypoglycemic drugs: Secondary | ICD-10-CM

## 2021-08-29 DIAGNOSIS — Z96641 Presence of right artificial hip joint: Secondary | ICD-10-CM | POA: Diagnosis not present

## 2021-08-29 DIAGNOSIS — Z87440 Personal history of urinary (tract) infections: Secondary | ICD-10-CM

## 2021-08-29 DIAGNOSIS — Z79899 Other long term (current) drug therapy: Secondary | ICD-10-CM

## 2021-08-29 DIAGNOSIS — E785 Hyperlipidemia, unspecified: Secondary | ICD-10-CM | POA: Diagnosis present

## 2021-08-29 DIAGNOSIS — Z8616 Personal history of COVID-19: Secondary | ICD-10-CM

## 2021-08-29 DIAGNOSIS — Y9301 Activity, walking, marching and hiking: Secondary | ICD-10-CM | POA: Diagnosis present

## 2021-08-29 DIAGNOSIS — W19XXXA Unspecified fall, initial encounter: Secondary | ICD-10-CM

## 2021-08-29 DIAGNOSIS — Z974 Presence of external hearing-aid: Secondary | ICD-10-CM

## 2021-08-29 DIAGNOSIS — M1612 Unilateral primary osteoarthritis, left hip: Secondary | ICD-10-CM | POA: Diagnosis not present

## 2021-08-29 DIAGNOSIS — Z20822 Contact with and (suspected) exposure to covid-19: Secondary | ICD-10-CM | POA: Diagnosis not present

## 2021-08-29 DIAGNOSIS — Z471 Aftercare following joint replacement surgery: Secondary | ICD-10-CM | POA: Diagnosis not present

## 2021-08-29 DIAGNOSIS — W1809XA Striking against other object with subsequent fall, initial encounter: Secondary | ICD-10-CM | POA: Diagnosis present

## 2021-08-29 DIAGNOSIS — Z419 Encounter for procedure for purposes other than remedying health state, unspecified: Secondary | ICD-10-CM

## 2021-08-29 DIAGNOSIS — R000 Tachycardia, unspecified: Secondary | ICD-10-CM | POA: Diagnosis not present

## 2021-08-29 DIAGNOSIS — Z7982 Long term (current) use of aspirin: Secondary | ICD-10-CM

## 2021-08-29 DIAGNOSIS — R531 Weakness: Secondary | ICD-10-CM | POA: Diagnosis not present

## 2021-08-29 DIAGNOSIS — Y92480 Sidewalk as the place of occurrence of the external cause: Secondary | ICD-10-CM

## 2021-08-29 DIAGNOSIS — D62 Acute posthemorrhagic anemia: Secondary | ICD-10-CM | POA: Diagnosis not present

## 2021-08-29 LAB — CBC WITH DIFFERENTIAL/PLATELET
Abs Immature Granulocytes: 0.04 10*3/uL (ref 0.00–0.07)
Basophils Absolute: 0 10*3/uL (ref 0.0–0.1)
Basophils Relative: 0 %
Eosinophils Absolute: 0.1 10*3/uL (ref 0.0–0.5)
Eosinophils Relative: 2 %
HCT: 40.7 % (ref 36.0–46.0)
Hemoglobin: 13.3 g/dL (ref 12.0–15.0)
Immature Granulocytes: 1 %
Lymphocytes Relative: 25 %
Lymphs Abs: 1.6 10*3/uL (ref 0.7–4.0)
MCH: 29.3 pg (ref 26.0–34.0)
MCHC: 32.7 g/dL (ref 30.0–36.0)
MCV: 89.6 fL (ref 80.0–100.0)
Monocytes Absolute: 0.5 10*3/uL (ref 0.1–1.0)
Monocytes Relative: 7 %
Neutro Abs: 4.3 10*3/uL (ref 1.7–7.7)
Neutrophils Relative %: 65 %
Platelets: 201 10*3/uL (ref 150–400)
RBC: 4.54 MIL/uL (ref 3.87–5.11)
RDW: 12.5 % (ref 11.5–15.5)
WBC: 6.5 10*3/uL (ref 4.0–10.5)
nRBC: 0 % (ref 0.0–0.2)

## 2021-08-29 LAB — BASIC METABOLIC PANEL
Anion gap: 8 (ref 5–15)
BUN: 19 mg/dL (ref 8–23)
CO2: 25 mmol/L (ref 22–32)
Calcium: 9.1 mg/dL (ref 8.9–10.3)
Chloride: 105 mmol/L (ref 98–111)
Creatinine, Ser: 1.08 mg/dL — ABNORMAL HIGH (ref 0.44–1.00)
GFR, Estimated: 56 mL/min — ABNORMAL LOW (ref >60)
Glucose, Bld: 161 mg/dL — ABNORMAL HIGH (ref 70–99)
Potassium: 3.9 mmol/L (ref 3.5–5.1)
Sodium: 138 mmol/L (ref 135–145)

## 2021-08-29 LAB — RESP PANEL BY RT-PCR (FLU A&B, COVID) ARPGX2
Influenza A by PCR: NEGATIVE
Influenza B by PCR: NEGATIVE
SARS Coronavirus 2 by RT PCR: NEGATIVE

## 2021-08-29 LAB — CBG MONITORING, ED: Glucose-Capillary: 148 mg/dL — ABNORMAL HIGH (ref 70–99)

## 2021-08-29 MED ORDER — DOCUSATE SODIUM 100 MG PO CAPS
200.0000 mg | ORAL_CAPSULE | Freq: Every day | ORAL | Status: DC
  Administered 2021-08-29 – 2021-08-31 (×2): 200 mg via ORAL
  Filled 2021-08-29 (×2): qty 2

## 2021-08-29 MED ORDER — VITAMIN D 25 MCG (1000 UNIT) PO TABS
1000.0000 [IU] | ORAL_TABLET | Freq: Every day | ORAL | Status: DC
  Administered 2021-08-29 – 2021-09-02 (×4): 1000 [IU] via ORAL
  Filled 2021-08-29 (×4): qty 1

## 2021-08-29 MED ORDER — HYDROCODONE-ACETAMINOPHEN 5-325 MG PO TABS
1.0000 | ORAL_TABLET | Freq: Four times a day (QID) | ORAL | Status: DC | PRN
  Administered 2021-08-29: 1 via ORAL
  Administered 2021-08-29 – 2021-09-02 (×12): 2 via ORAL
  Filled 2021-08-29 (×7): qty 2
  Filled 2021-08-29: qty 1
  Filled 2021-08-29 (×7): qty 2

## 2021-08-29 MED ORDER — ATORVASTATIN CALCIUM 40 MG PO TABS
40.0000 mg | ORAL_TABLET | Freq: Every day | ORAL | Status: DC
  Administered 2021-08-29 – 2021-09-01 (×4): 40 mg via ORAL
  Filled 2021-08-29 (×2): qty 1
  Filled 2021-08-29: qty 4
  Filled 2021-08-29: qty 1

## 2021-08-29 MED ORDER — LORATADINE 10 MG PO TABS
10.0000 mg | ORAL_TABLET | Freq: Every day | ORAL | Status: DC
  Administered 2021-08-31 – 2021-09-02 (×3): 10 mg via ORAL
  Filled 2021-08-29 (×4): qty 1

## 2021-08-29 MED ORDER — ALPHA-LIPOIC ACID 600 MG PO TABS: 1.0000 | ORAL_TABLET | Freq: Every evening | ORAL | Status: DC

## 2021-08-29 MED ORDER — FLUTICASONE PROPIONATE 50 MCG/ACT NA SUSP: 1.0000 | Freq: Every day | NASAL | Status: DC | PRN

## 2021-08-29 MED ORDER — AZITHROMYCIN 250 MG PO TABS
250.0000 mg | ORAL_TABLET | Freq: Every day | ORAL | Status: AC
  Administered 2021-08-29 – 2021-08-31 (×2): 250 mg via ORAL
  Filled 2021-08-29 (×3): qty 1

## 2021-08-29 MED ORDER — ALPRAZOLAM 0.25 MG PO TABS: 0.2500 mg | ORAL_TABLET | Freq: Every evening | ORAL | Status: DC | PRN

## 2021-08-29 MED ORDER — SENNOSIDES-DOCUSATE SODIUM 8.6-50 MG PO TABS
1.0000 | ORAL_TABLET | Freq: Every evening | ORAL | Status: DC | PRN
  Filled 2021-08-29: qty 1

## 2021-08-29 MED ORDER — SERTRALINE HCL 50 MG PO TABS
50.0000 mg | ORAL_TABLET | Freq: Every day | ORAL | Status: DC
  Administered 2021-08-31 – 2021-09-02 (×3): 50 mg via ORAL
  Filled 2021-08-29 (×4): qty 1

## 2021-08-29 MED ORDER — MORPHINE SULFATE (PF) 2 MG/ML IV SOLN
0.5000 mg | INTRAVENOUS | Status: DC | PRN
  Administered 2021-08-30: 03:00:00 0.5 mg via INTRAVENOUS
  Filled 2021-08-29: qty 1

## 2021-08-29 MED ORDER — LEVOTHYROXINE SODIUM 75 MCG PO TABS
150.0000 ug | ORAL_TABLET | Freq: Every day | ORAL | Status: DC
  Administered 2021-08-31 – 2021-09-02 (×3): 150 ug via ORAL
  Filled 2021-08-29 (×3): qty 2

## 2021-08-29 MED ORDER — ONDANSETRON HCL 4 MG/2ML IJ SOLN
4.0000 mg | Freq: Once | INTRAMUSCULAR | Status: AC
  Administered 2021-08-29: 4 mg via INTRAVENOUS
  Filled 2021-08-29: qty 2

## 2021-08-29 MED ORDER — METOPROLOL TARTRATE 25 MG PO TABS: 25.0000 mg | ORAL_TABLET | Freq: Two times a day (BID) | ORAL | Status: DC | PRN

## 2021-08-29 MED ORDER — ACETAMINOPHEN 325 MG PO TABS
650.0000 mg | ORAL_TABLET | Freq: Four times a day (QID) | ORAL | Status: DC | PRN
  Filled 2021-08-29: qty 2

## 2021-08-29 MED ORDER — NORTRIPTYLINE HCL 25 MG PO CAPS
50.0000 mg | ORAL_CAPSULE | Freq: Every day | ORAL | Status: DC
  Administered 2021-08-29 – 2021-09-01 (×4): 50 mg via ORAL
  Filled 2021-08-29 (×5): qty 2

## 2021-08-29 MED ORDER — ONDANSETRON HCL 4 MG/2ML IJ SOLN
4.0000 mg | Freq: Three times a day (TID) | INTRAMUSCULAR | Status: DC | PRN
  Administered 2021-08-29: 4 mg via INTRAVENOUS
  Filled 2021-08-29: qty 2

## 2021-08-29 MED ORDER — FENTANYL CITRATE PF 50 MCG/ML IJ SOSY
50.0000 ug | PREFILLED_SYRINGE | Freq: Once | INTRAMUSCULAR | Status: AC
  Administered 2021-08-29: 50 ug via INTRAVENOUS
  Filled 2021-08-29: qty 1

## 2021-08-29 MED ORDER — INSULIN ASPART 100 UNIT/ML IJ SOLN
0.0000 [IU] | Freq: Three times a day (TID) | INTRAMUSCULAR | Status: DC
  Administered 2021-08-30: 18:00:00 3 [IU] via SUBCUTANEOUS
  Administered 2021-08-31 – 2021-09-01 (×4): 2 [IU] via SUBCUTANEOUS
  Administered 2021-09-01: 1 [IU] via SUBCUTANEOUS
  Administered 2021-09-02: 2 [IU] via SUBCUTANEOUS
  Administered 2021-09-02: 1 [IU] via SUBCUTANEOUS

## 2021-08-29 MED ORDER — TRIMETHOPRIM 100 MG PO TABS
100.0000 mg | ORAL_TABLET | Freq: Every day | ORAL | Status: DC
  Administered 2021-09-01: 100 mg via ORAL
  Filled 2021-08-29 (×2): qty 1

## 2021-08-29 NOTE — Progress Notes (Signed)
Orthopedic Tech Progress Note Patient Details:  Bianca Phillips 01-07-1956 [id number]  Patient ID: [name], female   DOB: 10/03/1956, 65 y.o.   MRN: [id number] No OHF right now; pt is going to surgery in the AM. Darleen Crocker 08/29/2021, 5:24 PM

## 2021-08-29 NOTE — ED Notes (Signed)
Patient is resting comfortably. Ice pack applied to hip

## 2021-08-29 NOTE — H&P (Signed)
History and Physical    Bianca Phillips [id number] DOB: Jul 07, 1956 DOA: 08/29/2021  PCP: Orma Render, NP Consultants:  urology: Dr. Gloriann Loan, cardiology: Dr. Johney Frame  Patient coming from:  Home - lives alone   Chief Complaint: fall with right hip pain   HPI: Bianca Phillips is a 65 y.o. female with medical history significant of hypothyroidism, CKD stage 3b, T2DM, HLD, heart palpitations, anxiety who presented to ED after mechanical fall and subsequent inability to bear weight on right leg.  She was walking into the [place] and tripped on the sidewalk that had a ramp and fell forward onto her hands and she thinks right hip. She had immediate pain in her right hip and was unable to bear weight on it. Her daughter brought her to ED.   She has been feeling fine. She is on antibiotics for a sinus infection (zpack, day 3). She denies any fever/chills, shortness of breath or cough, chest pain/palpitations, stomach pain, N/V/D, leg swelling, dysuria.     ED Course: vitals: afebrile, bp: 164/95, HR: 104, RR: 17, oxygen: 99% on room air  Pertinent labs: creatinine: 1.08 (1.08-1.24), glucose: 161 Hip xray: impacted mid cervical right femoral neck fracture  CXR: no active disease In ED: ortho consulted. Given fentanyl and zofran. [facility] was asked to admit   Review of Systems: As per HPI; otherwise review of systems reviewed and negative.   Ambulatory Status:  Ambulates without assistance   Past Medical History:  Diagnosis Date   Complication of anesthesia    Constipation    GAD (generalized anxiety disorder)    Heart murmur    Heart palpitations    evaluated by cardiologist--- dr h. Johney Frame, note in epic 04-03-2021, normal echo and monitor showed NSR w/ SVT   History of COVID-19    per pt had covid twice in 12/ 2020 with mild symptoms with residual intermittant loss of taste;  and 04/ 2022 with mild symptoms that resolved   History of hyperthyroidism    age 62 s/p RAI    History of kidney stones    History of recurrent UTIs    History of repair of hiatal hernia 02/2020   Hyperlipidemia    Hypothyroidism, postablative    age 25  s/p RAI for hyperthyroidism;  followed by pcp   IDA (iron deficiency anemia)    MDD (major depressive disorder)    OA (osteoarthritis)    Right ureteral calculus    Seasonal allergic rhinitis    Type 2 diabetes mellitus ([place])    followed by pcp    (07-14-2021  per pt checks blood sugar at home 2-3 times weekly,  fasting sugar --120-130)   Urgency of urination    Wears hearing aid in both ears     Past Surgical History:  Procedure Laterality Date   CESAREAN SECTION     x2  last one 1986   CYSTOSCOPY/URETEROSCOPY/HOLMIUM LASER/STENT PLACEMENT  2012   approx   CYSTOSCOPY/URETEROSCOPY/HOLMIUM LASER/STENT PLACEMENT Right 07/20/2021   Procedure: CYSTOSCOPY/RETROGRADE/URETEROSCOPY/HOLMIUM LASER/STENT PLACEMENT;  Surgeon: Janith Lima, MD;  Location: [facility];  Service: Urology;  Laterality: Right;  ONLY NEEDS 45 MIN   LAPAROSCOPIC NISSEN FUNDOPLICATION  [id number]   TOTAL ABDOMINAL HYSTERECTOMY W/ BILATERAL SALPINGOOPHORECTOMY  2002    Social History   Socioeconomic History   Marital status: Divorced    Spouse name: Not on file   Number of children: 3   Years of education: 16   Highest education level: Bachelor's degree (e.g.,  BA, AB, BS)  Occupational History   Occupation: retired    Comment: Runner, broadcasting/film/video  Tobacco Use   Smoking status: Former    Packs/day: 0.50    Years: 10.00    Pack years: 5.00    Types: Cigarettes    Quit date: 1988    Years since quitting: 34.9   Smokeless tobacco: Never  Vaping Use   Vaping Use: Never used  Substance and Sexual Activity   Alcohol use: Not Currently   Drug use: Never   Sexual activity: Not Currently    Birth control/protection: Post-menopausal, Surgical  Other Topics Concern   Not on file  Social History Narrative   Not on file   Social Determinants of  Health   Financial Resource Strain: Not on file  Food Insecurity: Not on file  Transportation Needs: Not on file  Physical Activity: Not on file  Stress: Not on file  Social Connections: Not on file  Intimate Partner Violence: Not on file    Allergies  Allergen Reactions   Curare [Tubocurarine] Other (See Comments)    Per pt was awake and aware that she had paralysis and could not breath    No family history on file.  Prior to Admission medications   Medication Sig Start Date End Date Taking? Authorizing Provider  Alpha-Lipoic Acid 600 MG TABS Take 1 tablet by mouth every evening.    [provider]  ALPRAZolam Prudy Feeler) 0.25 MG tablet Take 1 tablet (0.25 mg total) by mouth at bedtime as needed for anxiety. Do not take with pain medication or other medication that causes drowsiness. 07/08/21   Tollie Eth, NP  Ascorbic Acid (VITAMIN C) 1000 MG tablet Take 1,000 mg by mouth daily.    [provider]  aspirin 81 MG chewable tablet Chew 81 mg by mouth daily.    [provider]  atorvastatin (LIPITOR) 40 MG tablet Take 1 tablet (40 mg total) by mouth at bedtime. 03/16/21   Tollie Eth, NP  azithromycin (ZITHROMAX) 250 MG tablet Take 2 tablets (500 mg) by mouth on day 1. Then take 1 tablet (250 mg) by mouth on days 2-5. 08/26/21   Early, Sung Amabile, NP  cholecalciferol (VITAMIN D3) 25 MCG (1000 UNIT) tablet Take 1,000 Units by mouth daily.    [provider]  docusate sodium (COLACE) 100 MG capsule Take 200 mg by mouth daily.    [provider]  ferrous sulfate 324 MG TBEC Take 324 mg by mouth daily.    [provider]  fluticasone (FLONASE) 50 MCG/ACT nasal spray Place into both nostrils daily as needed.    [provider]  glucose blood (ACCU-CHEK AVIVA PLUS) test strip USE TO CHECK BLOOD SUGAR EVERY DAY 07/08/21   Early, Sung Amabile, NP  HYDROcodone-acetaminophen (NORCO/VICODIN) 5-325 MG tablet Take 1-2 tablets by mouth every 6 (six)  hours as needed for up to 12 doses. 07/20/21   Jannifer Hick, MD  levothyroxine (SYNTHROID) 150 MCG tablet Take 1 tablet (150 mcg total) by mouth daily before breakfast. Patient taking differently: Take 150 mcg by mouth daily before breakfast. 04/27/21   Early, Sung Amabile, NP  meclizine (ANTIVERT) 12.5 MG tablet Take 12.5 mg by mouth every 6 (six) hours as needed. 12/29/20   [provider]  metFORMIN (GLUCOPHAGE) 500 MG tablet Take 1.5 tablets (750 mg total) by mouth daily with breakfast AND 1 tablet (500 mg total) every evening. 07/14/21   Tollie Eth, NP  metoprolol tartrate (  LOPRESSOR) 25 MG tablet Take 1 tablet (25 mg total) by mouth 2 (two) times daily as needed (palpitations). 08/03/21   Freada Bergeron, MD  Misc Natural Products (GLUCOSAMINE CHOND CMP ADVANCED PO) Take 2 capsules by mouth every evening.    [provider]  nortriptyline (PAMELOR) 50 MG capsule TAKE 1 CAPSULE BY MOUTH EVERY NIGHT AT BEDTIME. DISCONTINUE AMITRIPTYLINE Patient taking differently: Take 50 mg by mouth at bedtime. 04/27/21   Orma Render, NP  Omega-3 Fatty Acids (OMEGA-3 FISH OIL PO) Take 2 capsules by mouth every evening.    [provider]  Probiotic Product (PROBIOTIC DAILY) CAPS Take 1 capsule by mouth every evening.    [provider]  sertraline (ZOLOFT) 50 MG tablet Take 1 tablet (50 mg total) by mouth daily. 04/20/21   Orma Render, NP  tamsulosin (FLOMAX) 0.4 MG CAPS capsule Take 0.4 mg by mouth every evening. 07/01/21   [provider]  tamsulosin (FLOMAX) 0.4 MG CAPS capsule Take 1 capsule (0.4 mg total) by mouth daily. 07/20/21   Janith Lima, MD  trimethoprim (TRIMPEX) 100 MG tablet Take 100 mg by mouth at bedtime. 06/10/21   [provider]  Turmeric 500 MG CAPS Take 1 capsule by mouth every evening.    [provider]    Physical Exam: Vitals:   08/29/21 1123 08/29/21 1211 08/29/21 1215 08/29/21 1245  BP: (!) 164/95  139/64 (!)  158/68  Pulse: (!) 104  (!) 107 99  Resp: 17  19 (!) 24  Temp: 98.2 F (36.8 C)     TempSrc: Oral     SpO2: 99%  96% 95%  Weight:  66.7 kg    Height:  5\' 5"  (1.651 m)       General:  Appears calm and comfortable and is in NAD Eyes:  PERRL, EOMI, normal lids, iris ENT:  grossly normal hearing, lips & tongue, mmm; appropriate dentition Neck:  no LAD, masses or thyromegaly; no carotid bruits Cardiovascular:  RRR, no m/r/g. No LE edema.  Respiratory:   CTA bilaterally with no wheezes/rales/rhonchi.  Normal respiratory effort. Abdomen:  soft, NT, ND, NABS Back:   normal alignment Skin:  no rash or induration seen on limited exam Musculoskeletal:  grossly normal tone BUE/left LE good ROM, no bony abnormality. Right leg: shortened and abducted. Pedal pulses intact. Sensation intact. No bruising or ttp over right hip. Has small movement in knee.  Lower extremity:  No LE edema.  Limited foot exam with no ulcerations.  2+ distal pulses. Psychiatric:  grossly normal mood and affect, speech fluent and appropriate, AOx3 Neurologic:  CN 2-12 grossly intact, moves all extremities in coordinated fashion, sensation intact    Radiological Exams on Admission: Independently reviewed - see discussion in A/P where applicable  DG Chest Port 1 View  Result Date: 08/29/2021 CLINICAL DATA:  Pre-op. EXAM: PORTABLE CHEST 1 VIEW COMPARISON:  None. FINDINGS: The heart size and mediastinal contours are within normal limits. Both lungs are clear. No pleural effusion or pneumothorax. The visualized skeletal structures are unremarkable. IMPRESSION: No active disease. Electronically Signed   By: Ileana Roup M.D.   On: 08/29/2021 12:43   DG Hip Unilat W or Wo Pelvis 2-3 Views Right  Result Date: 08/29/2021 CLINICAL DATA:  [place] EXAM: DG HIP (WITH OR WITHOUT PELVIS) 2-3V RIGHT COMPARISON:  07/09/2021 FINDINGS: Impacted right mid cervical femoral neck fracture. No dislocation. Bony pelvis intact. IMPRESSION: 1.  Impacted mid cervical right femoral neck fracture.  Electronically Signed   By: Lucrezia Europe M.D.   On: 08/29/2021 12:10    EKG: pending release    Labs on Admission: I have personally reviewed the available labs and imaging studies at the time of the admission.  Pertinent labs:  creatinine: 1.08 (1.08-1.24) glucose: 161  Assessment/Plan Principal Problem:   Closed right hip fracture [facility]) -65 year old female presenting to ED after mechanical fall onto right hip with subsequent impacted id cervical right femoral neck fracture -admit to med-surg -ortho consulted with plan for surgery tomorrow (Dr. Sammuel Hines) -NPO at midnight, verified surgery tomorrow -SCDs -pain medication with tylenol, norco and morphine for severe pain -hip fracture protocol   Active Problems:   Chronic kidney disease (CKD) stage G3b -at baseline, continue to monitor   Type 2 diabetes mellitus with hyperglycemia, without long-term current use of insulin (HCC) -a1c of 6.6 in 02/2021 -hold metformin while inpatient -SSI with routine accuchecks -carb modified diet    Hyperlipidemia associated with type 2 diabetes mellitus ([place]) -lipid panel 5/22 and to goal  -continue lipitor    Postoperative hypothyroidism -continue synthroid -TSH wnl 5/22    Heart palpitations -was seen by cardiology once this summer in 7/22. Ziopatch x 7 days showed several SVTs and she was given metoprolol to take as needed, echo normal -she has very occasionally, prn metoprolol    Generalized anxiety disorder  Continue Zoloft   Acute sinusitis Continue 5 day course of azithromycin   Hx of nephrolithiasis -recent right ureteroscopy with laser lithotripsy by urology for right renal stone in 10/22 -otherwise remote hx with spontaneous passage of stone  Recurrent UTI Follows with Dr. Matilde Sprang   Body mass index is 24.46 kg/m.    Level of care: Med-Surg DVT prophylaxis:  SCDs Code Status:  Full - confirmed with  patient Family Communication: daughter at bedside: melissa brewster   Disposition Plan:  The patient is from: home  Anticipated d/c is to: per day team  Requires inpatient hospitalization as anticipate >2 midnight stay for surgery for hip fracture,  constant monitoring, assessment and MDM with specialists.    Patient is currently: stable Consults called: orthopedics  Admission status:  inpatient    Orma Flaming MD [facility]   How to contact the [facility] Attending or Consulting provider [place] or covering provider during after hours Friend, for this patient?  Check the care team in [facility] and look for a) attending/consulting [facility] provider listed and b) the [facility] team listed Log into [email or web address] and use [place] universal password to access. If you do not have the password, please contact the hospital operator. Locate the [facility] provider you are looking for under [facility] and page to a number that you can be directly reached. If you still have difficulty reaching the provider, please page the [facility] (Director on Call) for the Hospitalists listed on amion for assistance.   08/29/2021, 1:46 PM

## 2021-08-29 NOTE — ED Provider Notes (Signed)
[facility] EMERGENCY DEPARTMENT Provider Note   CSN: [id number] Arrival date & time: 08/29/21  1112     History Chief Complaint  Patient presents with   Fall   Hip Pain    Bianca Phillips is a 65 y.o. female presenting for evaluation of right hip pain after fall.  Patient states around 1045 today she tripped on uneven ground, landing on her right side.  She not hit her head or lose consciousness.  She reports cute onset right hip pain.  She has been unable to ambulate since due to pain.  She has not had anything for it.  Pain is worse with movement, nothing makes it better.  No numbness or tingling.  She is normally blood thinners.  No headache, neck pain, chest pain, shortness of breath, loss of bowel or bladder control.   HPI     Past Medical History:  Diagnosis Date   Complication of anesthesia    Constipation    GAD (generalized anxiety disorder)    Heart murmur    Heart palpitations    evaluated by cardiologist--- dr h. Shari Prows, note in epic 04-03-2021, normal echo and monitor showed NSR w/ SVT   History of COVID-19    per pt had covid twice in 12/ 2020 with mild symptoms with residual intermittant loss of taste;  and 04/ 2022 with mild symptoms that resolved   History of hyperthyroidism    age 20 s/p RAI   History of kidney stones    History of recurrent UTIs    History of repair of hiatal hernia 02/2020   Hyperlipidemia    Hypothyroidism, postablative    age 59  s/p RAI for hyperthyroidism;  followed by pcp   IDA (iron deficiency anemia)    MDD (major depressive disorder)    OA (osteoarthritis)    Right ureteral calculus    Seasonal allergic rhinitis    Type 2 diabetes mellitus (HCC)    followed by pcp    (07-14-2021  per pt checks blood sugar at home 2-3 times weekly,  fasting sugar --120-130)   Urgency of urination    Wears hearing aid in both ears     Patient Active Problem List   Diagnosis Date Noted   Closed right hip fracture  (HCC) 08/29/2021   Insomnia 06/24/2021   Candida lusitanea infection 03/16/2021   Chronic kidney disease (CKD) stage G3b/A2, moderately decreased glomerular filtration rate (GFR) between 30-44 mL/min/1.73 square meter and albuminuria creatinine ratio between 30-299 mg/g (HCC) 03/16/2021   Acute midline low back pain without sciatica 02/24/2021   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   Tachycardia 02/05/2021   Itching in the vaginal area 02/05/2021   Generalized anxiety disorder 02/05/2021   Depression, major, single episode, mild (HCC) 02/05/2021   Heart palpitations 02/05/2021   Adhesive capsulitis of right shoulder 02/05/2021   Urinary frequency 02/05/2021   Type 2 diabetes mellitus with hyperglycemia, without long-term current use of insulin (HCC) 02/05/2021   Postoperative hypothyroidism 02/05/2021   Hyperlipidemia associated with type 2 diabetes mellitus (HCC) 02/05/2021   Intractable migraine without aura and without status migrainosus 02/05/2021   Seasonal allergic rhinitis due to pollen 02/05/2021   Vision changes 02/05/2021    Past Surgical History:  Procedure Laterality Date   CESAREAN SECTION     x2  last one 1986   CYSTOSCOPY/URETEROSCOPY/HOLMIUM LASER/STENT PLACEMENT  2012   approx   CYSTOSCOPY/URETEROSCOPY/HOLMIUM LASER/STENT PLACEMENT Right 07/20/2021   Procedure: CYSTOSCOPY/RETROGRADE/URETEROSCOPY/HOLMIUM LASER/STENT PLACEMENT;  Surgeon: Cardell Peach,  Lesia SagoMatthew R, MD;  [facility];  Service: Urology;  Laterality: Right;  ONLY NEEDS 45 MIN   LAPAROSCOPIC NISSEN FUNDOPLICATION  02/2020   TOTAL ABDOMINAL HYSTERECTOMY W/ BILATERAL SALPINGOOPHORECTOMY  2002     OB History   No obstetric history on file.     No family history on file.  Social History   Tobacco Use   Smoking status: Former    Packs/day: 0.50    Years: 10.00    Pack years: 5.00    Types: Cigarettes    Quit date: 1988    Years since quitting: 34.9   Smokeless tobacco: Never   Vaping Use   Vaping Use: Never used  Substance Use Topics   Alcohol use: Not Currently   Drug use: Never    Home Medications Prior to Admission medications   Medication Sig Start Date End Date Taking? Authorizing Provider  Alpha-Lipoic Acid 600 MG TABS Take 1 tablet by mouth every evening.    [provider]  ALPRAZolam Prudy Feeler(XANAX) 0.25 MG tablet Take 1 tablet (0.25 mg total) by mouth at bedtime as needed for anxiety. Do not take with pain medication or other medication that causes drowsiness. 07/08/21   Tollie EthEarly, Sara E, NP  Ascorbic Acid (VITAMIN C) 1000 MG tablet Take 1,000 mg by mouth daily.    [provider]  aspirin 81 MG chewable tablet Chew 81 mg by mouth daily.    [provider]  atorvastatin (LIPITOR) 40 MG tablet Take 1 tablet (40 mg total) by mouth at bedtime. 03/16/21   Tollie EthEarly, Sara E, NP  azithromycin (ZITHROMAX) 250 MG tablet Take 2 tablets (500 mg) by mouth on day 1. Then take 1 tablet (250 mg) by mouth on days 2-5. 08/26/21   Early, Sung AmabileSara E, NP  cholecalciferol (VITAMIN D3) 25 MCG (1000 UNIT) tablet Take 1,000 Units by mouth daily.    [provider]  docusate sodium (COLACE) 100 MG capsule Take 200 mg by mouth daily.    [provider]  ferrous sulfate 324 MG TBEC Take 324 mg by mouth daily.    [provider]  fluticasone (FLONASE) 50 MCG/ACT nasal spray Place into both nostrils daily as needed.    [provider]  glucose blood (ACCU-CHEK AVIVA PLUS) test strip USE TO CHECK BLOOD SUGAR EVERY DAY 07/08/21   Early, Sung AmabileSara E, NP  HYDROcodone-acetaminophen (NORCO/VICODIN) 5-325 MG tablet Take 1-2 tablets by mouth every 6 (six) hours as needed for up to 12 doses. 07/20/21   Jannifer HickGay, Matthew R, MD  levothyroxine (SYNTHROID) 150 MCG tablet Take 1 tablet (150 mcg total) by mouth daily before breakfast. Patient taking differently: Take 150 mcg by mouth daily before breakfast. 04/27/21   Early, Sung AmabileSara E, NP  meclizine (ANTIVERT) 12.5 MG  tablet Take 12.5 mg by mouth every 6 (six) hours as needed. 12/29/20   [provider]  metFORMIN (GLUCOPHAGE) 500 MG tablet Take 1.5 tablets (750 mg total) by mouth daily with breakfast AND 1 tablet (500 mg total) every evening. 07/14/21   Tollie EthEarly, Sara E, NP  metoprolol tartrate (LOPRESSOR) 25 MG tablet Take 1 tablet (25 mg total) by mouth 2 (two) times daily as needed (palpitations). 08/03/21   Meriam SpraguePemberton, Heather E, MD  Misc Natural Products (GLUCOSAMINE CHOND CMP ADVANCED PO) Take 2 capsules by mouth every evening.    [provider]  nortriptyline (PAMELOR) 50 MG capsule TAKE 1 CAPSULE BY MOUTH EVERY NIGHT AT BEDTIME. DISCONTINUE AMITRIPTYLINE Patient taking differently: Take  50 mg by mouth at bedtime. 04/27/21   Orma Render, NP  Omega-3 Fatty Acids (OMEGA-3 FISH OIL PO) Take 2 capsules by mouth every evening.    [provider]  Probiotic Product (PROBIOTIC DAILY) CAPS Take 1 capsule by mouth every evening.    [provider]  sertraline (ZOLOFT) 50 MG tablet Take 1 tablet (50 mg total) by mouth daily. 04/20/21   Orma Render, NP  tamsulosin (FLOMAX) 0.4 MG CAPS capsule Take 0.4 mg by mouth every evening. 07/01/21   [provider]  tamsulosin (FLOMAX) 0.4 MG CAPS capsule Take 1 capsule (0.4 mg total) by mouth daily. 07/20/21   Janith Lima, MD  trimethoprim (TRIMPEX) 100 MG tablet Take 100 mg by mouth at bedtime. 06/10/21   [provider]  Turmeric 500 MG CAPS Take 1 capsule by mouth every evening.    [provider]    Allergies    Curare [tubocurarine]  Review of Systems   Review of Systems  Musculoskeletal:  Positive for arthralgias.  All other systems reviewed and are negative.  Physical Exam Updated Vital Signs BP (!) 158/68   Pulse 99   Temp 98.2 F (36.8 C) (Oral)   Resp (!) 24   Ht 5\' 5"  (1.651 m)   Wt 66.7 kg   SpO2 95%   BMI 24.46 kg/m   Physical Exam Vitals and nursing note reviewed.  Constitutional:       General: She is not in acute distress.    Appearance: Normal appearance.     Comments: Nontoxic  HENT:     Head: Normocephalic and atraumatic.  Eyes:     Conjunctiva/sclera: Conjunctivae normal.     Pupils: Pupils are equal, round, and reactive to light.  Cardiovascular:     Rate and Rhythm: Normal rate and regular rhythm.     Pulses: Normal pulses.  Pulmonary:     Effort: Pulmonary effort is normal. No respiratory distress.     Breath sounds: Normal breath sounds. No wheezing.     Comments: Speaking in full sentences.  Clear lung sounds in all fields. Abdominal:     General: There is no distension.     Palpations: Abdomen is soft.     Tenderness: There is no abdominal tenderness.  Musculoskeletal:        General: Normal range of motion.     Cervical back: Normal range of motion and neck supple.     Comments: Right leg shortened and externally rotated.  Pedal pulses 2+ bilaterally.  Tenderness palpation of the proximal right leg.  Skin:    General: Skin is warm and dry.     Capillary Refill: Capillary refill takes less than 2 seconds.  Neurological:     Mental Status: She is alert and oriented to person, place, and time.  Psychiatric:        Mood and Affect: Mood and affect normal.        Speech: Speech normal.        Behavior: Behavior normal.    ED Results / Procedures / Treatments   Labs (all labs ordered are listed, but only abnormal results are displayed) Labs Reviewed  BASIC METABOLIC PANEL - Abnormal; Notable for the following components:      Result Value   Glucose, Bld 161 (*)    Creatinine, Ser 1.08 (*)    GFR, Estimated 56 (*)    All other components within normal limits  RESP PANEL BY RT-PCR (FLU A&B,  COVID) ARPGX2  CBC WITH DIFFERENTIAL/PLATELET    EKG None  Radiology DG Chest Port 1 View  Result Date: 08/29/2021 CLINICAL DATA:  Pre-op. EXAM: PORTABLE CHEST 1 VIEW COMPARISON:  None. FINDINGS: The heart size and mediastinal contours are within  normal limits. Both lungs are clear. No pleural effusion or pneumothorax. The visualized skeletal structures are unremarkable. IMPRESSION: No active disease. Electronically Signed   By: Ileana Roup M.D.   On: 08/29/2021 12:43   DG Hip Unilat W or Wo Pelvis 2-3 Views Right  Result Date: 08/29/2021 CLINICAL DATA:  [place] EXAM: DG HIP (WITH OR WITHOUT PELVIS) 2-3V RIGHT COMPARISON:  07/09/2021 FINDINGS: Impacted right mid cervical femoral neck fracture. No dislocation. Bony pelvis intact. IMPRESSION: 1. Impacted mid cervical right femoral neck fracture. Electronically Signed   By: Lucrezia Europe M.D.   On: 08/29/2021 12:10    Procedures Procedures   Medications Ordered in ED Medications  fentaNYL (SUBLIMAZE) injection 50 mcg (50 mcg Intravenous Given 08/29/21 1302)  ondansetron (ZOFRAN) injection 4 mg (4 mg Intravenous Given 08/29/21 1302)    ED Course  I have reviewed the triage vital signs and the nursing notes.  Pertinent labs & imaging results that were available during my care of the patient were reviewed by me and considered in my medical decision making (see chart for details).   MDM Rules/Calculators/A&P                           Patient presenting for evaluation of right hip pain after fall.  On exam, patient peers nontoxic.  She is neurovascular intact.  Concern for hip fracture in that leg is shortened externally rotated.  X-ray viewed and independently interpreted by me, shows a right femoral neck fracture.  Clearance labs and chest x-ray ordered.  Will consult with orthopedics.  Discussed with Dr. Sammuel Hines from orthopedics who will evaluate the patient.  Patient may be getting surgery today, will keep n.p.o.  Labs interpreted by me, overall reassuring.  Will call for admission.  Discussed with Dr. Rogers Blocker from [facility], patient to be admitted  Final Clinical Impression(s) / ED Diagnoses Final diagnoses:  Closed fracture of right hip, initial encounter [facility])   Fall, initial encounter    Rx / DC Orders ED Discharge Orders     None        Franchot Heidelberg, PA-C 08/29/21 [place], DO 08/29/21 1426

## 2021-08-29 NOTE — ED Triage Notes (Signed)
Pt tripped and fell [place] this [place].  Denies pain while sitting in wheelchair but has R hip pain when standing/ambulating.

## 2021-08-29 NOTE — ED Notes (Signed)
Pt fell forward on the concrete outside, no LOC, did not hit head, no blood thinners, pt rolled on her side and when her family tried to get her up she could not bear weight on the right leg

## 2021-08-29 NOTE — Consult Note (Addendum)
ORTHOPAEDIC CONSULTATION  REQUESTING PHYSICIAN: Orland Mustard, MD  Chief Complaint: right hip pain  HPI: Bianca Phillips is a 65 y.o. female who presents with presents with displaced right femoral neck fracture after a fall from standing. Denies any other site of pain or injury. She lives at home alone. She is here today with her daughter Bianca Phillips. She is retired but continues to be active and enjoys dancing in her free time. She does have DM which is controlled on metformin. History of stable kidney disease.  Past Medical History:  Diagnosis Date   Complication of anesthesia    Constipation    GAD (generalized anxiety disorder)    Heart murmur    Heart palpitations    evaluated by cardiologist--- dr h. Shari Prows, note in epic 04-03-2021, normal echo and monitor showed NSR w/ SVT   History of COVID-19    per pt had covid twice in 12/ 2020 with mild symptoms with residual intermittant loss of taste;  and 04/ 2022 with mild symptoms that resolved   History of hyperthyroidism    age 35 s/p RAI   History of kidney stones    History of recurrent UTIs    History of repair of hiatal hernia 02/2020   Hyperlipidemia    Hypothyroidism, postablative    age 53  s/p RAI for hyperthyroidism;  followed by pcp   IDA (iron deficiency anemia)    MDD (major depressive disorder)    OA (osteoarthritis)    Right ureteral calculus    Seasonal allergic rhinitis    Type 2 diabetes mellitus (HCC)    followed by pcp    (07-14-2021  per pt checks blood sugar at home 2-3 times weekly,  fasting sugar --120-130)   Urgency of urination    Wears hearing aid in both ears    Past Surgical History:  Procedure Laterality Date   CESAREAN SECTION     x2  last one 1986   CYSTOSCOPY/URETEROSCOPY/HOLMIUM LASER/STENT PLACEMENT  2012   approx   CYSTOSCOPY/URETEROSCOPY/HOLMIUM LASER/STENT PLACEMENT Right 07/20/2021   Procedure: CYSTOSCOPY/RETROGRADE/URETEROSCOPY/HOLMIUM LASER/STENT PLACEMENT;  Surgeon: Jannifer Hick, MD;  Location: [facility];  Service: Urology;  Laterality: Right;  ONLY NEEDS 45 MIN   LAPAROSCOPIC NISSEN FUNDOPLICATION  02/2020   TOTAL ABDOMINAL HYSTERECTOMY W/ BILATERAL SALPINGOOPHORECTOMY  2002   Social History   Socioeconomic History   Marital status: Divorced    Spouse name: Not on file   Number of children: 3   Years of education: 16   Highest education level: Bachelor's degree (e.g., BA, AB, BS)  Occupational History   Occupation: retired    Comment: Runner, broadcasting/film/video  Tobacco Use   Smoking status: Former    Packs/day: 0.50    Years: 10.00    Pack years: 5.00    Types: Cigarettes    Quit date: 1988    Years since quitting: 34.9   Smokeless tobacco: Never  Vaping Use   Vaping Use: Never used  Substance and Sexual Activity   Alcohol use: Not Currently   Drug use: Never   Sexual activity: Not Currently    Birth control/protection: Post-menopausal, Surgical  Other Topics Concern   Not on file  Social History Narrative   Not on file   Social Determinants of Health   Financial Resource Strain: Not on file  Food Insecurity: Not on file  Transportation Needs: Not on file  Physical Activity: Not on file  Stress: Not on file  Social Connections: Not on file  No family history on file. - negative except otherwise stated in the family history section Allergies  Allergen Reactions   Curare [Tubocurarine] Other (See Comments)    Per pt was awake and aware that she had paralysis and could not breath   Prior to Admission medications   Medication Sig Start Date End Date Taking? Authorizing Provider  Alpha-Lipoic Acid 600 MG TABS Take 1 tablet by mouth every evening.   Yes [provider]  ALPRAZolam (XANAX) 0.25 MG tablet Take 1 tablet (0.25 mg total) by mouth at bedtime as needed for anxiety. Do not take with pain medication or other medication that causes drowsiness. 07/08/21  Yes Early, Sung Amabile, NP  Ascorbic Acid (VITAMIN C) 1000 MG tablet  Take 1,000 mg by mouth daily.   Yes [provider]  aspirin 81 MG chewable tablet Chew 81 mg by mouth daily.   Yes [provider]  atorvastatin (LIPITOR) 40 MG tablet Take 1 tablet (40 mg total) by mouth at bedtime. 03/16/21  Yes Early, Sung Amabile, NP  azithromycin (ZITHROMAX) 250 MG tablet Take 2 tablets (500 mg) by mouth on day 1. Then take 1 tablet (250 mg) by mouth on days 2-5. Patient taking differently: Take 250-500 mg by mouth daily. Take 2 tablets (500 mg) by mouth on day 1. Then take 1 tablet (250 mg) by mouth on days 2-5. 08/26/21  Yes Early, Sung Amabile, NP  cetirizine (ZYRTEC) 10 MG tablet Take 10 mg by mouth daily.   Yes [provider]  cholecalciferol (VITAMIN D3) 25 MCG (1000 UNIT) tablet Take 1,000 Units by mouth daily.   Yes [provider]  docusate sodium (COLACE) 100 MG capsule Take 200 mg by mouth daily.   Yes [provider]  fluticasone (FLONASE) 50 MCG/ACT nasal spray Place 1 spray into both nostrils daily as needed.   Yes [provider]  glucose blood (ACCU-CHEK AVIVA PLUS) test strip USE TO CHECK BLOOD SUGAR EVERY DAY 07/08/21  Yes Early, Sung Amabile, NP  levothyroxine (SYNTHROID) 150 MCG tablet Take 1 tablet (150 mcg total) by mouth daily before breakfast. Patient taking differently: Take 150 mcg by mouth daily before breakfast. 04/27/21  Yes Early, Sung Amabile, NP  meclizine (ANTIVERT) 12.5 MG tablet Take 12.5 mg by mouth every 6 (six) hours as needed for dizziness. 12/29/20  Yes [provider]  metFORMIN (GLUCOPHAGE) 500 MG tablet Take 1.5 tablets (750 mg total) by mouth daily with breakfast AND 1 tablet (500 mg total) every evening. 07/14/21  Yes Early, Sung Amabile, NP  Misc Natural Products (GLUCOSAMINE CHOND CMP ADVANCED PO) Take 2 capsules by mouth every evening.   Yes [provider]  nortriptyline (PAMELOR) 50 MG capsule TAKE 1 CAPSULE BY MOUTH EVERY NIGHT AT BEDTIME. DISCONTINUE AMITRIPTYLINE Patient taking  differently: Take 50 mg by mouth at bedtime. 04/27/21  Yes Early, Sung Amabile, NP  Omega-3 Fatty Acids (OMEGA-3 FISH OIL PO) Take 2,000 capsules by mouth every evening.   Yes [provider]  Probiotic Product (PROBIOTIC DAILY) CAPS Take 1 capsule by mouth every evening.   Yes [provider]  sertraline (ZOLOFT) 50 MG tablet Take 1 tablet (50 mg total) by mouth daily. 04/20/21  Yes Early, Sung Amabile, NP  trimethoprim (TRIMPEX) 100 MG tablet Take 100 mg by mouth at bedtime. 06/10/21  Yes [provider]  Turmeric 500 MG CAPS Take 1 capsule by mouth every evening.   Yes [provider]  metoprolol tartrate (LOPRESSOR) 25 MG  tablet Take 1 tablet (25 mg total) by mouth 2 (two) times daily as needed (palpitations). 08/03/21   Meriam Sprague, MD   DG Chest Port 1 View  Result Date: 08/29/2021 CLINICAL DATA:  Pre-op. EXAM: PORTABLE CHEST 1 VIEW COMPARISON:  None. FINDINGS: The heart size and mediastinal contours are within normal limits. Both lungs are clear. No pleural effusion or pneumothorax. The visualized skeletal structures are unremarkable. IMPRESSION: No active disease. Electronically Signed   By: Sherron Ales M.D.   On: 08/29/2021 12:43   DG Hip Unilat W or Wo Pelvis 2-3 Views Right  Result Date: 08/29/2021 CLINICAL DATA:  [place] EXAM: DG HIP (WITH OR WITHOUT PELVIS) 2-3V RIGHT COMPARISON:  07/09/2021 FINDINGS: Impacted right mid cervical femoral neck fracture. No dislocation. Bony pelvis intact. IMPRESSION: 1. Impacted mid cervical right femoral neck fracture. Electronically Signed   By: Corlis Leak M.D.   On: 08/29/2021 12:10     Positive ROS: All other systems have been reviewed and were otherwise negative with the exception of those mentioned in the HPI and as above.  Physical Exam: General: No acute distress Cardiovascular: No pedal edema Respiratory: No cyanosis, no use of accessory musculature GI: No organomegaly, abdomen is soft and non-tender Skin: No  lesions in the area of chief complaint Neurologic: Sensation intact distally Psychiatric: Patient is at baseline mood and affect Lymphatic: No axillary or cervical lymphadenopathy  MUSCULOSKELETAL:  Right hip is short and rotated. SILT right foot all distributions. 2+ DP pulse. Fires EHL/TA/GS  Independent Imaging Review: Xrays right hip 2 views: Displaced right femoral neck fracture  Assessment: 65 year old female otherwise active and a community ambulator with a displaced right femoral neck fracture after a fall from standing. Given her young age and activity level, at this time I would recommend a THA. I have spoken with Dr. Magnus Ivan regarding this who will help with this. I have requested his direct consultation on the patient given his expertise with total hip arthroplasty. We will plan for a R THA on 11/26 AM.  Plan: Plan for right hip THA on 11/26 AM, npo at MN please. Appreciate medical admission.   After a lengthy discussion of treatment options, including risks, benefits, alternatives, complications of surgical and nonsurgical conservative options, the patient elected surgical repair.   The patient  is aware of the material risks  and complications including, but not limited to injury to adjacent structures, neurovascular injury, infection, numbness, bleeding, implant failure, thermal burns, stiffness, persistent pain, failure to heal, disease transmission from allograft, need for further surgery, dislocation, anesthetic risks, blood clots, risks of death,and others. The probabilities of surgical success and failure discussed with patient given their particular co-morbidities.The time and nature of expected rehabilitation and recovery was discussed.The patient's questions were all answered preoperatively.  No barriers to understanding were noted. I explained the natural history of the disease process and Rx rationale.  I explained to the patient what I considered to be reasonable  expectations given their personal situation.  The final treatment plan was arrived at through a shared patient decision making process model.   Thank you for the consult and the opportunity to see Ms. Bianca Halsted, MD [facility] 3:25 PM

## 2021-08-29 NOTE — ED Provider Notes (Signed)
Emergency Medicine Provider Triage Evaluation Note  Catalaya Garr , a 65 y.o. female  was evaluated in triage.  Pt complains of fall.  Review of Systems  Positive: Fall, R hip pain Negative: Head injury, LOC, back pain, knee pain  Physical Exam  BP (!) 164/95 (BP Location: Left Arm)   Pulse (!) 104   Temp 98.2 F (36.8 C) (Oral)   Resp 17   SpO2 99%  Gen:   Awake, no distress   Resp:  Normal effort  MSK:   Moves extremities without difficulty  Other:    Medical Decision Making  Medically screening exam initiated at 11:29 AM.  Appropriate orders placed.  Graylee Arutyunyan was informed that the remainder of the evaluation will be completed by another provider, this initial triage assessment does not replace that evaluation, and the importance of remaining in the ED until their evaluation is complete.  Pt tripped on uneven ground and fell, landed on R hip.  Did not hit head or LOC.  R hip pain, unable to ambulate.  Pain minimal at rest.    Fayrene Helper, PA-C 08/29/21 1131    Renaye Rakers Kermit Balo, MD 08/29/21 1218

## 2021-08-30 ENCOUNTER — Encounter (HOSPITAL_COMMUNITY): Payer: Self-pay | Admitting: Family Medicine

## 2021-08-30 ENCOUNTER — Inpatient Hospital Stay (HOSPITAL_COMMUNITY): Payer: Medicare Other | Admitting: Certified Registered Nurse Anesthetist

## 2021-08-30 ENCOUNTER — Inpatient Hospital Stay (HOSPITAL_COMMUNITY): Payer: Medicare Other

## 2021-08-30 ENCOUNTER — Encounter (HOSPITAL_COMMUNITY)
Admission: EM | Disposition: A | Discharge status: Home Health | Payer: Self-pay | Source: Home / Self Care | Attending: Internal Medicine

## 2021-08-30 DIAGNOSIS — S72001D Fracture of unspecified part of neck of right femur, subsequent encounter for closed fracture with routine healing: Secondary | ICD-10-CM

## 2021-08-30 DIAGNOSIS — S72001A Fracture of unspecified part of neck of right femur, initial encounter for closed fracture: Secondary | ICD-10-CM

## 2021-08-30 HISTORY — PX: TOTAL HIP ARTHROPLASTY: SHX124

## 2021-08-30 LAB — BASIC METABOLIC PANEL
Anion gap: 9 (ref 5–15)
BUN: 18 mg/dL (ref 8–23)
CO2: 26 mmol/L (ref 22–32)
Calcium: 9 mg/dL (ref 8.9–10.3)
Chloride: 100 mmol/L (ref 98–111)
Creatinine, Ser: 1 mg/dL (ref 0.44–1.00)
GFR, Estimated: >60 mL/min (ref >60)
Glucose, Bld: 188 mg/dL — ABNORMAL HIGH (ref 70–99)
Potassium: 3.9 mmol/L (ref 3.5–5.1)
Sodium: 135 mmol/L (ref 135–145)

## 2021-08-30 LAB — CBC
HCT: 37.5 % (ref 36.0–46.0)
Hemoglobin: 12.3 g/dL (ref 12.0–15.0)
MCH: 29.1 pg (ref 26.0–34.0)
MCHC: 32.8 g/dL (ref 30.0–36.0)
MCV: 88.7 fL (ref 80.0–100.0)
Platelets: 222 10*3/uL (ref 150–400)
RBC: 4.23 MIL/uL (ref 3.87–5.11)
RDW: 12.7 % (ref 11.5–15.5)
WBC: 8.8 10*3/uL (ref 4.0–10.5)
nRBC: 0 % (ref 0.0–0.2)

## 2021-08-30 LAB — GLUCOSE, CAPILLARY
Glucose-Capillary: 181 mg/dL — ABNORMAL HIGH (ref 70–99)
Glucose-Capillary: 125 mg/dL — ABNORMAL HIGH (ref 70–99)
Glucose-Capillary: 125 mg/dL — ABNORMAL HIGH (ref 70–99)
Glucose-Capillary: 244 mg/dL — ABNORMAL HIGH (ref 70–99)
Glucose-Capillary: 183 mg/dL — ABNORMAL HIGH (ref 70–99)
Glucose-Capillary: 218 mg/dL — ABNORMAL HIGH (ref 70–99)

## 2021-08-30 LAB — HIV ANTIBODY (ROUTINE TESTING W REFLEX): HIV Screen 4th Generation wRfx: NONREACTIVE

## 2021-08-30 LAB — SURGICAL PCR SCREEN
MRSA, PCR: NEGATIVE
Staphylococcus aureus: NEGATIVE

## 2021-08-30 SURGERY — ARTHROPLASTY, HIP, TOTAL, ANTERIOR APPROACH
Anesthesia: Monitor Anesthesia Care | Site: Hip | Laterality: Right

## 2021-08-30 MED ORDER — MORPHINE SULFATE (PF) 2 MG/ML IV SOLN: 1.0000 mg | INTRAVENOUS | Status: DC | PRN

## 2021-08-30 MED ORDER — METHOCARBAMOL 1000 MG/10ML IJ SOLN
500.0000 mg | Freq: Four times a day (QID) | INTRAVENOUS | Status: DC | PRN
  Filled 2021-08-30: qty 5

## 2021-08-30 MED ORDER — ONDANSETRON HCL 4 MG/2ML IJ SOLN: 4.0000 mg | Freq: Four times a day (QID) | INTRAMUSCULAR | Status: DC | PRN

## 2021-08-30 MED ORDER — ASPIRIN 81 MG PO CHEW
81.0000 mg | CHEWABLE_TABLET | Freq: Two times a day (BID) | ORAL | Status: DC
  Administered 2021-08-30 – 2021-09-02 (×5): 81 mg via ORAL
  Filled 2021-08-30 (×5): qty 1

## 2021-08-30 MED ORDER — MENTHOL 3 MG MT LOZG: 1.0000 | LOZENGE | OROMUCOSAL | Status: DC | PRN

## 2021-08-30 MED ORDER — PROPOFOL 10 MG/ML IV BOLUS
INTRAVENOUS | Status: DC | PRN
  Administered 2021-08-30 (×3): 20 mg via INTRAVENOUS

## 2021-08-30 MED ORDER — ALBUMIN HUMAN 5 % IV SOLN: INTRAVENOUS | Status: DC | PRN

## 2021-08-30 MED ORDER — ORAL CARE MOUTH RINSE: 15.0000 mL | Freq: Once | OROMUCOSAL | Status: AC

## 2021-08-30 MED ORDER — MIDAZOLAM HCL 2 MG/2ML IJ SOLN
INTRAMUSCULAR | Status: AC
  Filled 2021-08-30: qty 2

## 2021-08-30 MED ORDER — PANTOPRAZOLE SODIUM 40 MG PO TBEC
40.0000 mg | DELAYED_RELEASE_TABLET | Freq: Every day | ORAL | Status: DC
  Administered 2021-08-30 – 2021-09-02 (×4): 40 mg via ORAL
  Filled 2021-08-30 (×4): qty 1

## 2021-08-30 MED ORDER — BUPIVACAINE IN DEXTROSE 0.75-8.25 % IT SOLN
INTRATHECAL | Status: DC | PRN
  Administered 2021-08-30: 1.6 mL via INTRATHECAL

## 2021-08-30 MED ORDER — CHLORHEXIDINE GLUCONATE CLOTH 2 % EX PADS
6.0000 | MEDICATED_PAD | Freq: Every day | CUTANEOUS | Status: DC
  Administered 2021-08-30 – 2021-09-02 (×3): 6 via TOPICAL

## 2021-08-30 MED ORDER — MORPHINE SULFATE (PF) 2 MG/ML IV SOLN
1.0000 mg | INTRAVENOUS | Status: DC | PRN
  Administered 2021-08-30 – 2021-08-31 (×3): 1 mg via INTRAVENOUS
  Filled 2021-08-30 (×3): qty 1

## 2021-08-30 MED ORDER — 0.9 % SODIUM CHLORIDE (POUR BTL) OPTIME
TOPICAL | Status: DC | PRN
  Administered 2021-08-30: 08:00:00 1000 mL

## 2021-08-30 MED ORDER — OXYCODONE HCL 5 MG PO TABS: 10.0000 mg | ORAL_TABLET | ORAL | Status: DC | PRN

## 2021-08-30 MED ORDER — HYDROMORPHONE HCL 1 MG/ML IJ SOLN
0.5000 mg | INTRAMUSCULAR | Status: AC | PRN
  Administered 2021-08-31 – 2021-09-01 (×2): 1 mg via INTRAVENOUS
  Filled 2021-08-30 (×2): qty 1

## 2021-08-30 MED ORDER — CEFAZOLIN SODIUM-DEXTROSE 2-4 GM/100ML-% IV SOLN
2.0000 g | INTRAVENOUS | Status: AC
  Administered 2021-08-30: 08:00:00 2 g via INTRAVENOUS
  Filled 2021-08-30: qty 100

## 2021-08-30 MED ORDER — SODIUM CHLORIDE 0.9 % IV SOLN: INTRAVENOUS | Status: DC

## 2021-08-30 MED ORDER — OXYCODONE HCL 5 MG PO TABS
5.0000 mg | ORAL_TABLET | ORAL | Status: DC | PRN
  Administered 2021-08-30: 11:00:00 5 mg via ORAL

## 2021-08-30 MED ORDER — FENTANYL CITRATE (PF) 100 MCG/2ML IJ SOLN: 25.0000 ug | INTRAMUSCULAR | Status: DC | PRN

## 2021-08-30 MED ORDER — CEFAZOLIN SODIUM-DEXTROSE 1-4 GM/50ML-% IV SOLN
1.0000 g | Freq: Four times a day (QID) | INTRAVENOUS | Status: AC
  Administered 2021-08-30 (×2): 1 g via INTRAVENOUS
  Filled 2021-08-30 (×2): qty 50

## 2021-08-30 MED ORDER — CHLORHEXIDINE GLUCONATE 0.12 % MT SOLN: 15.0000 mL | Freq: Once | OROMUCOSAL | Status: AC

## 2021-08-30 MED ORDER — METHOCARBAMOL 500 MG PO TABS
500.0000 mg | ORAL_TABLET | Freq: Four times a day (QID) | ORAL | Status: DC | PRN
  Administered 2021-08-30 – 2021-08-31 (×3): 500 mg via ORAL
  Filled 2021-08-30 (×3): qty 1

## 2021-08-30 MED ORDER — ONDANSETRON HCL 4 MG PO TABS: 4.0000 mg | ORAL_TABLET | Freq: Four times a day (QID) | ORAL | Status: DC | PRN

## 2021-08-30 MED ORDER — ONDANSETRON HCL 4 MG/2ML IJ SOLN
INTRAMUSCULAR | Status: DC | PRN
  Administered 2021-08-30: 4 mg via INTRAVENOUS

## 2021-08-30 MED ORDER — POLYETHYLENE GLYCOL 3350 17 G PO PACK: 17.0000 g | PACK | Freq: Every day | ORAL | Status: DC | PRN

## 2021-08-30 MED ORDER — SODIUM CHLORIDE 0.9 % IR SOLN
Status: DC | PRN
  Administered 2021-08-30: 1000 mL

## 2021-08-30 MED ORDER — FENTANYL CITRATE (PF) 250 MCG/5ML IJ SOLN
INTRAMUSCULAR | Status: AC
  Filled 2021-08-30: qty 5

## 2021-08-30 MED ORDER — METOCLOPRAMIDE HCL 5 MG/ML IJ SOLN: 5.0000 mg | Freq: Three times a day (TID) | INTRAMUSCULAR | Status: DC | PRN

## 2021-08-30 MED ORDER — CHLORHEXIDINE GLUCONATE 4 % EX LIQD
60.0000 mL | Freq: Once | CUTANEOUS | Status: AC
  Administered 2021-08-30: 4 via TOPICAL
  Filled 2021-08-30: qty 15

## 2021-08-30 MED ORDER — ALUM & MAG HYDROXIDE-SIMETH 200-200-20 MG/5ML PO SUSP: 30.0000 mL | ORAL | Status: DC | PRN

## 2021-08-30 MED ORDER — MIDAZOLAM HCL 2 MG/2ML IJ SOLN
INTRAMUSCULAR | Status: DC | PRN
  Administered 2021-08-30: 2 mg via INTRAVENOUS

## 2021-08-30 MED ORDER — FENTANYL CITRATE (PF) 250 MCG/5ML IJ SOLN
INTRAMUSCULAR | Status: DC | PRN
  Administered 2021-08-30: 50 ug via INTRAVENOUS

## 2021-08-30 MED ORDER — HYDROMORPHONE HCL 1 MG/ML IJ SOLN: 0.5000 mg | INTRAMUSCULAR | Status: DC | PRN

## 2021-08-30 MED ORDER — PHENOL 1.4 % MT LIQD: 1.0000 | OROMUCOSAL | Status: DC | PRN

## 2021-08-30 MED ORDER — PROPOFOL 10 MG/ML IV BOLUS
INTRAVENOUS | Status: AC
  Filled 2021-08-30: qty 20

## 2021-08-30 MED ORDER — PHENYLEPHRINE HCL (PRESSORS) 10 MG/ML IV SOLN
INTRAVENOUS | Status: DC | PRN
  Administered 2021-08-30: 80 ug via INTRAVENOUS

## 2021-08-30 MED ORDER — DIPHENHYDRAMINE HCL 12.5 MG/5ML PO ELIX: 12.5000 mg | ORAL_SOLUTION | ORAL | Status: DC | PRN

## 2021-08-30 MED ORDER — METOCLOPRAMIDE HCL 5 MG PO TABS: 5.0000 mg | ORAL_TABLET | Freq: Three times a day (TID) | ORAL | Status: DC | PRN

## 2021-08-30 MED ORDER — TRANEXAMIC ACID-NACL 1000-0.7 MG/100ML-% IV SOLN
1000.0000 mg | INTRAVENOUS | Status: AC
  Administered 2021-08-30: 08:00:00 1000 mg via INTRAVENOUS
  Filled 2021-08-30: qty 100

## 2021-08-30 MED ORDER — DOCUSATE SODIUM 100 MG PO CAPS: 100.0000 mg | ORAL_CAPSULE | Freq: Two times a day (BID) | ORAL | Status: DC

## 2021-08-30 MED ORDER — PHENYLEPHRINE HCL-NACL 20-0.9 MG/250ML-% IV SOLN
INTRAVENOUS | Status: DC | PRN
  Administered 2021-08-30: 40 ug/min via INTRAVENOUS

## 2021-08-30 MED ORDER — PROPOFOL 500 MG/50ML IV EMUL
INTRAVENOUS | Status: DC | PRN
  Administered 2021-08-30: 50 ug/kg/min via INTRAVENOUS

## 2021-08-30 MED ORDER — OXYCODONE HCL 5 MG PO TABS
ORAL_TABLET | ORAL | Status: AC
  Filled 2021-08-30: qty 1

## 2021-08-30 MED ORDER — CHLORHEXIDINE GLUCONATE 0.12 % MT SOLN
OROMUCOSAL | Status: AC
  Administered 2021-08-30: 07:00:00 15 mL via OROMUCOSAL
  Filled 2021-08-30: qty 15

## 2021-08-30 MED ORDER — ACETAMINOPHEN 325 MG PO TABS: 325.0000 mg | ORAL_TABLET | Freq: Four times a day (QID) | ORAL | Status: DC | PRN

## 2021-08-30 MED ORDER — LACTATED RINGERS IV SOLN: INTRAVENOUS | Status: DC

## 2021-08-30 SURGICAL SUPPLY — 56 items
BAG COUNTER SPONGE SURGICOUNT (BAG) ×2 IMPLANT
BENZOIN TINCTURE PRP APPL 2/3 (GAUZE/BANDAGES/DRESSINGS) ×2 IMPLANT
BLADE CLIPPER SURG (BLADE) IMPLANT
BLADE SAW SGTL 18X1.27X75 (BLADE) ×2 IMPLANT
CLOSURE STERI-STRIP 1/4X4 (GAUZE/BANDAGES/DRESSINGS) ×2 IMPLANT
COVER SURGICAL LIGHT HANDLE (MISCELLANEOUS) ×2 IMPLANT
DRAPE C-ARM 42X72 X-RAY (DRAPES) ×2 IMPLANT
DRAPE STERI IOBAN 125X83 (DRAPES) ×2 IMPLANT
DRAPE U-SHAPE 47X51 STRL (DRAPES) ×6 IMPLANT
DRSG AQUACEL AG ADV 3.5X10 (GAUZE/BANDAGES/DRESSINGS) ×2 IMPLANT
DURAPREP 26ML APPLICATOR (WOUND CARE) ×2 IMPLANT
ELECT BLADE 4.0 EZ CLEAN MEGAD (MISCELLANEOUS) ×2
ELECT BLADE 6.5 EXT (BLADE) ×2 IMPLANT
ELECT REM PT RETURN 9FT ADLT (ELECTROSURGICAL) ×2
ELECTRODE BLDE 4.0 EZ CLN MEGD (MISCELLANEOUS) ×1 IMPLANT
ELECTRODE REM PT RTRN 9FT ADLT (ELECTROSURGICAL) ×1 IMPLANT
FACESHIELD WRAPAROUND (MASK) ×2 IMPLANT
FEM STEM 12/14 TAPER SZ 4 HIP (Orthopedic Implant) ×2 IMPLANT
FEMORAL STEM 12/14 TPR SZ4 HIP (Orthopedic Implant) ×1 IMPLANT
GAUZE XEROFORM 5X9 LF (GAUZE/BANDAGES/DRESSINGS) ×2 IMPLANT
GLOVE SRG 8 PF TXTR STRL LF DI (GLOVE) ×2 IMPLANT
GLOVE SURG LTX SZ8 (GLOVE) ×2 IMPLANT
GLOVE SURG ORTHO LTX SZ7.5 (GLOVE) ×4 IMPLANT
GLOVE SURG UNDER POLY LF SZ8 (GLOVE) ×2
GOWN STRL REUS W/ TWL LRG LVL3 (GOWN DISPOSABLE) ×2 IMPLANT
GOWN STRL REUS W/ TWL XL LVL3 (GOWN DISPOSABLE) ×3 IMPLANT
GOWN STRL REUS W/TWL LRG LVL3 (GOWN DISPOSABLE) ×2
GOWN STRL REUS W/TWL XL LVL3 (GOWN DISPOSABLE) ×3
HANDPIECE INTERPULSE COAX TIP (DISPOSABLE) ×1
HEAD CERAMIC 36 PLUS5 (Hips) ×2 IMPLANT
KIT BASIN OR (CUSTOM PROCEDURE TRAY) ×2 IMPLANT
KIT TURNOVER KIT B (KITS) ×2 IMPLANT
LINER ACETAB NEUTRAL 36ID 520D (Liner) ×2 IMPLANT
MANIFOLD NEPTUNE II (INSTRUMENTS) ×2 IMPLANT
NS IRRIG 1000ML POUR BTL (IV SOLUTION) ×2 IMPLANT
PACK TOTAL JOINT (CUSTOM PROCEDURE TRAY) ×2 IMPLANT
PAD ARMBOARD 7.5X6 YLW CONV (MISCELLANEOUS) ×2 IMPLANT
PIN SECTOR W/GRIP ACE CUP 52MM (Hips) ×2 IMPLANT
SET HNDPC FAN SPRY TIP SCT (DISPOSABLE) ×1 IMPLANT
STAPLER VISISTAT 35W (STAPLE) ×2 IMPLANT
STRIP CLOSURE SKIN 1/2X4 (GAUZE/BANDAGES/DRESSINGS) ×4 IMPLANT
SUT ETHIBOND NAB CT1 #1 30IN (SUTURE) ×4 IMPLANT
SUT MNCRL AB 4-0 PS2 18 (SUTURE) IMPLANT
SUT VIC AB 0 CT1 27 (SUTURE) ×1
SUT VIC AB 0 CT1 27XBRD ANBCTR (SUTURE) ×1 IMPLANT
SUT VIC AB 1 CT1 27 (SUTURE) ×1
SUT VIC AB 1 CT1 27XBRD ANBCTR (SUTURE) ×1 IMPLANT
SUT VIC AB 2-0 CT1 27 (SUTURE) ×1
SUT VIC AB 2-0 CT1 TAPERPNT 27 (SUTURE) ×1 IMPLANT
TOWEL GREEN STERILE (TOWEL DISPOSABLE) ×2 IMPLANT
TOWEL GREEN STERILE FF (TOWEL DISPOSABLE) ×2 IMPLANT
TRAY CATH 16FR W/PLASTIC CATH (SET/KITS/TRAYS/PACK) IMPLANT
TRAY FOLEY W/BAG SLVR 16FR (SET/KITS/TRAYS/PACK) ×1
TRAY FOLEY W/BAG SLVR 16FR ST (SET/KITS/TRAYS/PACK) ×1 IMPLANT
WATER STERILE IRR 1000ML POUR (IV SOLUTION) ×4 IMPLANT
YANKAUER SUCT BULB TIP NO VENT (SUCTIONS) ×2 IMPLANT

## 2021-08-30 NOTE — Transfer of Care (Signed)
Immediate Anesthesia Transfer of Care Note  Patient: Cj Beecher  Procedure(s) Performed: TOTAL HIP ARTHROPLASTY ANTERIOR APPROACH (Right: Hip)  Patient Location: PACU  Anesthesia Type:MAC and Spinal  Level of Consciousness: awake, alert  and oriented  Airway & Oxygen Therapy: Patient Spontanous Breathing  Post-op Assessment: Report given to RN and Post -op Vital signs reviewed and stable  Post vital signs: Reviewed and stable  Last Vitals:  Vitals Value Taken Time  BP 150/117 08/30/21 0941  Temp    Pulse 97 08/30/21 0944  Resp 18 08/30/21 0944  SpO2 95 % 08/30/21 0944  Vitals shown include unvalidated device data.  Last Pain:  Vitals:   08/30/21 0704  TempSrc: Oral  PainSc:          Complications: No notable events documented.

## 2021-08-30 NOTE — Progress Notes (Signed)
Pt transported to surgery

## 2021-08-30 NOTE — Brief Op Note (Signed)
08/30/2021  9:34 AM  PATIENT:  Bianca Phillips  65 y.o. female  PRE-OPERATIVE DIAGNOSIS:  Right Hip Fx  POST-OPERATIVE DIAGNOSIS:  Right Hip Fx  PROCEDURE:  Procedure(s): TOTAL HIP ARTHROPLASTY ANTERIOR APPROACH (Right)  SURGEON:  Surgeon(s) and Role:    Kathryne Hitch, MD - Primary    Huel Cote, MD - Assisting  ANESTHESIA:   spinal  EBL:  250 mL   COUNTS:  YES  DICTATION: .Other Dictation: Dictation Number [id number]  PLAN OF CARE: Admit to inpatient   PATIENT DISPOSITION:  PACU - hemodynamically stable.   Delay start of Pharmacological VTE agent (>24hrs) due to surgical blood loss or risk of bleeding: no

## 2021-08-30 NOTE — Progress Notes (Signed)
Report given to OR.

## 2021-08-30 NOTE — Progress Notes (Signed)
Pt has arrived to [facility] from ED. Pt daughter at the bedside.  Pt alert and oriented x 4, identified appropriately, Vs stable, no signs of acute distress, denied chest pain, SOB. Pt and family advised about belongings policy, no meds at the bedside.  Pt oriented to room and equipment, instructed to call for assistance, and call bell left within pt reach. Will continue to monitor pt and treat per MD orders.

## 2021-08-30 NOTE — Progress Notes (Signed)
Patient ID: Bianca Phillips, female   DOB: [birth year], 65 y.o.   MRN: [id number] The patient understands that she is here today for a right total hip replacement to treat her right hip acute displaced femoral neck fracture.  I explained in detail while this is the best treatment option given her young age of 42 and the fact that she is highly mobile.  She is a diabetic but under good control.  I did discuss the risks and benefits of surgery.  We talked about the interoperative and postoperative course and what to expect in the long run.  All questions and concerns were answered and addressed.  Her vital signs and labs are stable.  The right hip has been marked and informed consent is obtained.

## 2021-08-30 NOTE — Anesthesia Postprocedure Evaluation (Signed)
Anesthesia Post Note  Patient: Bianca Phillips  Procedure(s) Performed: TOTAL HIP ARTHROPLASTY ANTERIOR APPROACH (Right: Hip)     Patient location during evaluation: PACU Anesthesia Type: MAC and Spinal Level of consciousness: awake and alert Pain management: pain level controlled Vital Signs Assessment: post-procedure vital signs reviewed and stable Respiratory status: spontaneous breathing, nonlabored ventilation, respiratory function stable and patient connected to nasal cannula oxygen Cardiovascular status: stable and blood pressure returned to baseline Postop Assessment: no apparent nausea or vomiting Anesthetic complications: no   No notable events documented.  Last Vitals:  Vitals:   08/30/21 1052 08/30/21 1419  BP: (!) 111/95 138/71  Pulse: 91 (!) 108  Resp: 15 16  Temp: 36.7 C 36.8 C  SpO2: 92% 98%    Last Pain:  Vitals:   08/30/21 1419  TempSrc: Oral  PainSc:                  March Rummage Anahit Klumb

## 2021-08-30 NOTE — Progress Notes (Signed)
PT Cancellation Note  Patient Details Name: Bianca Phillips MRN: [id number] DOB: August 18, 1956   Cancelled Treatment:    Reason Eval/Treat Not Completed: Patient declined, no reason specified still very sleepy from anesthetics post-op but willing to try; we talked about PLOF/home set up and then she changed her mind/asked PT to return later due to still feeling very sleepy after surgery. Will attempt to check back later if time/schedule allow.   Madelaine Etienne, DPT, PN2   Supplemental Physical Therapist [facility]    Pager [phone] Acute Rehab Office [phone]

## 2021-08-30 NOTE — Anesthesia Preprocedure Evaluation (Signed)
Anesthesia Evaluation    Airway Mallampati: II  TM Distance: >3 FB     Dental no notable dental hx.    Pulmonary neg pulmonary ROS, former smoker,    Pulmonary exam normal        Cardiovascular hypertension, Pt. on medications and Pt. on home beta blockers  Rhythm:Regular Rate:Normal     Neuro/Psych  Headaches, Anxiety Depression    GI/Hepatic negative GI ROS, Neg liver ROS,   Endo/Other  diabetes, Type 2, Oral Hypoglycemic AgentsHypothyroidism   Renal/GU   negative genitourinary   Musculoskeletal  (+) Arthritis , Osteoarthritis,  Right hip fx   Abdominal Normal abdominal exam  (+)   Peds  Hematology  (+) anemia ,   Anesthesia Other Findings   Reproductive/Obstetrics                             Anesthesia Physical Anesthesia Plan  ASA: 2  Anesthesia Plan: Spinal and MAC   Post-op Pain Management:    Induction: Intravenous  PONV Risk Score and Plan: 2 and Ondansetron, Dexamethasone, Propofol infusion and Treatment may vary due to age or medical condition  Airway Management Planned: Simple Face Mask, Natural Airway and Nasal Cannula  Additional Equipment: None  Intra-op Plan:   Post-operative Plan:   Informed Consent: I have reviewed the patients History and Physical, chart, labs and discussed the procedure including the risks, benefits and alternatives for the proposed anesthesia with the patient or authorized representative who has indicated his/her understanding and acceptance.     Dental advisory given  Plan Discussed with: CRNA  Anesthesia Plan Comments: (Lab Results      Component                Value               Date                      WBC                      8.8                 08/30/2021                HGB                      12.3                08/30/2021                HCT                      37.5                08/30/2021                MCV                       88.7                08/30/2021                PLT                      222  08/30/2021           Lab Results      Component                Value               Date                      NA                       135                 08/30/2021                K                        3.9                 08/30/2021                CO2                      26                  08/30/2021                GLUCOSE                  188 (H)             08/30/2021                BUN                      18                  08/30/2021                CREATININE               1.00                08/30/2021                CALCIUM                  9.0                 08/30/2021                EGFR                     53 (L)              03/16/2021                GFRNONAA                 >60                 08/30/2021          )        Anesthesia Quick Evaluation

## 2021-08-30 NOTE — Anesthesia Procedure Notes (Signed)
Spinal  Patient location during procedure: OR Start time: 08/30/2021 7:44 AM End time: 08/30/2021 7:46 AM Staffing Performed: anesthesiologist  Anesthesiologist: Atilano Median, DO Preanesthetic Checklist Completed: patient identified, IV checked, site marked, risks and benefits discussed, surgical consent, monitors and equipment checked, pre-op evaluation and timeout performed Spinal Block Patient position: left lateral decubitus Prep: DuraPrep Patient monitoring: heart rate, cardiac monitor, continuous pulse ox and blood pressure Approach: midline Location: L3-4 Injection technique: single-shot Needle Needle type: Pencan  Needle gauge: 24 G Needle length: 10 cm Assessment Events: CSF return Additional Notes Patient identified. Risks/Benefits/Options discussed with patient including but not limited to bleeding, infection, nerve damage, paralysis, failed block, incomplete pain control, headache, blood pressure changes, nausea, vomiting, reactions to medications, itching and postpartum back pain. Confirmed with bedside nurse the patient's most recent platelet count. Confirmed with patient that they are not currently taking any anticoagulation, have any bleeding history or any family history of bleeding disorders. Patient expressed understanding and wished to proceed. All questions were answered. Sterile technique was used throughout the entire procedure. Please see nursing notes for vital signs. Warning signs of high block given to the patient including shortness of breath, tingling/numbness in hands, complete motor block, or any concerning symptoms with instructions to call for help. Patient was given instructions on fall risk and not to get out of bed. All questions and concerns addressed with instructions to call with any issues or inadequate analgesia.

## 2021-08-30 NOTE — Progress Notes (Signed)
PROGRESS NOTE    Bianca Phillips  OZD:[id number] DOB: 01-18-1956 DOA: 08/29/2021 PCP: Tollie Eth, NP  Brief Narrative: 65/F with history of type 2 diabetes mellitus, CKD 3, hypothyroidism, dyslipidemia, palpitations, anxiety presented to the ED following a mechanical fall with right hip pain In the emergency room she was noted to have an impacted right femoral neck fracture  Assessment & Plan:    Closed right hip fracture (HCC)  -Orthopedics consulted, just underwent total right hip arthroplasty this morning -Start DVT prophylaxis tomorrow -Pain control -PT eval tomorrow -Labs in a.m.  CKD 3a -Creatinine stable  Type 2 diabetes mellitus -Controlled, hemoglobin A1c was 6.6 in May -Metformin on hold, sliding scale insulin  Dyslipidemia -Continue Lipitor  History of SVT -On as needed metoprolol  Hypothyroidism -Continue Synthroid  History of recurrent UTIs -No symptoms at this time, monitor  DVT prophylaxis: SCDs Code Status: Full code Family Communication: Patient seen in PACU, no family at bedside Disposition Plan:  Status is: Inpatient  Remains inpatient appropriate because: Severity of illness  Consultants:  Orthopedics  Procedures: Right total hip arthroplasty Dr. Magnus Ivan 11/27 Antimicrobials:    Subjective: -Pt seen and examined, back from surgery couple of hours ago, has some discomfort at surgical site  Objective: Vitals:   08/30/21 0942 08/30/21 0945 08/30/21 1000 08/30/21 1015  BP: (!) 150/117   (!) 111/55  Pulse: 94 96 85 90  Resp: 19 20 15  (!) 27  Temp: 97.7 F (36.5 C)     TempSrc:      SpO2: 93% 95% 92% 91%  Weight:      Height:        Intake/Output Summary (Last 24 hours) at 08/30/2021 1018 Last data filed at 08/30/2021 0918 Gross per 24 hour  Intake 1250 ml  Output 551 ml  Net 699 ml   Filed Weights   08/29/21 1211 08/30/21 0402  Weight: 66.7 kg 69.9 kg    Examination:   Gen: Awake, Alert, Oriented X 3,  HEENT: no  JVD Lungs: Good air movement bilaterally, CTAB CVS: S1S2/RRR Abd: soft, Non tender, non distended, BS present Extremities: Right hip wound with dressing no edema Skin: no new rashes on exposed skin     Data Reviewed:   CBC: Recent Labs  Lab 08/29/21 1213 08/30/21 0445  WBC 6.5 8.8  NEUTROABS 4.3  --   HGB 13.3 12.3  HCT 40.7 37.5  MCV 89.6 88.7  PLT 201 222   Basic Metabolic Panel: Recent Labs  Lab 08/29/21 1213 08/30/21 0445  NA 138 135  K 3.9 3.9  CL 105 100  CO2 25 26  GLUCOSE 161* 188*  BUN 19 18  CREATININE 1.08* 1.00  CALCIUM 9.1 9.0   GFR: Estimated Creatinine Clearance: 53.6 mL/min (by C-G formula based on SCr of 1 mg/dL). Liver Function Tests: No results for input(s): AST, ALT, ALKPHOS, BILITOT, PROT, ALBUMIN in the last 168 hours. No results for input(s): LIPASE, AMYLASE in the last 168 hours. No results for input(s): AMMONIA in the last 168 hours. Coagulation Profile: No results for input(s): INR, PROTIME in the last 168 hours. Cardiac Enzymes: No results for input(s): CKTOTAL, CKMB, CKMBINDEX, TROPONINI in the last 168 hours. BNP (last 3 results) No results for input(s): PROBNP in the last 8760 hours. HbA1C: No results for input(s): HGBA1C in the last 72 hours. CBG: Recent Labs  Lab 08/29/21 1659 08/30/21 0615 08/30/21 0942  GLUCAP 148* 181* 125*   Lipid Profile: No results for input(s): CHOL,  HDL, LDLCALC, TRIG, CHOLHDL, LDLDIRECT in the last 72 hours. Thyroid Function Tests: No results for input(s): TSH, T4TOTAL, FREET4, T3FREE, THYROIDAB in the last 72 hours. Anemia Panel: No results for input(s): VITAMINB12, FOLATE, FERRITIN, TIBC, IRON, RETICCTPCT in the last 72 hours. Urine analysis:    Component Value Date/Time   BILIRUBINUR Negative 06/24/2021 1619   KETONESUR negative 02/05/2021 1459   PROTEINUR Negative 06/24/2021 1619   UROBILINOGEN 0.2 06/24/2021 1619   NITRITE Negative 06/24/2021 1619   LEUKOCYTESUR Small (1+) (A)  06/24/2021 1619   Sepsis Labs: @LABRCNTIP (procalcitonin:4,lacticidven:4)  ) Recent Results (from the past 240 hour(s))  Resp Panel by RT-PCR (Flu A&B, Covid) Nasopharyngeal Swab     Status: None   Collection Time: 08/29/21 12:12 PM   Specimen: Nasopharyngeal Swab; Nasopharyngeal(NP) swabs in vial transport medium  Result Value Ref Range Status   SARS Coronavirus 2 by RT PCR NEGATIVE NEGATIVE Final    Comment: (NOTE) SARS-CoV-2 target nucleic acids are NOT DETECTED.  The SARS-CoV-2 RNA is generally detectable in upper respiratory specimens during the acute phase of infection. The lowest concentration of SARS-CoV-2 viral copies this assay can detect is 138 copies/mL. A negative result does not preclude SARS-Cov-2 infection and should not be used as the sole basis for treatment or other patient management decisions. A negative result may occur with  improper specimen collection/handling, submission of specimen other than nasopharyngeal swab, presence of viral mutation(s) within the areas targeted by this assay, and inadequate number of viral copies(<138 copies/mL). A negative result must be combined with clinical observations, patient history, and epidemiological information. The expected result is Negative.  Fact Sheet for Patients:  08/31/21  Fact Sheet for Healthcare Providers:  BloggerCourse.com  This test is no t yet approved or cleared by the [employer].[place] FDA and  has been authorized for detection and/or diagnosis of SARS-CoV-2 by FDA under an Emergency Use Authorization (EUA). This EUA will remain  in effect (meaning this test can be used) for the duration of the COVID-19 declaration under Section 564(b)(1) of the Act, 21 U.S.C.section 360bbb-3(b)(1), unless the authorization is terminated  or revoked sooner.       Influenza A by PCR NEGATIVE NEGATIVE Final   Influenza B by PCR NEGATIVE NEGATIVE Final     Comment: (NOTE) The Xpert Xpress SARS-CoV-2/FLU/RSV plus assay is intended as an aid in the diagnosis of influenza from Nasopharyngeal swab specimens and should not be used as a sole basis for treatment. Nasal washings and aspirates are unacceptable for Xpert Xpress SARS-CoV-2/FLU/RSV testing.  Fact Sheet for Patients: [place]  Fact Sheet for Healthcare Providers: BloggerCourse.com  This test is not yet approved or cleared by the [employer]ious[employer] FDA and has been authorized for detection and/or diagnosis of SARS-CoV-2 by FDA under an Emergency Use Authorization (EUA). This EUA will remain in effect (meaning this test can be used) for the duration of the COVID-19 declaration under Section 564(b)(1) of the Act, 21 U.S.C. section 360bbb-3(b)(1), unless the authorization is terminated or revoked.  Performed at [facility], [address], [place], [place]   Surgical pcr screen     Status: None   Collection Time: 08/30/21  5:29 AM   Specimen: Nasal Mucosa; Nasal Swab  Result Value Ref Range Status   MRSA, PCR NEGATIVE NEGATIVE Final   Staphylococcus aureus NEGATIVE NEGATIVE Final    Comment: (NOTE) The Xpert SA Assay (FDA approved for NASAL specimens in patients 106 years of age and older), is one component  of a comprehensive surveillance program. It is not intended to diagnose infection nor to guide or monitor treatment. Performed at [facility], [address] [place], [place] [address]          Radiology Studies: DG Chest Port 1 View  Result Date: 08/29/2021 CLINICAL DATA:  Pre-op. EXAM: PORTABLE CHEST 1 VIEW COMPARISON:  None. FINDINGS: The heart size and mediastinal contours are within normal limits. Both lungs are clear. No pleural effusion or pneumothorax. The visualized skeletal structures are unremarkable. IMPRESSION: No active disease. Electronically Signed   By: Sherron Ales M.D.   On:  08/29/2021 12:43   DG C-Arm 1-60 Min-No Report  Result Date: 08/30/2021 Fluoroscopy was utilized by the requesting physician.  No radiographic interpretation.   DG C-Arm 1-60 Min-No Report  Result Date: 08/30/2021 Fluoroscopy was utilized by the requesting physician.  No radiographic interpretation.   DG HIP OPERATIVE UNILAT WITH PELVIS RIGHT  Result Date: 08/30/2021 CLINICAL DATA:  Right total hip replacement. EXAM: OPERATIVE RIGHT HIP (WITH PELVIS IF PERFORMED) 10 VIEWS TECHNIQUE: Fluoroscopic spot image(s) were submitted for interpretation post-operatively. COMPARISON:  Right hip radiographs-08/29/2021 FINDINGS: Ten spot fluoroscopic images of the lower pelvis and right hip are provided for review and demonstrate the sequela right-sided bipolar hip replacement a dressing previously noted midcervical femoral neck fracture. Alignment appears anatomic given AP projection. There is a minimal amount of subcutaneous emphysema about the operative site. No radiopaque foreign body. IMPRESSION: Post bipolar right total hip replacement without evidence of complication. Electronically Signed   By: Simonne Come M.D.   On: 08/30/2021 09:50   DG Hip Unilat W or Wo Pelvis 2-3 Views Right  Result Date: 08/29/2021 CLINICAL DATA:  Larey Seat EXAM: DG HIP (WITH OR WITHOUT PELVIS) 2-3V RIGHT COMPARISON:  07/09/2021 FINDINGS: Impacted right mid cervical femoral neck fracture. No dislocation. Bony pelvis intact. IMPRESSION: 1. Impacted mid cervical right femoral neck fracture. Electronically Signed   By: Corlis Leak M.D.   On: 08/29/2021 12:10        Scheduled Meds:  [MAR Hold] atorvastatin  40 mg Oral QHS   [MAR Hold] azithromycin  250 mg Oral Daily   [MAR Hold] cholecalciferol  1,000 Units Oral Daily   [MAR Hold] docusate sodium  200 mg Oral Daily   [MAR Hold] insulin aspart  0-9 Units Subcutaneous TID WC   [MAR Hold] levothyroxine  150 mcg Oral QAC breakfast   [MAR Hold] loratadine  10 mg Oral Daily   [MAR  Hold] nortriptyline  50 mg Oral QHS   [MAR Hold] sertraline  50 mg Oral Daily   [MAR Hold] trimethoprim  100 mg Oral QHS   Continuous Infusions:  lactated ringers 10 mL/hr at 08/30/21 0705     LOS: 1 day    Time spent:    Zannie Cove, MD [facility]   08/30/2021, 10:18 AM

## 2021-08-30 NOTE — Op Note (Signed)
NAMELELAH, RENNAKER MEDICAL RECORD NO: [id number] ACCOUNT NO: [id number] DATE OF BIRTH: 1956/06/03 FACILITY: [facility] LOCATION: [facility] PHYSICIAN: Vanita Panda. Magnus Ivan, MD  Operative Report   DATE OF PROCEDURE: 08/30/2021  PREOPERATIVE DIAGNOSIS:  Right hip displaced femoral neck fracture.  POSTOPERATIVE DIAGNOSIS:  Right hip displaced femoral neck fracture.  PROCEDURE:  Right total hip arthroplasty through direct anterior approach.  IMPLANTS:  DePuy Sector Gription acetabular component size 52, size 36+0 neutral polyethylene liner, size 4 ACTIS femoral component with high offset, size 36+5 ceramic hip ball.  SURGEON:  Doneen Poisson, MD  ASSISTANT SURGEON:  Huel Cote, MD  ANESTHESIA:  Spinal.  ANTIBIOTICS:  2 g IV Ancef.  ESTIMATED BLOOD LOSS:  250 mL.  COMPLICATIONS:  None.  INDICATIONS:  The patient is a very active 65 year old female with no previous issues related to her hips or joints.  She sustained an unfortunate accidental mechanical fall going into the collosseum yesterday.  She had inability to ambulate and significant  right hip pain afterwards.  She was seen in the [facility] emergency room and found to have a displaced right hip femoral neck fracture.  She was graciously admitted to the medicine service.  She is diabetic, but has good control of diabetes.  She is  thin individual as well.  We talked in length and detail about the recommendation for surgery to treat this fracture.  We talked about hip replacement surgery versus nonoperative treatment.  I did discuss the various approaches with her as well.  We  talked about the risk of acute blood loss anemia, nerve or vessel injury, fracture, infection, dislocation, DVT, implant failure, skin and soft tissue issues and leg length differences.  We talked about our goals being hopefully decrease pain, improve  mobility and overall improve quality of life.  DESCRIPTION OF PROCEDURE:  After informed consent was  obtained, appropriate right hip was marked.  She was brought to the operating room, an axillary roll on her side on a stretcher and spinal anesthesia was obtained.  We then had her laid in supine  position.  A Foley catheter was placed and traction boots were placed on both her feet.  Next, she was placed supine on the Hana fracture table, the perineal post in place and both legs in line skeletal traction devices with no traction applied.  I then  assessed her hip radiographically to get a good preoperative assessment.  Her right operative hip was prepped and draped in DuraPrep and sterile drapes.  A timeout was called and she was identified as correct patient, correct right hip.  We then made an  incision just inferior and posterior to the anterior iliac spine and carried this slightly obliquely down the leg.  I dissected down to tensor fascia lata muscle.  Tensor fascia was then divided longitudinally to proceed with direct anterior approach to  the hip.  I then identified and cauterized circumflex vessels and identified the hip joint area and placed curved retractors around the medial and lateral femoral neck above the hip capsule.  We then opened up the hip capsule and found a hemarthrosis  consistent with a femoral neck fracture and you can see a displaced femoral neck fracture.  I then placed curved retractors within the joint capsule and made a refreshing femoral neck cut distal to the fracture, but just proximal to the lesser trochanter  with an oscillating saw and completed this with an osteotome and removed remnants of the fractured femoral neck.  I then placed  a corkscrew guide in the femoral head and removed the femoral head in its entirety and found it to have intact cartilage  throughout.  Also, the cartilage and the acetabulum was intact, but the fracture was significantly displaced and so this wants this surgery.  We then removed any debris from the acetabulum including remnants of acetabular  labrum.  I placed a bent Hohmann  over the medial acetabular rim and then began reaming under direct visualization from a size 43 reamer in a stepwise increments going all the way up to a size 51 reamer with all reamers placed under direct visualization. The last two reamers were also  placed under direct fluoroscopy, so I could obtain my depth of reaming, my inclination and anteversion.  I then placed a real DePuy Sector Gription acetabular component, size 52 with a 36+0 neutral polyethylene liner based on what we felt would be our  offset.  Attention was then turned to the femur.  With the leg externally rotated to 120 degrees and extended and adducted, I was able to place a Mueller retractor medially and Hohmann retractor behind the greater trochanter.  We released lateral joint  capsule and used a box cutting osteotome to enter the femoral canal and a rongeur to lateralize, then began broaching using the ACTIS broaching system from a size 0 going up to a size 4.  With a size 4 in place, we trialed a standard offset femoral neck  and a 36+1.5 hip ball.  We brought the leg back over in upward traction, internal rotation, reducing the pelvis and based on what we could see, we felt like we needed a little bit more offset and leg length.  We dislocated the hip, removed the trial  components.  We placed the real ACTIS femoral component, size 4 with a high offset component.  We decided to go with a 36+5 ceramic hip ball, reduced this in acetabulum.  I was pleased with leg length, offset, range of motion and stability assessed  mechanically and radiographically.  We then irrigated the soft tissue with normal saline solution using pulsatile lavage.  I was able to close the joint capsule completely with interrupted #1 Ethibond suture followed by #1 Vicryl to close the tensor  fascia and 0 Vicryl was used to close deep tissue and 2-0 Vicryl was used to close subcutaneous tissue.  The skin was closed with staples.   An Aquacel dressing was applied.  She was taken to recovery room in stable condition with all final counts being  correct.  No complications noted.  Of note, Dr. Steward Drone assisted him in the entire case from beginning to end and his assistance was crucial for facilitating all aspects of this case.   NIK D: 08/30/2021 9:32:43 am T: 08/30/2021 10:53:00 am  JOB: [id number]/ [id number]

## 2021-08-31 ENCOUNTER — Encounter (HOSPITAL_COMMUNITY): Payer: Self-pay | Admitting: Orthopaedic Surgery

## 2021-08-31 DIAGNOSIS — N1832 Chronic kidney disease, stage 3b: Secondary | ICD-10-CM

## 2021-08-31 DIAGNOSIS — S72001D Fracture of unspecified part of neck of right femur, subsequent encounter for closed fracture with routine healing: Secondary | ICD-10-CM | POA: Diagnosis not present

## 2021-08-31 LAB — BASIC METABOLIC PANEL
Anion gap: 6 (ref 5–15)
BUN: 13 mg/dL (ref 8–23)
CO2: 25 mmol/L (ref 22–32)
Calcium: 8.2 mg/dL — ABNORMAL LOW (ref 8.9–10.3)
Chloride: 106 mmol/L (ref 98–111)
Creatinine, Ser: 1.01 mg/dL — ABNORMAL HIGH (ref 0.44–1.00)
GFR, Estimated: >60 mL/min (ref >60)
Glucose, Bld: 166 mg/dL — ABNORMAL HIGH (ref 70–99)
Potassium: 3.7 mmol/L (ref 3.5–5.1)
Sodium: 137 mmol/L (ref 135–145)

## 2021-08-31 LAB — CBC
HCT: 27.6 % — ABNORMAL LOW (ref 36.0–46.0)
Hemoglobin: 9.4 g/dL — ABNORMAL LOW (ref 12.0–15.0)
MCH: 30.4 pg (ref 26.0–34.0)
MCHC: 34.1 g/dL (ref 30.0–36.0)
MCV: 89.3 fL (ref 80.0–100.0)
Platelets: 154 10*3/uL (ref 150–400)
RBC: 3.09 MIL/uL — ABNORMAL LOW (ref 3.87–5.11)
RDW: 13 % (ref 11.5–15.5)
WBC: 7.3 10*3/uL (ref 4.0–10.5)
nRBC: 0 % (ref 0.0–0.2)

## 2021-08-31 LAB — GLUCOSE, CAPILLARY
Glucose-Capillary: 166 mg/dL — ABNORMAL HIGH (ref 70–99)
Glucose-Capillary: 182 mg/dL — ABNORMAL HIGH (ref 70–99)
Glucose-Capillary: 166 mg/dL — ABNORMAL HIGH (ref 70–99)
Glucose-Capillary: 133 mg/dL — ABNORMAL HIGH (ref 70–99)

## 2021-08-31 MED ORDER — SENNOSIDES-DOCUSATE SODIUM 8.6-50 MG PO TABS
1.0000 | ORAL_TABLET | Freq: Two times a day (BID) | ORAL | Status: DC
  Administered 2021-08-31 – 2021-09-02 (×5): 1 via ORAL
  Filled 2021-08-31 (×5): qty 1

## 2021-08-31 MED ORDER — ENOXAPARIN SODIUM 40 MG/0.4ML IJ SOSY
40.0000 mg | PREFILLED_SYRINGE | INTRAMUSCULAR | Status: DC
  Administered 2021-08-31 – 2021-09-01 (×2): 40 mg via SUBCUTANEOUS
  Filled 2021-08-31 (×2): qty 0.4

## 2021-08-31 MED ORDER — ASPIRIN 81 MG PO CHEW: 81.0000 mg | CHEWABLE_TABLET | Freq: Two times a day (BID) | ORAL | 0 refills | Status: DC

## 2021-08-31 MED ORDER — METHOCARBAMOL 500 MG PO TABS: 500.0000 mg | ORAL_TABLET | Freq: Four times a day (QID) | ORAL | 1 refills | Status: DC | PRN

## 2021-08-31 MED ORDER — ADULT MULTIVITAMIN W/MINERALS CH
1.0000 | ORAL_TABLET | Freq: Every day | ORAL | Status: DC
  Administered 2021-08-31 – 2021-09-02 (×3): 1 via ORAL
  Filled 2021-08-31 (×3): qty 1

## 2021-08-31 MED ORDER — GLUCERNA SHAKE PO LIQD
237.0000 mL | Freq: Three times a day (TID) | ORAL | Status: DC
  Administered 2021-08-31 – 2021-09-01 (×4): 237 mL via ORAL

## 2021-08-31 MED ORDER — HYDROCODONE-ACETAMINOPHEN 5-325 MG PO TABS
1.0000 | ORAL_TABLET | Freq: Four times a day (QID) | ORAL | 0 refills | Status: DC | PRN
Start: 2021-08-31 — End: 2021-09-15

## 2021-08-31 MED ORDER — POLYETHYLENE GLYCOL 3350 17 G PO PACK
17.0000 g | PACK | Freq: Every day | ORAL | Status: DC
  Administered 2021-08-31 – 2021-09-02 (×3): 17 g via ORAL
  Filled 2021-08-31 (×3): qty 1

## 2021-08-31 NOTE — Progress Notes (Signed)
PROGRESS NOTE    Bianca Phillips  [id number] DOB: 19-Mar-1956 DOA: 08/29/2021 PCP: Orma Render, NP  Brief Narrative: 65/F with history of type 2 diabetes mellitus, CKD 3, hypothyroidism, dyslipidemia, palpitations, anxiety presented to the ED following a mechanical fall with right hip pain In the emergency room she was noted to have an impacted right femoral neck fracture  Assessment & Plan:  Closed right hip fracture ([place]) -Orthopedics consulted,  underwent total right hip arthroplasty 11/27-Dr. Ninfa Linden -Start Lovenox for DVT prophylaxis -Pain control, discontinue IV fluids -PT eval today  CKD 3a -Creatinine stable  Type 2 diabetes mellitus -Controlled, hemoglobin A1c was 6.6 in May -Metformin on hold, sliding scale insulin  Dyslipidemia -Continue Lipitor  History of SVT -On as needed metoprolol  Hypothyroidism -Continue Synthroid  History of recurrent UTIs -No symptoms at this time, monitor  DVT prophylaxis: Lovenox Code Status: Full code Family Communication: Discussed with patient, no family at bedside Disposition Plan:  Status is: Inpatient  Remains inpatient appropriate because: Severity of illness  Consultants:  Orthopedics  Procedures: Right total hip arthroplasty Dr. Ninfa Linden 11/27 Antimicrobials:    Subjective: -Feels okay overall, worked with PT, sitting up in the recliner now  Objective: Vitals:   08/30/21 1419 08/30/21 2010 08/31/21 0505 08/31/21 0745  BP: 138/71 (!) 144/84 (!) 116/56 107/73  Pulse: (!) 108 100 (!) 102 (!) 105  Resp: 16 18 20 17   Temp: 98.3 F (36.8 C) 98.6 F (37 C) 98.1 F (36.7 C) 98.7 F (37.1 C)  TempSrc: Oral Oral Oral Oral  SpO2: 98% 96% 99% 92%  Weight:      Height:        Intake/Output Summary (Last 24 hours) at 08/31/2021 1331 Last data filed at 08/31/2021 1049 Gross per 24 hour  Intake 1709.4 ml  Output 600 ml  Net 1109.4 ml   Filed Weights   08/29/21 1211 08/30/21 0402  Weight: 66.7 kg 69.9  kg    Examination:   Gen: AAOx3, no distress HEENT: No JVD CVS: S1-S2, regular rate rhythm Lungs: Clear anteriorly Abdomen: Soft, nontender, bowel sounds present Extremities: Right hip wound with dressing  Skin: no new rashes on exposed skin     Data Reviewed:   CBC: Recent Labs  Lab 08/29/21 1213 08/30/21 0445 08/31/21 0357  WBC 6.5 8.8 7.3  NEUTROABS 4.3  --   --   HGB 13.3 12.3 9.4*  HCT 40.7 37.5 27.6*  MCV 89.6 88.7 89.3  PLT 201 222 [id number]   Basic Metabolic Panel: Recent Labs  Lab 08/29/21 1213 08/30/21 0445 08/31/21 0357  NA 138 135 137  K 3.9 3.9 3.7  CL 105 100 106  CO2 25 26 25   GLUCOSE 161* 188* 166*  BUN 19 18 13   CREATININE 1.08* 1.00 1.01*  CALCIUM 9.1 9.0 8.2*   GFR: Estimated Creatinine Clearance: 53.1 mL/min (A) (by C-G formula based on SCr of 1.01 mg/dL (H)). Liver Function Tests: No results for input(s): AST, ALT, ALKPHOS, BILITOT, PROT, ALBUMIN in the last 168 hours. No results for input(s): LIPASE, AMYLASE in the last 168 hours. No results for input(s): AMMONIA in the last 168 hours. Coagulation Profile: No results for input(s): INR, PROTIME in the last 168 hours. Cardiac Enzymes: No results for input(s): CKTOTAL, CKMB, CKMBINDEX, TROPONINI in the last 168 hours. BNP (last 3 results) No results for input(s): PROBNP in the last 8760 hours. HbA1C: No results for input(s): HGBA1C in the last 72 hours. CBG: Recent Labs  Lab 08/30/21  1620 08/30/21 2013 08/30/21 2306 08/31/21 0811 08/31/21 1138  GLUCAP 244* 183* 218* 166* 182*   Lipid Profile: No results for input(s): CHOL, HDL, LDLCALC, TRIG, CHOLHDL, LDLDIRECT in the last 72 hours. Thyroid Function Tests: No results for input(s): TSH, T4TOTAL, FREET4, T3FREE, THYROIDAB in the last 72 hours. Anemia Panel: No results for input(s): VITAMINB12, FOLATE, FERRITIN, TIBC, IRON, RETICCTPCT in the last 72 hours. Urine analysis:    Component Value Date/Time   BILIRUBINUR Negative  06/24/2021 1619   KETONESUR negative 02/05/2021 1459   PROTEINUR Negative 06/24/2021 1619   UROBILINOGEN 0.2 06/24/2021 1619   NITRITE Negative 06/24/2021 1619   LEUKOCYTESUR Small (1+) (A) 06/24/2021 1619   Sepsis Labs: @LABRCNTIP (procalcitonin:4,lacticidven:4)  ) Recent Results (from the past 240 hour(s))  Resp Panel by RT-PCR (Flu A&B, Covid) Nasopharyngeal Swab     Status: None   Collection Time: 08/29/21 12:12 PM   Specimen: Nasopharyngeal Swab; Nasopharyngeal(NP) swabs in vial transport medium  Result Value Ref Range Status   SARS Coronavirus 2 by RT PCR NEGATIVE NEGATIVE Final    Comment: (NOTE) SARS-CoV-2 target nucleic acids are NOT DETECTED.  The SARS-CoV-2 RNA is generally detectable in upper respiratory specimens during the acute phase of infection. The lowest concentration of SARS-CoV-2 viral copies this assay can detect is 138 copies/mL. A negative result does not preclude SARS-Cov-2 infection and should not be used as the sole basis for treatment or other patient management decisions. A negative result may occur with  improper specimen collection/handling, submission of specimen other than nasopharyngeal swab, presence of viral mutation(s) within the areas targeted by this assay, and inadequate number of viral copies(<138 copies/mL). A negative result must be combined with clinical observations, patient history, and epidemiological information. The expected result is Negative.  Fact Sheet for Patients:  08/31/21  Fact Sheet for Healthcare Providers:  BloggerCourse.com  This test is no t yet approved or cleared by the SeriousBroker.it FDA and  has been authorized for detection and/or diagnosis of SARS-CoV-2 by FDA under an Emergency Use Authorization (EUA). This EUA will remain  in effect (meaning this test can be used) for the duration of the COVID-19 declaration under Section 564(b)(1) of the Act,  21 U.S.C.section 360bbb-3(b)(1), unless the authorization is terminated  or revoked sooner.       Influenza A by PCR NEGATIVE NEGATIVE Final   Influenza B by PCR NEGATIVE NEGATIVE Final    Comment: (NOTE) The Xpert Xpress SARS-CoV-2/FLU/RSV plus assay is intended as an aid in the diagnosis of influenza from Nasopharyngeal swab specimens and should not be used as a sole basis for treatment. Nasal washings and aspirates are unacceptable for Xpert Xpress SARS-CoV-2/FLU/RSV testing.  Fact Sheet for Patients: [place]  Fact Sheet for Healthcare Providers: BloggerCourse.com  This test is not yet approved or cleared by the SeriousBroker.it FDA and has been authorized for detection and/or diagnosis of SARS-CoV-2 by FDA under an Emergency Use Authorization (EUA). This EUA will remain in effect (meaning this test can be used) for the duration of the COVID-19 declaration under Section 564(b)(1) of the Act, 21 U.S.C. section 360bbb-3(b)(1), unless the authorization is terminated or revoked.  Performed at [facility], [address], [place], [place]   Surgical pcr screen     Status: None   Collection Time: 08/30/21  5:29 AM   Specimen: Nasal Mucosa; Nasal Swab  Result Value Ref Range Status   MRSA, PCR NEGATIVE NEGATIVE Final   Staphylococcus aureus NEGATIVE NEGATIVE Final  Comment: (NOTE) The Xpert SA Assay (FDA approved for NASAL specimens in patients 29 years of age and older), is one component of a comprehensive surveillance program. It is not intended to diagnose infection nor to guide or monitor treatment. Performed at [facility], [place] [address] [place], [place] [address]          Radiology Studies: DG Pelvis Portable  Result Date: 08/30/2021 CLINICAL DATA:  Status post total replacement of right hip. EXAM: PORTABLE PELVIS 1-2 VIEWS COMPARISON:  Hip radiograph 08/29/2021 FINDINGS:  Interval placement of right total hip arthroplasty. Prosthesis is intact and in appropriate position. Small amount of subcutaneous emphysema noted at the right hip joint, consistent with recent surgery. Surgical skin staples overlie the right hip. No pelvic fracture. 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 IMPRESSION: Interval postoperative changes of right total hip arthroplasty. Electronically Signed   By: Ileana Roup M.D.   On: 08/30/2021 14:08   DG C-Arm 1-60 Min-No Report  Result Date: 08/30/2021 CLINICAL DATA:  Right total hip replacement. EXAM: OPERATIVE RIGHT HIP (WITH PELVIS IF PERFORMED) 10 VIEWS TECHNIQUE: Fluoroscopic spot image(s) were submitted for interpretation post-operatively. COMPARISON:  Right hip radiographs-08/29/2021 FINDINGS: Ten spot fluoroscopic images of the lower pelvis and right hip are provided for review and demonstrate the sequela right-sided bipolar hip replacement a dressing previously noted midcervical femoral neck fracture. Alignment appears anatomic given AP projection. There is a minimal amount of subcutaneous emphysema about the operative site. No radiopaque foreign body. IMPRESSION: Post bipolar right total hip replacement without evidence of complication. Electronically Signed   By: Sandi Mariscal M.D.   On: 08/30/2021 09:50   DG C-Arm 1-60 Min-No Report  Result Date: 08/30/2021 CLINICAL DATA:  Right total hip replacement. EXAM: OPERATIVE RIGHT HIP (WITH PELVIS IF PERFORMED) 10 VIEWS TECHNIQUE: Fluoroscopic spot image(s) were submitted for interpretation post-operatively. COMPARISON:  Right hip radiographs-08/29/2021 FINDINGS: Ten spot fluoroscopic images of the lower pelvis and right hip are provided for review and demonstrate the sequela right-sided bipolar hip replacement a dressing previously noted midcervical femoral neck fracture. Alignment appears anatomic given AP projection. There is a minimal amount of subcutaneous emphysema about the operative site. No  radiopaque foreign body. IMPRESSION: Post bipolar right total hip replacement without evidence of complication. Electronically Signed   By: Sandi Mariscal M.D.   On: 08/30/2021 09:50   DG HIP OPERATIVE UNILAT WITH PELVIS RIGHT  Result Date: 08/30/2021 CLINICAL DATA:  Right total hip replacement. EXAM: OPERATIVE RIGHT HIP (WITH PELVIS IF PERFORMED) 10 VIEWS TECHNIQUE: Fluoroscopic spot image(s) were submitted for interpretation post-operatively. COMPARISON:  Right hip radiographs-08/29/2021 FINDINGS: Ten spot fluoroscopic images of the lower pelvis and right hip are provided for review and demonstrate the sequela right-sided bipolar hip replacement a dressing previously noted midcervical femoral neck fracture. Alignment appears anatomic given AP projection. There is a minimal amount of subcutaneous emphysema about the operative site. No radiopaque foreign body. IMPRESSION: Post bipolar right total hip replacement without evidence of complication. Electronically Signed   By: Sandi Mariscal M.D.   On: 08/30/2021 09:50        Scheduled Meds:  aspirin  81 mg Oral BID   atorvastatin  40 mg Oral QHS   azithromycin  250 mg Oral Daily   Chlorhexidine Gluconate Cloth  6 each Topical Daily   cholecalciferol  1,000 Units Oral Daily   enoxaparin (LOVENOX) injection  40 mg Subcutaneous Q24H   insulin aspart  0-9 Units Subcutaneous TID WC  levothyroxine  150 mcg Oral QAC breakfast   loratadine  10 mg Oral Daily   nortriptyline  50 mg Oral QHS   pantoprazole  40 mg Oral Daily   polyethylene glycol  17 g Oral Daily   senna-docusate  1 tablet Oral BID   sertraline  50 mg Oral Daily   [START ON 09/01/2021] trimethoprim  100 mg Oral QHS   Continuous Infusions:  methocarbamol (ROBAXIN) IV       LOS: 2 days    Time spent: 76min  Domenic Polite, MD [facility]   08/31/2021, 1:31 PM

## 2021-08-31 NOTE — TOC Progression Note (Addendum)
Transition of Care [facility]) - Progression Note    Patient Details  Name: Bianca Phillips MRN: [id number] Date of Birth: 06-25-1956  Transition of [facility]) CM/SW Contact  Beckie Busing, RN Phone Number:[phone]  08/31/2021, 2:46 PM  Clinical Narrative:    [facility] consulted for DME needs. Rolling walker and 3 in 1 ordered per [employer] [facility]. Spoke with [facility]. DME to be delivered to the bedside. HH PT orders noted. CM at bedside to offer choice.  HH has been set up with [facility]. No other needs at this time. TOC will continue to follow.   1540 DME has been delivered to the room. Per daughter at bedside she will take DME home.         Expected Discharge Plan and Services                             DME Agency: [employer] Date DME Agency Contacted: 08/31/21 Time DME Agency Contacted: [phone] Representative spoke with at DME Agency: Leavy Cella             Social Determinants of Health (SDOH) Interventions    Readmission Risk Interventions No flowsheet data found.

## 2021-08-31 NOTE — Progress Notes (Signed)
Subjective: 1 Day Post-Op Procedure(s) (LRB): TOTAL HIP ARTHROPLASTY ANTERIOR APPROACH (Right) Patient reports pain as moderate.  Awake and alert this am.  Acute blood loss anemia from her surgery, her fracture and some dilutional.  Vitals stable.  Has no been up with therapy yet.  Objective: Vital signs in last 24 hours: Temp:  [97.7 F (36.5 C)-99.9 F (37.7 C)] 98.1 F (36.7 C) (11/28 0505) Pulse Rate:  [85-108] 102 (11/28 0505) Resp:  [15-20] 20 (11/28 0505) BP: (111-150)/(55-117) 116/56 (11/28 0505) SpO2:  [92 %-99 %] 99 % (11/28 0505)  Intake/Output from previous day: 11/27 0701 - 11/28 0700 In: 1553.6 [I.V.:1253.6; IV Piggyback:300] Out: 1150 [Urine:900; Blood:250] Intake/Output this shift: No intake/output data recorded.  Recent Labs    08/29/21 1213 08/30/21 0445 08/31/21 0357  HGB 13.3 12.3 9.4*   Recent Labs    08/30/21 0445 08/31/21 0357  WBC 8.8 7.3  RBC 4.23 3.09*  HCT 37.5 27.6*  PLT 222 154   Recent Labs    08/30/21 0445 08/31/21 0357  NA 135 137  K 3.9 3.7  CL 100 106  CO2 26 25  BUN 18 13  CREATININE 1.00 1.01*  GLUCOSE 188* 166*  CALCIUM 9.0 8.2*   No results for input(s): LABPT, INR in the last 72 hours.  Sensation intact distally Intact pulses distally Dorsiflexion/Plantar flexion intact Incision: dressing C/D/I   Assessment/Plan: 1 Day Post-Op Procedure(s) (LRB): TOTAL HIP ARTHROPLASTY ANTERIOR APPROACH (Right) Up with therapy WBAT right hip     Bianca Phillips 08/31/2021, 7:44 AM

## 2021-08-31 NOTE — Progress Notes (Signed)
Initial Nutrition Assessment  DOCUMENTATION CODES:  Not applicable  INTERVENTION:  Add Glucerna Shake po TID, each supplement provides 220 kcal and 10 grams of protein.  Add MVI with minerals daily.  Encourage PO and supplement intake.  NUTRITION DIAGNOSIS:  Increased nutrient needs related to post-op healing as evidenced by estimated needs.  GOAL:  Patient will meet greater than or equal to 90% of their needs  MONITOR:  PO intake, Supplement acceptance, Labs, Weight trends, Skin, I & O's  REASON FOR ASSESSMENT:  Consult Hip fracture protocol  ASSESSMENT:  65 yo female with a PMH of T2DM, CKD stage 3, hypothyroidism, dyslipidemia, palpitations, and anxiety who presents with R hip pain. Admitted with R hip fracture. 11/27 - total R hip arthroplasty  Spoke with pt at bedside. Pt reports no changes in appetite and no changes in eating habits lately. She reports eating well and just trying to pass a BM.  No meals documented in Epic.  Pt denies any changes in her weight. Per Epic, pt's weight increased from previous visits. Of note, pt with RLE edema.  Recommend Glucerna shakes TID and MVI with minerals daily to aid in wound healing.  Medications: reviewed; Vitamin D3, SSI, Synthroid, Protonix, miralax, Vicodin PO PRN (given twice today)  Labs: reviewed; CBG 166-218 (H) HbA1c: 6.6% (02/06/2021)  NUTRITION - FOCUSED PHYSICAL EXAM: Flowsheet Row Most Recent Value  Orbital Region Mild depletion  Upper Arm Region No depletion  Thoracic and Lumbar Region No depletion  Buccal Region Mild depletion  Temple Region Mild depletion  Clavicle Bone Region No depletion  Clavicle and Acromion Bone Region No depletion  Scapular Bone Region No depletion  Dorsal Hand No depletion  Patellar Region No depletion  Anterior Thigh Region No depletion  Posterior Calf Region No depletion  Edema (RD Assessment) Mild  [RLE]  Hair Reviewed  Eyes Reviewed  Mouth Reviewed  Skin Reviewed  Nails  Reviewed   Diet Order:   Diet Order             Diet Carb Modified Fluid consistency: Thin; Room service appropriate? Yes  Diet effective now                  EDUCATION NEEDS:  Education needs have been addressed  Skin:  Skin Assessment: Skin Integrity Issues: Skin Integrity Issues:: Incisions Incisions: R hip, closed (11/27)  Last BM:  08/29/21  Height:  Ht Readings from Last 1 Encounters:  08/30/21 5\' 5"  (1.651 m)   Weight:  Wt Readings from Last 1 Encounters:  08/30/21 69.9 kg   BMI:  Body mass index is 25.64 kg/m.  Estimated Nutritional Needs:  Kcal:  1800-2000 Protein:  90-105 grams Fluid:  >1.8 L  09/01/21, RD, LDN (she/her/hers) Clinical Inpatient Dietitian RD Pager/After-Hours/Weekend Pager # in [place]

## 2021-08-31 NOTE — Discharge Instructions (Signed)

## 2021-08-31 NOTE — Progress Notes (Signed)
Physical Therapy Treatment Patient Details Name: Bianca Phillips MRN: [id number] DOB: April 12, 1956 Today's Date: 08/31/2021   History of Present Illness Patient is a 65 y/o female who presents with hip fx s/p fall now s/p Rt THA, anterior approach 08/30/21. PMH includes DM, Covid-19.    PT Comments    Patient progressing well with mobility this afternoon. Session focused on progressive ambulation and exercise. Provided HEP handout and reviewed exercises. Instructed pt on how to use gait belt to assist with bringing RLE into/out of bed. Continues to require assist managing RLE. Will plan for stair training tomorrow. Will likely need a few more days of therapy in the hospital prior to d/c home as pt does not have support at home during the day and needs to be mod I for mobility. Will follow.   Recommendations for follow up therapy are one component of a multi-disciplinary discharge planning process, led by the attending physician.  Recommendations may be updated based on patient status, additional functional criteria and insurance authorization.  Follow Up Recommendations  Follow physician's recommendations for discharge plan and follow up therapies     Assistance Recommended at Discharge Intermittent Supervision/Assistance  Equipment Recommendations  BSC/3in1    Recommendations for Other Services       Precautions / Restrictions Precautions Precautions: Fall Restrictions Weight Bearing Restrictions: Yes RLE Weight Bearing: Weight bearing as tolerated     Mobility  Bed Mobility Overal bed mobility: Needs Assistance Bed Mobility: Supine to Sit;Sit to Supine     Supine to sit: Mod assist;HOB elevated Sit to supine: Min assist;HOB elevated   General bed mobility comments: Step by step cues for sequencing. Provided pt with gait belt to loop RLE to assist with getting in bed, assist with RLE and trunk to get to EOB.    Transfers Overall transfer level: Needs assistance Equipment  used: Rolling walker (2 wheels) Transfers: Sit to/from Stand             General transfer comment: Assist to power to standing with cues for hand placement/technique as pt wanting to pull up on RW. Stood from [employer], from chair x1, lowering self abruptly into chair due to pain.    Ambulation/Gait Ambulation/Gait assistance: Min guard Gait Distance (Feet): 50 Feet Assistive device: Rolling walker (2 wheels) Gait Pattern/deviations: Step-to pattern;Decreased step length - right;Decreased weight shift to right;Step-through pattern Gait velocity: decreased     General Gait Details: Cues for sequencing with RW and for step through pattern with knee flexion during stance phase; a few standing rest breaks.   Stairs             Wheelchair Mobility    Modified Rankin (Stroke Patients Only)       Balance Overall balance assessment: Needs assistance Sitting-balance support: Feet supported;No upper extremity supported Sitting balance-Leahy Scale: Good     Standing balance support: During functional activity;Reliant on assistive device for balance Standing balance-Leahy Scale: Poor Standing balance comment: Requires UE support.                            Cognition Arousal/Alertness: Awake/alert Behavior During Therapy: Anxious Overall Cognitive Status: Impaired/Different from baseline Area of Impairment: Memory                     Memory: Decreased short-term memory         General Comments: Needs repetition at times to follow commands or continue performing exercises, "now  what was I doing again?"        Exercises Total Joint Exercises Quad Sets: AROM;Both;10 reps;Supine Heel Slides: AAROM;Right;10 reps;Supine Hip ABduction/ADduction: AAROM;Right;10 reps;Supine    General Comments General comments (skin integrity, edema, etc.): Daughter present during session.      Pertinent Vitals/Pain Pain Assessment: 0-10 Pain Score: 6  Pain  Location: RLE Pain Descriptors / Indicators: Aching;Discomfort;Sore;Operative site guarding Pain Intervention(s): Monitored during session;Patient requesting pain meds-RN notified;Limited activity within patient's tolerance;Repositioned    Home Living                          Prior Function            PT Goals (current goals can now be found in the care plan section) Progress towards PT goals: Progressing toward goals    Frequency    7X/week      PT Plan Current plan remains appropriate    Co-evaluation              AM-PAC PT "6 Clicks" Mobility   Outcome Measure  Help needed turning from your back to your side while in a flat bed without using bedrails?: A Little Help needed moving from lying on your back to sitting on the side of a flat bed without using bedrails?: A Lot Help needed moving to and from a bed to a chair (including a wheelchair)?: A Little Help needed standing up from a chair using your arms (e.g., wheelchair or bedside chair)?: A Little Help needed to walk in hospital room?: A Little Help needed climbing 3-5 steps with a railing? : A Lot 6 Click Score: 16    End of Session Equipment Utilized During Treatment: Gait belt Activity Tolerance: Patient tolerated treatment well;Patient limited by pain Patient left: in bed;with call bell/phone within reach;with bed alarm set;with family/visitor present Nurse Communication: Mobility status PT Visit Diagnosis: Pain;Difficulty in walking, not elsewhere classified (R26.2);Unsteadiness on feet (R26.81) Pain - Right/Left: Right Pain - part of body: Hip;Leg     Time: 1353-1430 PT Time Calculation (min) (ACUTE ONLY): 37 min  Charges:  $Gait Training: 8-22 mins $Therapeutic Exercise: 8-22 mins                     Bianca Phillips, PT, DPT Acute Rehabilitation Services Pager [phone] Office [phone]      Bianca Phillips 08/31/2021, 3:19 PM

## 2021-08-31 NOTE — TOC CAGE-AID Note (Signed)
Transition of Care [facility]) - CAGE-AID Screening   Patient Details  Name: Bianca Phillips MRN: [id number] Date of Birth: December 08, 1955  Transition of Care [facility]) CM/SW Contact:    Davionte Lusby C Tarpley-Carter, LCSWA Phone Number: 08/31/2021, 11:26 AM   Clinical Narrative: Pt participated in Cage-Aid.  Pt stated she does not use substance or ETOH.  Pt was not offered resources, due to no usage of substance or ETOH.     Tashaya Ancrum Tarpley-Carter, MSW, LCSW-A Pronouns:  She/Her/Hers [facility]itions of Care Clinical Social Worker Direct Number:  [phone] [email or web address]    CAGE-AID Screening:    Have You Ever Felt You Ought to Cut Down on Your Drinking or Drug Use?: No Have People Annoyed You By [employer] Your Drinking Or Drug Use?: No Have You Felt Bad Or Guilty About Your Drinking Or Drug Use?: No Have You Ever Had a Drink or Used Drugs First Thing In The Morning to Steady Your Nerves or to Get Rid of a Hangover?: No CAGE-AID Score: 0  Substance Abuse Education Offered: No

## 2021-08-31 NOTE — Evaluation (Signed)
Physical Therapy Evaluation Patient Details Name: Bianca Phillips MRN: [id number] DOB: Jun 18, 1956 Today's Date: 08/31/2021  History of Present Illness  Patient is a 65 y/o female who presents with hip fx s/p fall now s/p Rt THA, anterior approach 08/30/21. PMH includes DM, Covid-19.  Clinical Impression  Patient presents with post surgical deficits, pain and impaired mobility s/p above surgery. Pt lives alone and independent for ambulation/ADLs PTA. Today, pt anxious about mobility and requires Mod A for bed mobility and standing transfers. Tolerated gait training with min guard assist and use of RW for support. Instructed pt in there ex and explained importance of elevation/mobility. Pt plans to d/c home with daughter and her family. Will need to negotiate stairs to enter home. Will follow acutely to maximize independence and mobility prior to return home.       Recommendations for follow up therapy are one component of a multi-disciplinary discharge planning process, led by the attending physician.  Recommendations may be updated based on patient status, additional functional criteria and insurance authorization.  Follow Up Recommendations Follow physician's recommendations for discharge plan and follow up therapies    Assistance Recommended at Discharge Intermittent Supervision/Assistance  Functional Status Assessment Patient has had a recent decline in their functional status and demonstrates the ability to make significant improvements in function in a reasonable and predictable amount of time.  Equipment Recommendations  Rolling walker (2 wheels);BSC/3in1    Recommendations for Other Services       Precautions / Restrictions Precautions Precautions: Fall Restrictions Weight Bearing Restrictions: Yes RLE Weight Bearing: Weight bearing as tolerated      Mobility  Bed Mobility Overal bed mobility: Needs Assistance Bed Mobility: Supine to Sit     Supine to sit: Mod assist;HOB  elevated     General bed mobility comments: Step by step cues to flex left knee and push through heel to advance bottom, assist with RLE, trunk and scooting bottom to EOB, use of rail. + dizziness.    Transfers Overall transfer level: Needs assistance Equipment used: Rolling walker (2 wheels) Transfers: Sit to/from Stand Sit to Stand: Mod assist;From elevated surface           General transfer comment: Assist to power to standing with cues for hand placement/technique. Stood from [employer], from toilet x1, transferred to chair post ambulation.    Ambulation/Gait Ambulation/Gait assistance: Min guard Gait Distance (Feet): 15 Feet (x2 bouts) Assistive device: Rolling walker (2 wheels) Gait Pattern/deviations: Step-to pattern;Decreased step length - right;Decreased weight shift to right Gait velocity: decreased     General Gait Details: Slow, unsteady gait with difficulty advancing RLE, tends to scoot/shuffle foot to advance at times, cues for sequencing.  Stairs            Wheelchair Mobility    Modified Rankin (Stroke Patients Only)       Balance Overall balance assessment: Needs assistance Sitting-balance support: Feet supported;No upper extremity supported Sitting balance-Leahy Scale: Good     Standing balance support: During functional activity;Reliant on assistive device for balance Standing balance-Leahy Scale: Poor Standing balance comment: Requires UE support.                             Pertinent Vitals/Pain Pain Assessment: 0-10 Pain Score: 5  Pain Location: RLE Pain Descriptors / Indicators: Aching;Discomfort;Sore;Operative site guarding Pain Intervention(s): Monitored during session;Premedicated before session;Limited activity within patient's tolerance    Home Living Family/patient expects to be discharged  to:: Private residence Living Arrangements: Alone Available Help at Discharge: Family;Available PRN/intermittently (planning to  stay with daughter at dc) Type of Home: House Home Access: Stairs to enter Entrance Stairs-Rails: None Entrance Stairs-Number of Steps: 3 at the front no rails;  garage entrance has 2 steps no rails   Home Layout: Two level;Able to live on main level with bedroom/bathroom Home Equipment: None Additional Comments: plans to go home with daugther at [place], would be able to live on main floor but daughter's home only has half bath- would have to go up flight or go to her first floor apt to bathe    Prior Function Prior Level of Function : Independent/Modified Independent             Mobility Comments: hx of falls in parking lots/parking garages/etc       Hand Dominance   Dominant Hand: Right    Extremity/Trunk Assessment   Upper Extremity Assessment Upper Extremity Assessment: Defer to OT evaluation    Lower Extremity Assessment Lower Extremity Assessment: RLE deficits/detail RLE Deficits / Details: post surgical deficits- able to perform LAQ, QS. RLE Sensation: WNL    Cervical / Trunk Assessment Cervical / Trunk Assessment: Normal  Communication   Communication: No difficulties  Cognition Arousal/Alertness: Awake/alert Behavior During Therapy: Anxious Overall Cognitive Status: Within Functional Limits for tasks assessed                                          General Comments General comments (skin integrity, edema, etc.): VSS on RA. Bandage -clean, dry and intact.    Exercises Total Joint Exercises Ankle Circles/Pumps: AROM;Both;15 reps;Supine Quad Sets: AROM;Both;10 reps;Supine Long Arc Quad: Strengthening;Right;5 reps;Seated   Assessment/Plan    PT Assessment Patient needs continued PT services  PT Problem List Decreased strength;Decreased mobility;Pain;Decreased balance;Decreased knowledge of use of DME;Decreased skin integrity;Decreased range of motion;Decreased activity tolerance       PT Treatment Interventions Therapeutic  activities;DME instruction;Gait training;Stair training;Functional mobility training;Balance training;Therapeutic exercise;Patient/family education    PT Goals (Current goals can be found in the Care Plan section)  Acute Rehab PT Goals Patient Stated Goal: to get better PT Goal Formulation: With patient Time For Goal Achievement: 09/14/21 Potential to Achieve Goals: Good    Frequency 7X/week   Barriers to discharge Inaccessible home environment stairs    Co-evaluation               AM-PAC PT "6 Clicks" Mobility  Outcome Measure Help needed turning from your back to your side while in a flat bed without using bedrails?: A Little Help needed moving from lying on your back to sitting on the side of a flat bed without using bedrails?: A Lot Help needed moving to and from a bed to a chair (including a wheelchair)?: A Lot Help needed standing up from a chair using your arms (e.g., wheelchair or bedside chair)?: A Lot Help needed to walk in hospital room?: A Little Help needed climbing 3-5 steps with a railing? : A Lot 6 Click Score: 14    End of Session Equipment Utilized During Treatment: Gait belt Activity Tolerance: Patient tolerated treatment well Patient left: in chair;with call bell/phone within reach Nurse Communication: Mobility status PT Visit Diagnosis: Pain;Difficulty in walking, not elsewhere classified (R26.2);Unsteadiness on feet (R26.81) Pain - Right/Left: Right Pain - part of body: Hip;Leg    Time: [id number] PT Time  Calculation (min) (ACUTE ONLY): 28 min   Charges:   PT Evaluation $PT Eval Moderate Complexity: 1 Mod PT Treatments $Gait Training: 8-22 mins        Vale Haven, PT, DPT Acute Rehabilitation Services Pager [phone] Office [phone]     Blake Divine A Lanier Ensign 08/31/2021, 9:27 AM

## 2021-09-01 DIAGNOSIS — S72001D Fracture of unspecified part of neck of right femur, subsequent encounter for closed fracture with routine healing: Secondary | ICD-10-CM | POA: Diagnosis not present

## 2021-09-01 DIAGNOSIS — N1832 Chronic kidney disease, stage 3b: Secondary | ICD-10-CM | POA: Diagnosis not present

## 2021-09-01 LAB — CBC
HCT: 27.6 % — ABNORMAL LOW (ref 36.0–46.0)
Hemoglobin: 8.9 g/dL — ABNORMAL LOW (ref 12.0–15.0)
MCH: 29.5 pg (ref 26.0–34.0)
MCHC: 32.2 g/dL (ref 30.0–36.0)
MCV: 91.4 fL (ref 80.0–100.0)
Platelets: 146 10*3/uL — ABNORMAL LOW (ref 150–400)
RBC: 3.02 MIL/uL — ABNORMAL LOW (ref 3.87–5.11)
RDW: 12.9 % (ref 11.5–15.5)
WBC: 7 10*3/uL (ref 4.0–10.5)
nRBC: 0 % (ref 0.0–0.2)

## 2021-09-01 LAB — GLUCOSE, CAPILLARY
Glucose-Capillary: 141 mg/dL — ABNORMAL HIGH (ref 70–99)
Glucose-Capillary: 145 mg/dL — ABNORMAL HIGH (ref 70–99)
Glucose-Capillary: 108 mg/dL — ABNORMAL HIGH (ref 70–99)
Glucose-Capillary: 160 mg/dL — ABNORMAL HIGH (ref 70–99)

## 2021-09-01 NOTE — Care Management Important Message (Signed)
Important Message  Patient Details  Name: Bianca Phillips MRN: [id number] Date of Birth: [birth year]   Medicare Important Message Given:  Yes     Wadie Lessen 09/01/2021, 12:16 PM

## 2021-09-01 NOTE — Progress Notes (Signed)
PROGRESS NOTE    Bianca Phillips  [id number] DOB: August 01, 1956 DOA: 08/29/2021 PCP: Orma Render, NP  Brief Narrative: 65/F with history of type 2 diabetes mellitus, CKD 3, hypothyroidism, dyslipidemia, palpitations, anxiety presented to the ED following a mechanical fall with right hip pain In the emergency room she was noted to have an impacted right femoral neck fracture  Assessment & Plan:  Closed right hip fracture [facility]) -Orthopedics consulted,  underwent total right hip arthroplasty 11/27-Dr. Ninfa Linden -Continue Lovenox for DVT prophylaxis -PT following, plan for home with home health services tomorrow -Pain controlled, labs in a.m.  Postop blood loss anemia -With a component of hemodilution, not symptomatic at this time, continue to trend  CKD 3a -Creatinine stable  Type 2 diabetes mellitus -Controlled, hemoglobin A1c was 6.6 in May -Metformin on hold, sliding scale insulin  Dyslipidemia -Continue Lipitor  History of SVT -On as needed metoprolol  Hypothyroidism -Continue Synthroid  History of recurrent UTIs -No symptoms at this time, monitor  DVT prophylaxis: Lovenox Code Status: Full code Family Communication: Discussed with patient, no family at bedside Disposition Plan: Home tomorrow with home health services Status is: Inpatient  Remains inpatient appropriate because: Severity of illness  Consultants:  Orthopedics  Procedures: Right total hip arthroplasty Dr. Ninfa Linden 11/27 Antimicrobials:    Subjective: -Feeling better today, thinks she will be able to go home tomorrow  Objective: Vitals:   08/31/21 0745 08/31/21 1503 08/31/21 1925 09/01/21 0700  BP: 107/73 115/61 (!) 127/54 (!) 112/47  Pulse: (!) 105 (!) 106 (!) 110 85  Resp: 17 16 18 19   Temp: 98.7 F (37.1 C) 98.4 F (36.9 C) 98.2 F (36.8 C) 98.8 F (37.1 C)  TempSrc: Oral Oral  Oral  SpO2: 92% 96% 100% 96%  Weight:      Height:        Intake/Output Summary (Last 24 hours) at  09/01/2021 1200 Last data filed at 08/31/2021 1300 Gross per 24 hour  Intake 120 ml  Output --  Net 120 ml   Filed Weights   08/29/21 1211 08/30/21 0402  Weight: 66.7 kg 69.9 kg    Examination:  Gen: Awake, Alert, Oriented X 3,  HEENT: no JVD Lungs: Good air movement bilaterally, CTAB CVS: S1S2/RRR Abd: soft, Non tender, non distended, BS present Extremities: Right hip with dressing, unremarkable    Data Reviewed:   CBC: Recent Labs  Lab 08/29/21 1213 08/30/21 0445 08/31/21 0357 09/01/21 0104  WBC 6.5 8.8 7.3 7.0  NEUTROABS 4.3  --   --   --   HGB 13.3 12.3 9.4* 8.9*  HCT 40.7 37.5 27.6* 27.6*  MCV 89.6 88.7 89.3 91.4  PLT 201 222 154 [id number]*   Basic Metabolic Panel: Recent Labs  Lab 08/29/21 1213 08/30/21 0445 08/31/21 0357  NA 138 135 137  K 3.9 3.9 3.7  CL 105 100 106  CO2 25 26 25   GLUCOSE 161* 188* 166*  BUN 19 18 13   CREATININE 1.08* 1.00 1.01*  CALCIUM 9.1 9.0 8.2*   GFR: Estimated Creatinine Clearance: 53.1 mL/min (A) (by C-G formula based on SCr of 1.01 mg/dL (H)). Liver Function Tests: No results for input(s): AST, ALT, ALKPHOS, BILITOT, PROT, ALBUMIN in the last 168 hours. No results for input(s): LIPASE, AMYLASE in the last 168 hours. No results for input(s): AMMONIA in the last 168 hours. Coagulation Profile: No results for input(s): INR, PROTIME in the last 168 hours. Cardiac Enzymes: No results for input(s): CKTOTAL, CKMB, CKMBINDEX, TROPONINI in the  last 168 hours. BNP (last 3 results) No results for input(s): PROBNP in the last 8760 hours. HbA1C: No results for input(s): HGBA1C in the last 72 hours. CBG: Recent Labs  Lab 08/31/21 0811 08/31/21 1138 08/31/21 1622 08/31/21 2305 09/01/21 0854  GLUCAP 166* 182* 166* 133* 141*   Lipid Profile: No results for input(s): CHOL, HDL, LDLCALC, TRIG, CHOLHDL, LDLDIRECT in the last 72 hours. Thyroid Function Tests: No results for input(s): TSH, T4TOTAL, FREET4, T3FREE, THYROIDAB in the  last 72 hours. Anemia Panel: No results for input(s): VITAMINB12, FOLATE, FERRITIN, TIBC, IRON, RETICCTPCT in the last 72 hours. Urine analysis:    Component Value Date/Time   BILIRUBINUR Negative 06/24/2021 1619   KETONESUR negative 02/05/2021 1459   PROTEINUR Negative 06/24/2021 1619   UROBILINOGEN 0.2 06/24/2021 1619   NITRITE Negative 06/24/2021 1619   LEUKOCYTESUR Small (1+) (A) 06/24/2021 1619   Sepsis Labs: @LABRCNTIP (procalcitonin:4,lacticidven:4)  ) Recent Results (from the past 240 hour(s))  Resp Panel by RT-PCR (Flu A&B, Covid) Nasopharyngeal Swab     Status: None   Collection Time: 08/29/21 12:12 PM   Specimen: Nasopharyngeal Swab; Nasopharyngeal(NP) swabs in vial transport medium  Result Value Ref Range Status   SARS Coronavirus 2 by RT PCR NEGATIVE NEGATIVE Final    Comment: (NOTE) SARS-CoV-2 target nucleic acids are NOT DETECTED.  The SARS-CoV-2 RNA is generally detectable in upper respiratory specimens during the acute phase of infection. The lowest concentration of SARS-CoV-2 viral copies this assay can detect is 138 copies/mL. A negative result does not preclude SARS-Cov-2 infection and should not be used as the sole basis for treatment or other patient management decisions. A negative result may occur with  improper specimen collection/handling, submission of specimen other than nasopharyngeal swab, presence of viral mutation(s) within the areas targeted by this assay, and inadequate number of viral copies(<138 copies/mL). A negative result must be combined with clinical observations, patient history, and epidemiological information. The expected result is Negative.  Fact Sheet for Patients:  08/31/21  Fact Sheet for Healthcare Providers:  BloggerCourse.com  This test is no t yet approved or cleared by the [employer].[place] FDA and  has been authorized for detection and/or diagnosis of SARS-CoV-2  by FDA under an Emergency Use Authorization (EUA). This EUA will remain  in effect (meaning this test can be used) for the duration of the COVID-19 declaration under Section 564(b)(1) of the Act, 21 U.S.C.section 360bbb-3(b)(1), unless the authorization is terminated  or revoked sooner.       Influenza A by PCR NEGATIVE NEGATIVE Final   Influenza B by PCR NEGATIVE NEGATIVE Final    Comment: (NOTE) The Xpert Xpress SARS-CoV-2/FLU/RSV plus assay is intended as an aid in the diagnosis of influenza from Nasopharyngeal swab specimens and should not be used as a sole basis for treatment. Nasal washings and aspirates are unacceptable for Xpert Xpress SARS-CoV-2/FLU/RSV testing.  Fact Sheet for Patients: [place]  Fact Sheet for Healthcare Providers: BloggerCourse.com  This test is not yet approved or cleared by the [employer]ious[employer]ker.[employer] FDA and has been authorized for detection and/or diagnosis of SARS-CoV-2 by FDA under an Emergency Use Authorization (EUA). This EUA will remain in effect (meaning this test can be used) for the duration of the COVID-19 declaration under Section 564(b)(1) of the Act, 21 U.S.C. section 360bbb-3(b)(1), unless the authorization is terminated or revoked.  Performed at [facility], [address] [place], [place]   Surgical pcr screen     Status: None  Collection Time: 08/30/21  5:29 AM   Specimen: Nasal Mucosa; Nasal Swab  Result Value Ref Range Status   MRSA, PCR NEGATIVE NEGATIVE Final   Staphylococcus aureus NEGATIVE NEGATIVE Final    Comment: (NOTE) The Xpert SA Assay (FDA approved for NASAL specimens in patients 26 years of age and older), is one component of a comprehensive surveillance program. It is not intended to diagnose infection nor to guide or monitor treatment. Performed at [facility], [place] [address], [place], [place] [address]          Radiology  Studies: No results found.      Scheduled Meds:  aspirin  81 mg Oral BID   atorvastatin  40 mg Oral QHS   Chlorhexidine Gluconate Cloth  6 each Topical Daily   cholecalciferol  1,000 Units Oral Daily   enoxaparin (LOVENOX) injection  40 mg Subcutaneous Q24H   feeding supplement (GLUCERNA SHAKE)  237 mL Oral TID BM   insulin aspart  0-9 Units Subcutaneous TID WC   levothyroxine  150 mcg Oral QAC breakfast   loratadine  10 mg Oral Daily   multivitamin with minerals  1 tablet Oral Daily   nortriptyline  50 mg Oral QHS   pantoprazole  40 mg Oral Daily   polyethylene glycol  17 g Oral Daily   senna-docusate  1 tablet Oral BID   sertraline  50 mg Oral Daily   trimethoprim  100 mg Oral QHS   Continuous Infusions:  methocarbamol (ROBAXIN) IV       LOS: 3 days    Time spent: 50min  Domenic Polite, MD [facility]   09/01/2021, 12:00 PM

## 2021-09-01 NOTE — Progress Notes (Signed)
Subjective: 2 Days Post-Op Procedure(s) (LRB): TOTAL HIP ARTHROPLASTY ANTERIOR APPROACH (Right) Patient reports pain as moderate.  No acute changes overall.  Working with PT.  Slight tachycardia and slight drop in H/H from yesterday.  Objective: Vital signs in last 24 hours: Temp:  [98.2 F (36.8 C)-98.7 F (37.1 C)] 98.2 F (36.8 C) (11/28 1925) Pulse Rate:  [105-110] 110 (11/28 1925) Resp:  [16-18] 18 (11/28 1925) BP: (107-127)/(54-73) 127/54 (11/28 1925) SpO2:  [92 %-100 %] 100 % (11/28 1925)  Intake/Output from previous day: 11/28 0701 - 11/29 0700 In: 1645.9 [P.O.:240; I.V.:1405.9] Out: -  Intake/Output this shift: No intake/output data recorded.  Recent Labs    08/29/21 1213 08/30/21 0445 08/31/21 0357 09/01/21 0104  HGB 13.3 12.3 9.4* 8.9*   Recent Labs    08/31/21 0357 09/01/21 0104  WBC 7.3 7.0  RBC 3.09* 3.02*  HCT 27.6* 27.6*  PLT 154 146*   Recent Labs    08/30/21 0445 08/31/21 0357  NA 135 137  K 3.9 3.7  CL 100 106  CO2 26 25  BUN 18 13  CREATININE 1.00 1.01*  GLUCOSE 188* 166*  CALCIUM 9.0 8.2*   No results for input(s): LABPT, INR in the last 72 hours.  Sensation intact distally Intact pulses distally Dorsiflexion/Plantar flexion intact Incision: dressing C/D/I   Assessment/Plan: 2 Days Post-Op Procedure(s) (LRB): TOTAL HIP ARTHROPLASTY ANTERIOR APPROACH (Right) Up with therapy Sounds like she will likely be able to progress to home once cleared by therapy and medically stable.     Kathryne Hitch 09/01/2021, 7:37 AM

## 2021-09-01 NOTE — Progress Notes (Signed)
Physical Therapy Treatment Patient Details Name: Bianca Phillips MRN: [id number] DOB: 03-27-1956 Today's Date: 09/01/2021   History of Present Illness Patient is a 65 y/o female who presents with hip fx s/p fall now s/p Rt THA, anterior approach 08/30/21. PMH includes DM, Covid-19.    PT Comments    Patient progressing well this afternoon. Session focused on bed mobility to simulate home without use of rails and HOB flat. Provided gait belt to use as leg lifter to get RLE into/out of bed independently. Performed family training on stairs and provided handout for using RW on stairs backwards. Pt will likely be ready to d/c home after PT sessions pending continued progress. Will follow.    Recommendations for follow up therapy are one component of a multi-disciplinary discharge planning process, led by the attending physician.  Recommendations may be updated based on patient status, additional functional criteria and insurance authorization.  Follow Up Recommendations  Follow physician's recommendations for discharge plan and follow up therapies     Assistance Recommended at Discharge Intermittent Supervision/Assistance  Equipment Recommendations  BSC/3in1    Recommendations for Other Services       Precautions / Restrictions Precautions Precautions: Fall Restrictions Weight Bearing Restrictions: Yes RLE Weight Bearing: Weight bearing as tolerated     Mobility  Bed Mobility Overal bed mobility: Needs Assistance Bed Mobility: Supine to Sit;Sit to Supine     Supine to sit: Min guard Sit to supine: Min guard   General bed mobility comments: Use of gait belt to bring RLE into/out of bed with cues for technique with HOB flat and no rails to simulate home. performed x2.    Transfers Overall transfer level: Needs assistance Equipment used: Rolling walker (2 wheels) Transfers: Sit to/from Stand;Bed to chair/wheelchair/BSC Sit to Stand: Supervision     Step pivot transfers:  Supervision     General transfer comment: Supervision for safety, Stood from chair x2, step transfer back to bed with use of RW.    Ambulation/Gait Ambulation/Gait assistance: Supervision Gait Distance (Feet): 100 Feet Assistive device: Rolling walker (2 wheels) Gait Pattern/deviations: Decreased step length - right;Decreased weight shift to right;Step-through pattern Gait velocity: decreased Gait velocity interpretation: 1.31 - 2.62 ft/sec, indicative of limited community ambulator   General Gait Details: Cues for knee flexion during stance phase and RW management. Seated rest break before/after stairs.   Stairs Stairs: Yes Stairs assistance: Min guard;Min assist Stair Management: Step to pattern;Backwards;Forwards;One rail Left;With walker Number of Stairs: 2 (x2 bouts) General stair comments: Performed backwards with therapist supporting RW and cues for technique with daughter present for training. Performed forwards with HHA and RW on other side for support, needs reminders for sequencing.   Wheelchair Mobility    Modified Rankin (Stroke Patients Only)       Balance Overall balance assessment: Needs assistance Sitting-balance support: Feet supported;No upper extremity supported Sitting balance-Leahy Scale: Good     Standing balance support: During functional activity Standing balance-Leahy Scale: Fair Standing balance comment: Requires UE support.                            Cognition Arousal/Alertness: Awake/alert Behavior During Therapy: Anxious Overall Cognitive Status: Within Functional Limits for tasks assessed                                 General Comments: Likely her baseline.  Exercises      General Comments General comments (skin integrity, edema, etc.): Daughter present during session.      Pertinent Vitals/Pain Pain Assessment: Faces Faces Pain Scale: Hurts even more Pain Location: RLE Pain Descriptors /  Indicators: Discomfort;Sore;Operative site guarding Pain Intervention(s): Monitored during session;Limited activity within patient's tolerance;Repositioned    Home Living                          Prior Function            PT Goals (current goals can now be found in the care plan section) Progress towards PT goals: Progressing toward goals    Frequency    7X/week      PT Plan Current plan remains appropriate    Co-evaluation              AM-PAC PT "6 Clicks" Mobility   Outcome Measure  Help needed turning from your back to your side while in a flat bed without using bedrails?: A Little Help needed moving from lying on your back to sitting on the side of a flat bed without using bedrails?: None Help needed moving to and from a bed to a chair (including a wheelchair)?: A Little Help needed standing up from a chair using your arms (e.g., wheelchair or bedside chair)?: A Little Help needed to walk in hospital room?: A Little Help needed climbing 3-5 steps with a railing? : A Little 6 Click Score: 19    End of Session Equipment Utilized During Treatment: Gait belt Activity Tolerance: Patient tolerated treatment well Patient left: in bed;with call bell/phone within reach;with family/visitor present Nurse Communication: Mobility status PT Visit Diagnosis: Pain;Difficulty in walking, not elsewhere classified (R26.2);Unsteadiness on feet (R26.81) Pain - Right/Left: Right Pain - part of body: Hip     Time: 2863-8177 PT Time Calculation (min) (ACUTE ONLY): 22 min  Charges:  $Gait Training: 8-22 mins                     Bianca Phillips, PT, DPT Acute Rehabilitation Services Pager [phone] Office [phone]      Bianca Phillips 09/01/2021, 3:16 PM

## 2021-09-01 NOTE — Progress Notes (Signed)
   09/01/21 1400  Mobility  Activity Refused mobility (Declined d/t just working with PT)

## 2021-09-01 NOTE — Progress Notes (Signed)
Pt up out of bed and into the chair this morning, Pain 6/10 Pain medication administered

## 2021-09-01 NOTE — Progress Notes (Signed)
Physical Therapy Treatment Patient Details Name: Bianca Phillips MRN: [id number] DOB: 1956/03/16 Today's Date: 09/01/2021   History of Present Illness Patient is a 65 y/o female who presents with hip fx s/p fall now s/p Rt THA, anterior approach 08/30/21. PMH includes DM, Covid-19.    PT Comments    Patient progressing well with mobility. Session focused on stair training- backwards with RW vs HHA. Requires more assist with HHA but prefers this technique better than backwards with RW due to comfort. Will perform family training on stairs next session per request. Improved ambulation distance today with supervision for safety. Will focus on bed mobility next session as well as progressive ambulation to prepare for d/c home. Will follow.    Recommendations for follow up therapy are one component of a multi-disciplinary discharge planning process, led by the attending physician.  Recommendations may be updated based on patient status, additional functional criteria and insurance authorization.  Follow Up Recommendations  Follow physician's recommendations for discharge plan and follow up therapies     Assistance Recommended at Discharge Intermittent Supervision/Assistance  Equipment Recommendations  BSC/3in1    Recommendations for Other Services       Precautions / Restrictions Precautions Precautions: Fall Restrictions Weight Bearing Restrictions: Yes RLE Weight Bearing: Weight bearing as tolerated     Mobility  Bed Mobility               General bed mobility comments: Sitting in chair upon PT arrival.    Transfers Overall transfer level: Needs assistance Equipment used: Rolling walker (2 wheels) Transfers: Sit to/from Stand Sit to Stand: Min guard           General transfer comment: Min guard for safety. Stood from chair x3, good demo of hand placement with gentle reminder for technique.    Ambulation/Gait Ambulation/Gait assistance: Supervision Gait  Distance (Feet): 150 Feet (+ 50') Assistive device: Rolling walker (2 wheels) Gait Pattern/deviations: Decreased step length - right;Decreased weight shift to right;Step-through pattern Gait velocity: decreased Gait velocity interpretation: 1.31 - 2.62 ft/sec, indicative of limited community ambulator   General Gait Details: Cues for sequencing with RW and for step through pattern with knee flexion during stance phase. 2 seated rest breaks pre and post stairs.   Stairs Stairs: Yes Stairs assistance: Mod assist;Min assist Stair Management: Step to pattern;Backwards;Forwards;One rail Left;With walker Number of Stairs: 2 (x4 times) General stair comments: Performed with HHA, Mod A to ascend/descend wiht pt wanting to grab rail even through she does not have one at home. Showed her how to use RW as rail and HHA. Second time perform backwards with RW needing MIn A to stabilize. Pt prefers HHA and going forwards. Will perform famil y training next session as requested.   Wheelchair Mobility    Modified Rankin (Stroke Patients Only)       Balance Overall balance assessment: Needs assistance Sitting-balance support: Feet supported;No upper extremity supported Sitting balance-Leahy Scale: Good     Standing balance support: During functional activity Standing balance-Leahy Scale: Fair                              Cognition Arousal/Alertness: Awake/alert Behavior During Therapy: Anxious Overall Cognitive Status: Impaired/Different from baseline Area of Impairment: Memory                     Memory: Decreased short-term memory         General Comments: Needs repetition  at times to follow commands or continue performing exercises.        Exercises Total Joint Exercises Quad Sets: AROM;Both;10 reps;Seated    General Comments        Pertinent Vitals/Pain Pain Score: 4  Pain Location: RLE Pain Descriptors / Indicators: Discomfort;Sore;Operative site  guarding Pain Intervention(s): Monitored during session;Premedicated before session;Repositioned    Home Living                          Prior Function            PT Goals (current goals can now be found in the care plan section) Progress towards PT goals: Progressing toward goals    Frequency    7X/week      PT Plan Current plan remains appropriate    Co-evaluation              AM-PAC PT "6 Clicks" Mobility   Outcome Measure  Help needed turning from your back to your side while in a flat bed without using bedrails?: A Little Help needed moving from lying on your back to sitting on the side of a flat bed without using bedrails?: A Little Help needed moving to and from a bed to a chair (including a wheelchair)?: A Little Help needed standing up from a chair using your arms (e.g., wheelchair or bedside chair)?: A Little Help needed to walk in hospital room?: A Little Help needed climbing 3-5 steps with a railing? : A Lot 6 Click Score: 17    End of Session Equipment Utilized During Treatment: Gait belt Activity Tolerance: Patient tolerated treatment well Patient left: in chair;with call bell/phone within reach Nurse Communication: Mobility status PT Visit Diagnosis: Pain;Difficulty in walking, not elsewhere classified (R26.2);Unsteadiness on feet (R26.81) Pain - Right/Left: Right Pain - part of body: Hip;Leg     Time: [id number] PT Time Calculation (min) (ACUTE ONLY): 26 min  Charges:  $Gait Training: 23-37 mins                     Marisa Severin, PT, DPT Acute Rehabilitation Services Pager [phone] Office [place] 09/01/2021, 10:12 AM

## 2021-09-02 DIAGNOSIS — N1832 Chronic kidney disease, stage 3b: Secondary | ICD-10-CM | POA: Diagnosis not present

## 2021-09-02 DIAGNOSIS — S72001A Fracture of unspecified part of neck of right femur, initial encounter for closed fracture: Secondary | ICD-10-CM | POA: Diagnosis not present

## 2021-09-02 DIAGNOSIS — S72001D Fracture of unspecified part of neck of right femur, subsequent encounter for closed fracture with routine healing: Secondary | ICD-10-CM | POA: Diagnosis not present

## 2021-09-02 LAB — BASIC METABOLIC PANEL
Anion gap: 3 — ABNORMAL LOW (ref 5–15)
BUN: 17 mg/dL (ref 8–23)
CO2: 29 mmol/L (ref 22–32)
Calcium: 8.2 mg/dL — ABNORMAL LOW (ref 8.9–10.3)
Chloride: 103 mmol/L (ref 98–111)
Creatinine, Ser: 0.96 mg/dL (ref 0.44–1.00)
GFR, Estimated: >60 mL/min (ref >60)
Glucose, Bld: 158 mg/dL — ABNORMAL HIGH (ref 70–99)
Potassium: 4.3 mmol/L (ref 3.5–5.1)
Sodium: 135 mmol/L (ref 135–145)

## 2021-09-02 LAB — CBC
HCT: 25.7 % — ABNORMAL LOW (ref 36.0–46.0)
Hemoglobin: 8.3 g/dL — ABNORMAL LOW (ref 12.0–15.0)
MCH: 29.3 pg (ref 26.0–34.0)
MCHC: 32.3 g/dL (ref 30.0–36.0)
MCV: 90.8 fL (ref 80.0–100.0)
Platelets: 172 10*3/uL (ref 150–400)
RBC: 2.83 MIL/uL — ABNORMAL LOW (ref 3.87–5.11)
RDW: 12.7 % (ref 11.5–15.5)
WBC: 6 10*3/uL (ref 4.0–10.5)
nRBC: 0 % (ref 0.0–0.2)

## 2021-09-02 LAB — GLUCOSE, CAPILLARY
Glucose-Capillary: 152 mg/dL — ABNORMAL HIGH (ref 70–99)
Glucose-Capillary: 214 mg/dL — ABNORMAL HIGH (ref 70–99)
Glucose-Capillary: 138 mg/dL — ABNORMAL HIGH (ref 70–99)

## 2021-09-02 MED ORDER — POLYETHYLENE GLYCOL 3350 17 G PO PACK: 17.0000 g | PACK | Freq: Every day | ORAL | 0 refills | Status: DC

## 2021-09-02 NOTE — Progress Notes (Signed)
Physical Therapy Treatment Patient Details Name: Bianca Phillips MRN: [id number] DOB: [birth year] Today's Date: 09/02/2021   History of Present Illness Patient is a 65 y/o female who presents with hip fx s/p fall now s/p Rt THA, anterior approach 08/30/21. PMH includes DM, Covid-19.    PT Comments    Pt was seen for mobility on RW with gait first covered to BR and then on the hall, then reviewed all techniques including RW for ascending stairs, then reviewed HEP.  Pt is attending to instructions but does require some repetition to manage stairs well.  Family was inserviced, and pt has a handout for this variation of stair management.  Gait belt use was reviewed, and then contacted case management and MD for follow up of her stay since pt is demonstrating ability to be assisted with family.  Follow along for acute PT goals as her stay permits.   Recommendations for follow up therapy are one component of a multi-disciplinary discharge planning process, led by the attending physician.  Recommendations may be updated based on patient status, additional functional criteria and insurance authorization.  Follow Up Recommendations  Follow physician's recommendations for discharge plan and follow up therapies     Assistance Recommended at Discharge Intermittent Supervision/Assistance  Equipment Recommendations  BSC/3in1    Recommendations for Other Services       Precautions / Restrictions Precautions Precautions: Fall Precaution Comments: pt has mild instability on RLE Restrictions Weight Bearing Restrictions: Yes RLE Weight Bearing: Weight bearing as tolerated     Mobility  Bed Mobility Overal bed mobility: Needs Assistance Bed Mobility: Supine to Sit;Sit to Supine     Supine to sit: Min guard Sit to supine: Min guard   General bed mobility comments: gave pt the belt and repeated instructions for moving RLE    Transfers Overall transfer level: Needs assistance Equipment used:  Rolling walker (2 wheels) Transfers: Sit to/from Stand Sit to Stand: Min guard           General transfer comment: min guard for safety and repetition of cues    Ambulation/Gait Ambulation/Gait assistance: Min guard Gait Distance (Feet): 110 Feet Assistive device: Rolling walker (2 wheels) Gait Pattern/deviations: Decreased stride length;Decreased step length - right (cues to avoid having walker so close to her at end of gait cycle) Gait velocity: decreased Gait velocity interpretation: <1.31 ft/sec, indicative of household ambulator Pre-gait activities: practiced with RW and sidesteps, practiced wgt shift on walker to manage comfort General Gait Details: reminders for walker sequence, to turn to L side and to set up with management of RLE   Stairs Stairs: Yes Stairs assistance: Min guard Stair Management: One rail Left;Two rails;Step to pattern;Forwards;Backwards;With walker Number of Stairs: 2 General stair comments: reviewed walker use, then per pt may have a rail installed.  Reviewed use of railing for gait on steps as well.   Wheelchair Mobility    Modified Rankin (Stroke Patients Only)       Balance Overall balance assessment: Needs assistance Sitting-balance support: Feet supported Sitting balance-Leahy Scale: Good     Standing balance support: Bilateral upper extremity supported;During functional activity Standing balance-Leahy Scale: Fair Standing balance comment: less than fair dynamically                            Cognition Arousal/Alertness: Awake/alert Behavior During Therapy: Anxious Overall Cognitive Status: Within Functional Limits for tasks assessed Area of Impairment: Awareness  Memory: Decreased short-term memory     Awareness: Intellectual   General Comments: minor repetition of cues for success        Exercises Total Joint Exercises Ankle Circles/Pumps: AAROM;5 reps Quad Sets: AROM;10  reps Gluteal Sets: AROM;10 reps    General Comments General comments (skin integrity, edema, etc.): Pt is up to walk with help and then stair training with help to avoid overstressing her R hip on RW,  and reviewed technique for one rail, two rails.      Pertinent Vitals/Pain Pain Assessment: Faces Faces Pain Scale: Hurts little more Pain Location: RLE Pain Descriptors / Indicators: Grimacing;Guarding Pain Intervention(s): Limited activity within patient's tolerance;Monitored during session;Premedicated before session;Repositioned;Ice applied    Home Living                          Prior Function            PT Goals (current goals can now be found in the care plan section) Acute Rehab PT Goals Patient Stated Goal: to get better Progress towards PT goals: Progressing toward goals    Frequency    7X/week      PT Plan Current plan remains appropriate    Co-evaluation              AM-PAC PT "6 Clicks" Mobility   Outcome Measure  Help needed turning from your back to your side while in a flat bed without using bedrails?: A Little Help needed moving from lying on your back to sitting on the side of a flat bed without using bedrails?: None Help needed moving to and from a bed to a chair (including a wheelchair)?: A Little Help needed standing up from a chair using your arms (e.g., wheelchair or bedside chair)?: A Little Help needed to walk in hospital room?: A Little Help needed climbing 3-5 steps with a railing? : A Little 6 Click Score: 19    End of Session Equipment Utilized During Treatment: Gait belt Activity Tolerance: Patient tolerated treatment well Patient left: in bed;with call bell/phone within reach;with bed alarm set (requested to return to bed as she was up this AM) Nurse Communication: Mobility status PT Visit Diagnosis: Pain;Difficulty in walking, not elsewhere classified (R26.2);Unsteadiness on feet (R26.81) Pain - Right/Left:  Right Pain - part of body: Hip     Time: 2706-2376 PT Time Calculation (min) (ACUTE ONLY): 38 min  Charges:  $Gait Training: 8-22 mins $Therapeutic Exercise: 8-22 mins $Therapeutic Activity: 8-22 mins                Ivar Drape 09/02/2021, 1:09 PM  Samul Dada, PT PhD Acute Rehab Dept. Number: [facility] [id number] and [facility] [phone]

## 2021-09-02 NOTE — TOC Transition Note (Signed)
Transition of Care [facility]) - CM/SW Discharge Note   Patient Details  Name: Bianca Phillips MRN: [id number] Date of Birth: 1956-05-31  Transition of [facility] CM/SW Contact:  Epifanio Lesches, RN Phone Number: 09/02/2021, 11:09 AM   Clinical Narrative:    Patient will DC to: daughter's residence Anticipated DC date: 09/02/2021 Family notified: yes Transport by: car        - s/p Rt THA, 08/30/21.  Per MD patient ready for DC today. RN, patient, patient's daughter/[name], and [name]/[facility] notified of DC.  Post hospital follow up noted on AVS. Pt without Rx med concerns. DME needs already delivered to pt and taken home by daughter. Daughter to provide transportation to home.  [name] (Daughter)        [phone]      RNCM will sign off for now as intervention is no longer needed. Please consult [place] again if new needs arise.     Final next level of care: Home w Home Health Services Barriers to Discharge: No Barriers Identified   Patient Goals and CMS Choice        Discharge Placement                       Discharge Plan and Services                  DME Agency: [employer] Date DME Agency Contacted: 08/31/21 Time DME Agency Contacted: 1441 Representative spoke with at DME Agency: [facility] Agency: [facility] ([facility] Date HH Agency Contacted: 08/31/21 Time HH Agency Contacted: 1511 Representative spoke with at [facility] Agency: [facility]  Social Determinants of Health (SDOH) Interventions     Readmission Risk Interventions No flowsheet data found.

## 2021-09-02 NOTE — Progress Notes (Signed)
   09/02/21 1434  Mobility  Activity Ambulated in room;Refused mobility (Patient declined further ambulation d/t getting discharged)  Range of Motion/Exercises Active;All extremities  Level of Assistance Standby assist, set-up cues, supervision of patient - no hands on  Assistive Device Front wheel walker  RLE Weight Bearing WBAT  Distance Ambulated (ft) 20 ft  Mobility Ambulated with assistance in room  Mobility Response Tolerated well  Mobility performed by Mobility specialist  Bed Position Chair       Patient in the process of getting ready to go home. Declined further mobility d/t discharge. Was left in recliner chair with all needs met and daughter present.

## 2021-09-03 DIAGNOSIS — W010XXD Fall on same level from slipping, tripping and stumbling without subsequent striking against object, subsequent encounter: Secondary | ICD-10-CM | POA: Diagnosis not present

## 2021-09-03 DIAGNOSIS — E1165 Type 2 diabetes mellitus with hyperglycemia: Secondary | ICD-10-CM | POA: Diagnosis not present

## 2021-09-03 DIAGNOSIS — M1990 Unspecified osteoarthritis, unspecified site: Secondary | ICD-10-CM | POA: Diagnosis not present

## 2021-09-03 DIAGNOSIS — K5900 Constipation, unspecified: Secondary | ICD-10-CM | POA: Diagnosis not present

## 2021-09-03 DIAGNOSIS — J302 Other seasonal allergic rhinitis: Secondary | ICD-10-CM | POA: Diagnosis not present

## 2021-09-03 DIAGNOSIS — E890 Postprocedural hypothyroidism: Secondary | ICD-10-CM | POA: Diagnosis not present

## 2021-09-03 DIAGNOSIS — E785 Hyperlipidemia, unspecified: Secondary | ICD-10-CM | POA: Diagnosis not present

## 2021-09-03 DIAGNOSIS — Z9181 History of falling: Secondary | ICD-10-CM | POA: Diagnosis not present

## 2021-09-03 DIAGNOSIS — S72031D Displaced midcervical fracture of right femur, subsequent encounter for closed fracture with routine healing: Secondary | ICD-10-CM | POA: Diagnosis not present

## 2021-09-03 DIAGNOSIS — Z96641 Presence of right artificial hip joint: Secondary | ICD-10-CM | POA: Diagnosis not present

## 2021-09-03 DIAGNOSIS — E1169 Type 2 diabetes mellitus with other specified complication: Secondary | ICD-10-CM | POA: Diagnosis not present

## 2021-09-03 DIAGNOSIS — F329 Major depressive disorder, single episode, unspecified: Secondary | ICD-10-CM | POA: Diagnosis not present

## 2021-09-03 DIAGNOSIS — I471 Supraventricular tachycardia: Secondary | ICD-10-CM | POA: Diagnosis not present

## 2021-09-03 DIAGNOSIS — F411 Generalized anxiety disorder: Secondary | ICD-10-CM | POA: Diagnosis not present

## 2021-09-03 DIAGNOSIS — N1832 Chronic kidney disease, stage 3b: Secondary | ICD-10-CM | POA: Diagnosis not present

## 2021-09-03 DIAGNOSIS — E1122 Type 2 diabetes mellitus with diabetic chronic kidney disease: Secondary | ICD-10-CM | POA: Diagnosis not present

## 2021-09-04 ENCOUNTER — Telehealth: Payer: Self-pay | Admitting: Orthopaedic Surgery

## 2021-09-04 NOTE — Telephone Encounter (Signed)
Called and verbal ok given.  

## 2021-09-04 NOTE — Telephone Encounter (Signed)
[name] (PT) from [facility] called requesting verbal orders. Asking home health pt for 2 wk 3. Please call [name] at [phone] if unable to answer leave detailed message on secure line.

## 2021-09-08 ENCOUNTER — Telehealth: Payer: Self-pay

## 2021-09-08 NOTE — Telephone Encounter (Signed)
LMOM of the below message for patient  

## 2021-09-08 NOTE — Telephone Encounter (Signed)
Pt's daughter Efraim Kaufmann call [phone]) and states that this pt is s/p surgery with CG on 08/30/21 total hip she was working with PT yesterday and coming down the steps heard and felt a "pop" states that she is able to bear weight but she is having some pain in her groin that is new. She has an appt on monday but did not know if she needed to come in sooner. Can call and lm on vm for daughter. She is a Engineer, civil (consulting) and if she does not answer can leve message with instructions.

## 2021-09-10 NOTE — Discharge Summary (Signed)
Physician Discharge Summary  Bianca Phillips [id number] DOB: [birth year] DOA: 08/29/2021  PCP: Orma Render, NP  Admit date: 08/29/2021 Discharge date: 09/02/2021  Time spent: 35 minutes  Recommendations for Outpatient Follow-up:  Orthopedics Dr. Ninfa Linden in 2 weeks, discontinue twice daily aspirin after 3 weeks PCP in 1 week, please review all imaging studies from this hospitalization, check CBC in 1 week   Discharge Diagnoses:  Principal Problem:   Closed right hip fracture ([place]) Postop blood loss anemia   Generalized anxiety disorder   Heart palpitations   Type 2 diabetes mellitus with hyperglycemia, without long-term current use of insulin (HCC)   Postoperative hypothyroidism   Hyperlipidemia associated with type 2 diabetes mellitus ([place])   Chronic kidney disease (CKD) stage G3b/A2, moderately decreased glomerular filtration rate (GFR) between 30-44 mL/min/1.73 square meter and albuminuria creatinine ratio between 30-299 mg/g ([place]) History of SVT Hypothyroidism History of recurrent UTIs  Discharge Condition: Stable  Diet recommendation: Diabetic  Filed Weights   08/29/21 1211 08/30/21 0402  Weight: 66.7 kg 69.9 kg    History of present illness:  65/F with history of type 2 diabetes mellitus, CKD 3, hypothyroidism, dyslipidemia, palpitations, anxiety presented to the ED following a mechanical fall with right hip pain In the emergency room she was noted to have an impacted right femoral neck fracture  Hospital Course:   Closed right hip fracture ([place]) -Orthopedics consulted,  underwent total right hip arthroplasty 11/27-Dr. Ninfa Linden -She was transitioned to aspirin twice daily by orthopedics for DVT prophylaxis -PT OT eval completed, home health PT was recommended and set up at discharge -Follow-up with Dr. Ninfa Linden in 2 weeks   Postop blood loss anemia -With a component of hemodilution, not symptomatic at this time -Check CBC in 1 week   CKD 3a -Creatinine  stable   Type 2 diabetes mellitus -Controlled, hemoglobin A1c was 6.6 in May -Metformin resumed   Dyslipidemia -Continue Lipitor   History of SVT -On as needed metoprolol   Hypothyroidism -Continue Synthroid   History of recurrent UTIs -No symptoms at this time, monitor    Discharge Exam: Vitals:   09/02/21 0420 09/02/21 0738  BP: (!) 111/52 139/82  Pulse: 87 92  Resp: 16 17  Temp: 99 F (37.2 C) 98 F (36.7 C)  SpO2: 93% 99%   Gen: Awake, Alert, Oriented X 3,  HEENT: no JVD Lungs: Good air movement bilaterally, CTAB CVS: S1S2/RRR Abd: soft, Non tender, non distended, BS present Extremities: Right hip with dressing, unremarkable   Discharge Instructions   Discharge Instructions     Diet - low sodium heart healthy   Complete by: As directed    Diet Carb Modified   Complete by: As directed    Discharge wound care:   Complete by: As directed    routine   Increase activity slowly   Complete by: As directed       Allergies as of 09/02/2021       Reactions   Curare [tubocurarine] Other (See Comments)   Per pt was awake and aware that she had paralysis and could not breath        Medication List     STOP taking these medications    azithromycin 250 MG tablet Commonly known as: ZITHROMAX       TAKE these medications    Accu-Chek Aviva Plus test strip Generic drug: glucose blood USE TO CHECK BLOOD SUGAR EVERY DAY   Alpha-Lipoic Acid 600 MG Tabs Take 1 tablet by mouth every  evening.   ALPRAZolam 0.25 MG tablet Commonly known as: XANAX Take 1 tablet (0.25 mg total) by mouth at bedtime as needed for anxiety. Do not take with pain medication or other medication that causes drowsiness.   aspirin 81 MG chewable tablet Chew 1 tablet (81 mg total) by mouth 2 (two) times daily. What changed: when to take this   atorvastatin 40 MG tablet Commonly known as: LIPITOR Take 1 tablet (40 mg total) by mouth at bedtime.   cetirizine 10 MG  tablet Commonly known as: ZYRTEC Take 10 mg by mouth daily.   cholecalciferol 25 MCG (1000 UNIT) tablet Commonly known as: VITAMIN D3 Take 1,000 Units by mouth daily.   docusate sodium 100 MG capsule Commonly known as: COLACE Take 200 mg by mouth daily.   fluticasone 50 MCG/ACT nasal spray Commonly known as: FLONASE Place 1 spray into both nostrils daily as needed.   GLUCOSAMINE CHOND CMP ADVANCED PO Take 2 capsules by mouth every evening.   HYDROcodone-acetaminophen 5-325 MG tablet Commonly known as: NORCO/VICODIN Take 1-2 tablets by mouth every 6 (six) hours as needed for moderate pain.   levothyroxine 150 MCG tablet Commonly known as: SYNTHROID Take 1 tablet (150 mcg total) by mouth daily before breakfast.   meclizine 12.5 MG tablet Commonly known as: ANTIVERT Take 12.5 mg by mouth every 6 (six) hours as needed for dizziness.   metFORMIN 500 MG tablet Commonly known as: GLUCOPHAGE Take 1.5 tablets (750 mg total) by mouth daily with breakfast AND 1 tablet (500 mg total) every evening.   methocarbamol 500 MG tablet Commonly known as: ROBAXIN Take 1 tablet (500 mg total) by mouth every 6 (six) hours as needed for muscle spasms.   metoprolol tartrate 25 MG tablet Commonly known as: LOPRESSOR Take 1 tablet (25 mg total) by mouth 2 (two) times daily as needed (palpitations).   nortriptyline 50 MG capsule Commonly known as: PAMELOR TAKE 1 CAPSULE BY MOUTH EVERY NIGHT AT BEDTIME. DISCONTINUE AMITRIPTYLINE What changed: See the new instructions.   OMEGA-3 FISH OIL PO Take 2,000 capsules by mouth every evening.   polyethylene glycol 17 g packet Commonly known as: MiraLax Take 17 g by mouth daily.   Probiotic Daily Caps Take 1 capsule by mouth every evening.   sertraline 50 MG tablet Commonly known as: ZOLOFT Take 1 tablet (50 mg total) by mouth daily.   trimethoprim 100 MG tablet Commonly known as: TRIMPEX Take 100 mg by mouth at bedtime.   Turmeric 500 MG  Caps Take 1 capsule by mouth every evening.   vitamin C 1000 MG tablet Take 1,000 mg by mouth daily.               Discharge Care Instructions  (From admission, onward)           Start     Ordered   09/02/21 0000  Discharge wound care:       Comments: routine   09/02/21 1207           Allergies  Allergen Reactions   Curare [Tubocurarine] Other (See Comments)    Per pt was awake and aware that she had paralysis and could not breath    Follow-up Information     Mcarthur Rossetti, MD. Schedule an appointment as soon as possible for a visit in 2 week(s).   Specialty: Orthopedic Surgery Contact information: [place] [id number] [address] [facility] up.  Why: Your home health services will be provided by [facility], start of within48 hours post discharge Questions: [phone]                 The results of significant diagnostics from this hospitalization (including imaging, microbiology, ancillary and laboratory) are listed below for reference.    Significant Diagnostic Studies: DG Pelvis Portable  Result Date: 08/30/2021 CLINICAL DATA:  Status post total replacement of right hip. EXAM: PORTABLE PELVIS 1-2 VIEWS COMPARISON:  Hip radiograph 08/29/2021 FINDINGS: Interval placement of right total hip arthroplasty. Prosthesis is intact and in appropriate position. Small amount of subcutaneous emphysema noted at the right hip joint, consistent with recent surgery. Surgical skin staples overlie the right hip. No pelvic fracture. 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 IMPRESSION: Interval postoperative changes of right total hip arthroplasty. Electronically Signed   By: Sherron Ales M.D.   On: 08/30/2021 14:08   DG Chest Port 1 View  Result Date: 08/29/2021 CLINICAL DATA:  Pre-op. EXAM: PORTABLE CHEST 1 VIEW COMPARISON:  None. FINDINGS: The heart size and mediastinal contours are within  normal limits. Both lungs are clear. No pleural effusion or pneumothorax. The visualized skeletal structures are unremarkable. IMPRESSION: No active disease. Electronically Signed   By: Sherron Ales M.D.   On: 08/29/2021 12:43   DG C-Arm 1-60 Min-No Report  Result Date: 08/30/2021 CLINICAL DATA:  Right total hip replacement. EXAM: OPERATIVE RIGHT HIP (WITH PELVIS IF PERFORMED) 10 VIEWS TECHNIQUE: Fluoroscopic spot image(s) were submitted for interpretation post-operatively. COMPARISON:  Right hip radiographs-08/29/2021 FINDINGS: Ten spot fluoroscopic images of the lower pelvis and right hip are provided for review and demonstrate the sequela right-sided bipolar hip replacement a dressing previously noted midcervical femoral neck fracture. Alignment appears anatomic given AP projection. There is a minimal amount of subcutaneous emphysema about the operative site. No radiopaque foreign body. IMPRESSION: Post bipolar right total hip replacement without evidence of complication. Electronically Signed   By: Simonne Come M.D.   On: 08/30/2021 09:50   DG C-Arm 1-60 Min-No Report  Result Date: 08/30/2021 CLINICAL DATA:  Right total hip replacement. EXAM: OPERATIVE RIGHT HIP (WITH PELVIS IF PERFORMED) 10 VIEWS TECHNIQUE: Fluoroscopic spot image(s) were submitted for interpretation post-operatively. COMPARISON:  Right hip radiographs-08/29/2021 FINDINGS: Ten spot fluoroscopic images of the lower pelvis and right hip are provided for review and demonstrate the sequela right-sided bipolar hip replacement a dressing previously noted midcervical femoral neck fracture. Alignment appears anatomic given AP projection. There is a minimal amount of subcutaneous emphysema about the operative site. No radiopaque foreign body. IMPRESSION: Post bipolar right total hip replacement without evidence of complication. Electronically Signed   By: Simonne Come M.D.   On: 08/30/2021 09:50   DG HIP OPERATIVE UNILAT WITH PELVIS  RIGHT  Result Date: 08/30/2021 CLINICAL DATA:  Right total hip replacement. EXAM: OPERATIVE RIGHT HIP (WITH PELVIS IF PERFORMED) 10 VIEWS TECHNIQUE: Fluoroscopic spot image(s) were submitted for interpretation post-operatively. COMPARISON:  Right hip radiographs-08/29/2021 FINDINGS: Ten spot fluoroscopic images of the lower pelvis and right hip are provided for review and demonstrate the sequela right-sided bipolar hip replacement a dressing previously noted midcervical femoral neck fracture. Alignment appears anatomic given AP projection. There is a minimal amount of subcutaneous emphysema about the operative site. No radiopaque foreign body. IMPRESSION: Post bipolar right total hip replacement without evidence of complication. Electronically Signed   By: Simonne Come M.D.   On: 08/30/2021 09:50   DG Hip Unilat W or Wo  Pelvis 2-3 Views Right  Result Date: 08/29/2021 CLINICAL DATA:  [place] EXAM: DG HIP (WITH OR WITHOUT PELVIS) 2-3V RIGHT COMPARISON:  07/09/2021 FINDINGS: Impacted right mid cervical femoral neck fracture. No dislocation. Bony pelvis intact. IMPRESSION: 1. Impacted mid cervical right femoral neck fracture. Electronically Signed   By: Lucrezia Europe M.D.   On: 08/29/2021 12:10    Microbiology: No results found for this or any previous visit (from the past 240 hour(s)).   Labs: Basic Metabolic Panel: No results for input(s): NA, K, CL, CO2, GLUCOSE, BUN, CREATININE, CALCIUM, MG, PHOS in the last 168 hours. Liver Function Tests: No results for input(s): AST, ALT, ALKPHOS, BILITOT, PROT, ALBUMIN in the last 168 hours. No results for input(s): LIPASE, AMYLASE in the last 168 hours. No results for input(s): AMMONIA in the last 168 hours. CBC: No results for input(s): WBC, NEUTROABS, HGB, HCT, MCV, PLT in the last 168 hours. Cardiac Enzymes: No results for input(s): CKTOTAL, CKMB, CKMBINDEX, TROPONINI in the last 168 hours. BNP: BNP (last 3 results) No results for input(s): BNP in the last  8760 hours.  ProBNP (last 3 results) No results for input(s): PROBNP in the last 8760 hours.  CBG: No results for input(s): GLUCAP in the last 168 hours.     Signed:  Domenic Polite MD.  [facility] 09/10/2021, 3:15 PM

## 2021-09-14 ENCOUNTER — Ambulatory Visit (INDEPENDENT_AMBULATORY_CARE_PROVIDER_SITE_OTHER): Payer: Medicare Other | Admitting: Physician Assistant

## 2021-09-14 ENCOUNTER — Encounter: Payer: Self-pay | Admitting: Physician Assistant

## 2021-09-14 ENCOUNTER — Other Ambulatory Visit: Payer: Self-pay

## 2021-09-14 DIAGNOSIS — Z96641 Presence of right artificial hip joint: Secondary | ICD-10-CM

## 2021-09-14 NOTE — Progress Notes (Signed)
HPI: [name] returns today status post right total hip arthroplasty 08/30/2021 secondary to right displaced femoral neck fracture.  She states that overall she is doing well ranks her pain to be at worst 2-3 out of 10 pain.  She has been taking aspirin twice daily.  She was on an 81 mg aspirin prior to surgery.  No shortness of breath chest pain.  Did have a burning sensation in the hip last week.  But overall is trending towards improvement taking hydrocodone in the donut night and taking ibuprofen during the day.  Physical exam: General well-developed well-nourished female no acute distress ambulating with a rolling walker. Right hip surgical incisions healing well no signs of infection.  Skin is well approximated with staples.  No seroma.  Right calf supple nontender.  Passive range of motion of the hip reveals fluid motion.  Dorsiflexion plantarflexion right ankle intact.  Impression: Right femoral neck fracture Status post right total hip arthroplasty  Plan: Discussed with her that she should go back on her 81 mg aspirin daily.  Would prefer that she takes just Tylenol instead of ibuprofen due to the fact that she is on aspirin.  She can continue her hydrocodone as needed.  Activities as tolerated.  Staples removed Steri-Strips applied.  Scar tissue mobilization discussed.  No submerging the incision for at least 6 weeks.  See her back in 4 weeks see how she is doing overall.  Questions were encouraged and answered.

## 2021-09-15 ENCOUNTER — Telehealth: Payer: Self-pay | Admitting: Orthopaedic Surgery

## 2021-09-15 ENCOUNTER — Other Ambulatory Visit: Payer: Self-pay | Admitting: Orthopaedic Surgery

## 2021-09-15 MED ORDER — HYDROCODONE-ACETAMINOPHEN 5-325 MG PO TABS: 1.0000 | ORAL_TABLET | Freq: Four times a day (QID) | ORAL | 0 refills | Status: DC | PRN

## 2021-09-15 NOTE — Telephone Encounter (Signed)
Patient called needing Rx refilled Hydrocodone. The number to contact patient is [phone]

## 2021-09-17 ENCOUNTER — Ambulatory Visit: Payer: Medicare Other

## 2021-10-09 ENCOUNTER — Telehealth: Payer: Self-pay

## 2021-10-09 NOTE — Telephone Encounter (Signed)
Patient called stating that she has been having a strange feeling.  Stated that she has not taken her Hydrocodone, but when she was, it was just 1 a day and just some Tylenol.  Would like a call back to discuss?  Cb# [phone].  Please advise.  Thank you.

## 2021-10-12 ENCOUNTER — Ambulatory Visit (INDEPENDENT_AMBULATORY_CARE_PROVIDER_SITE_OTHER): Payer: Medicare Other | Admitting: Orthopaedic Surgery

## 2021-10-12 ENCOUNTER — Encounter: Payer: Self-pay | Admitting: Orthopaedic Surgery

## 2021-10-12 ENCOUNTER — Other Ambulatory Visit: Payer: Self-pay

## 2021-10-12 DIAGNOSIS — Z96641 Presence of right artificial hip joint: Secondary | ICD-10-CM

## 2021-10-12 NOTE — Progress Notes (Signed)
The patient is 6 weeks status post a right total hip arthroplasty to treat her right hip fracture.  She is a very active 66 year old female.  She states she came off the cane on Monday a week ago and has been walking without assistive ice.  She reports good range of motion and strength with her hip.  She is just taking ibuprofen and sleep medication at night.  On exam her right operative hip moves smoothly and fluidly with no blocks to rotation.  Her leg lengths appear equal.  She is walking without a limp.  At this point she will continue to increase her activities as comfort allows.  There is no significant restrictions other than running.  The next time I need to see her back as not for 6 months.  I can see her sooner if there is any issues.  At the next visit we will have a standing low AP pelvis and lateral of her right operative hip.  All questions and concerns were answered and addressed.

## 2021-10-12 NOTE — Telephone Encounter (Signed)
Patient has an appt today.

## 2021-10-15 ENCOUNTER — Ambulatory Visit
Admission: RE | Admit: 2021-10-15 | Discharge: 2021-10-15 | Disposition: A | Discharge status: Home / Self Care | Payer: Medicare Other | Source: Ambulatory Visit | Attending: Nurse Practitioner | Admitting: Nurse Practitioner

## 2021-10-15 ENCOUNTER — Other Ambulatory Visit: Payer: Self-pay

## 2021-10-15 DIAGNOSIS — Z1231 Encounter for screening mammogram for malignant neoplasm of breast: Secondary | ICD-10-CM

## 2021-10-17 ENCOUNTER — Other Ambulatory Visit (HOSPITAL_BASED_OUTPATIENT_CLINIC_OR_DEPARTMENT_OTHER): Payer: Self-pay | Admitting: Nurse Practitioner

## 2021-10-22 ENCOUNTER — Telehealth: Payer: Self-pay | Admitting: Orthopaedic Surgery

## 2021-10-22 NOTE — Telephone Encounter (Signed)
Pt called and is having pain in her groin that her hip replacement was done on.   CB [phone]

## 2021-10-27 ENCOUNTER — Other Ambulatory Visit: Payer: Self-pay

## 2021-10-27 ENCOUNTER — Ambulatory Visit (INDEPENDENT_AMBULATORY_CARE_PROVIDER_SITE_OTHER): Payer: Medicare Other | Admitting: Nurse Practitioner

## 2021-10-27 ENCOUNTER — Encounter (HOSPITAL_BASED_OUTPATIENT_CLINIC_OR_DEPARTMENT_OTHER): Payer: Self-pay | Admitting: Nurse Practitioner

## 2021-10-27 DIAGNOSIS — G4709 Other insomnia: Secondary | ICD-10-CM | POA: Diagnosis not present

## 2021-10-27 MED ORDER — HYDROXYZINE PAMOATE 50 MG PO CAPS: 50.0000 mg | ORAL_CAPSULE | Freq: Every evening | ORAL | 3 refills | Status: DC | PRN

## 2021-10-27 NOTE — Assessment & Plan Note (Signed)
Continued symptoms of insomnia despite proper sleep hygiene and bedtime routine.  Has tried and failed melatonin and OTC sleep aids Symptoms consistent with increased anxious thoughts at bedtime.  Discussed option to trial hydroxyzine for management to see if this is helpful for anxiety and sleep.  Instructions for use and follow-up discussed  She will let me know if this is not effective and we can explore alternatives.

## 2021-10-27 NOTE — Progress Notes (Signed)
Virtual Visit Encounter mychart visit.   I connected with  Britt Boozer on 10/27/21 at  3:50 PM EST by secure audio and/or video enabled telemedicine application. I verified that I am speaking with the correct person using two identifiers.   I introduced myself as a Designer, jewellery with the practice. The limitations of evaluation and management by telemedicine discussed with the patient and the availability of in person appointments. The patient expressed verbal understanding and consent to proceed.  Participating parties in this visit include: Myself and patient  The patient is: Patient Location: Home I am: Provider Location: Home Office Subjective:    CC and HPI: Bianca Phillips is a 66 y.o. year old female presenting for new evaluation and treatment of insomnia. Lariana tells me that she has been having difficulty with falling asleep at night for several months now and she feels this is not improving. She endorses going to be around 11 pm and she will read or watch the news then try to go to sleep by midnight. She reports that some nights she is unable to fall asleep and finds herself laying there until 4 or 5 am. She reports that she does not nap during the day and does not have caffeine late at night. She has tried melatonin and this was not helpful for her. She does have a history of anxiety and depression symptoms and is managed with sertraline during the day. She endorses that an anxious mind could be contributing to her difficulty sleeping.   Past medical history, Surgical history, Family history not pertinant except as noted below, Social history, Allergies, and medications have been entered into the medical record, reviewed, and corrections made.   Review of Systems:  All review of systems negative except what is listed in the HPI  Objective:    Alert and oriented x 4 Speaking in clear sentences with no shortness of breath. No distress.  Impression and Recommendations:     Problem List Items Addressed This Visit     Insomnia - Primary    Continued symptoms of insomnia despite proper sleep hygiene and bedtime routine.  Has tried and failed melatonin and OTC sleep aids Symptoms consistent with increased anxious thoughts at bedtime.  Discussed option to trial hydroxyzine for management to see if this is helpful for anxiety and sleep.  Instructions for use and follow-up discussed  She will let me know if this is not effective and we can explore alternatives.       Relevant Medications   hydrOXYzine (VISTARIL) 50 MG capsule    orders and follow up as documented in EMR I discussed the assessment and treatment plan with the patient. The patient was provided an opportunity to ask questions and all were answered. The patient agreed with the plan and demonstrated an understanding of the instructions.   The patient was advised to call back or seek an in-person evaluation if the symptoms worsen or if the condition fails to improve as anticipated.  Follow-Up: prn  I provided 16 minutes of non-face-to-face interaction with this non face-to-face encounter including intake, same-day documentation, and chart review.   Orma Render, NP , DNP, AGNP-c [place] [facility] [phone] (fax)

## 2021-10-27 NOTE — Patient Instructions (Signed)
Let me know if the new medication is not helpful.

## 2021-10-28 ENCOUNTER — Encounter: Payer: Self-pay | Admitting: Physician Assistant

## 2021-10-28 ENCOUNTER — Ambulatory Visit (INDEPENDENT_AMBULATORY_CARE_PROVIDER_SITE_OTHER): Payer: Medicare Other

## 2021-10-28 ENCOUNTER — Other Ambulatory Visit (HOSPITAL_BASED_OUTPATIENT_CLINIC_OR_DEPARTMENT_OTHER): Payer: Self-pay | Admitting: Nurse Practitioner

## 2021-10-28 ENCOUNTER — Ambulatory Visit (INDEPENDENT_AMBULATORY_CARE_PROVIDER_SITE_OTHER): Payer: Medicare Other | Admitting: Physician Assistant

## 2021-10-28 DIAGNOSIS — Z96641 Presence of right artificial hip joint: Secondary | ICD-10-CM

## 2021-10-28 NOTE — Progress Notes (Signed)
HPI: Patient returns now just over 2 months status post right total hip arthroplasty secondary to hip fracture.  She is very active and has been back to working out even doing exercise in the water.  She has had some pain in her hip which is better today she just wants x-rays to make sure everything is okay.  Review of systems: See HPI otherwise negative  Physical exam: General well-developed well-nourished female no acute distress ambulates without any assistive device Right hip: Surgical incisions well-healed no signs of infection.  Excellent range of motion of the hip without pain.  Radiographs: AP pelvis lateral view of the right hip showed no acute fractures.  Status post right total hip arthroplasty with well-seated components no complicating features.  Both hips are well located.  Impression: Status post right total hip arthroplasty 08/30/2021  Plan: She will avoid abduction exercises which she has been doing pool.  Follow-up with [place] as planned in 6 months sooner if there is any questions concerns.  Questions were encouraged and answered.  IT band stretching exercises shown.

## 2021-12-18 ENCOUNTER — Other Ambulatory Visit (HOSPITAL_BASED_OUTPATIENT_CLINIC_OR_DEPARTMENT_OTHER): Payer: Self-pay | Admitting: Nurse Practitioner

## 2021-12-18 DIAGNOSIS — E782 Mixed hyperlipidemia: Secondary | ICD-10-CM

## 2021-12-18 DIAGNOSIS — E1165 Type 2 diabetes mellitus with hyperglycemia: Secondary | ICD-10-CM

## 2022-01-05 ENCOUNTER — Other Ambulatory Visit (HOSPITAL_BASED_OUTPATIENT_CLINIC_OR_DEPARTMENT_OTHER): Payer: Self-pay | Admitting: Nurse Practitioner

## 2022-01-05 DIAGNOSIS — F320 Major depressive disorder, single episode, mild: Secondary | ICD-10-CM

## 2022-01-28 ENCOUNTER — Ambulatory Visit (INDEPENDENT_AMBULATORY_CARE_PROVIDER_SITE_OTHER): Payer: Medicare Other | Admitting: Nurse Practitioner

## 2022-01-28 ENCOUNTER — Encounter (HOSPITAL_BASED_OUTPATIENT_CLINIC_OR_DEPARTMENT_OTHER): Payer: Self-pay | Admitting: Nurse Practitioner

## 2022-01-28 VITALS — BP 117/72 | Temp 97.0°F | Ht 65.0 in | Wt 146.2 lb

## 2022-01-28 DIAGNOSIS — E559 Vitamin D deficiency, unspecified: Secondary | ICD-10-CM

## 2022-01-28 DIAGNOSIS — E890 Postprocedural hypothyroidism: Secondary | ICD-10-CM | POA: Diagnosis not present

## 2022-01-28 DIAGNOSIS — E1169 Type 2 diabetes mellitus with other specified complication: Secondary | ICD-10-CM | POA: Diagnosis not present

## 2022-01-28 DIAGNOSIS — R002 Palpitations: Secondary | ICD-10-CM

## 2022-01-28 DIAGNOSIS — J301 Allergic rhinitis due to pollen: Secondary | ICD-10-CM

## 2022-01-28 DIAGNOSIS — N1832 Chronic kidney disease, stage 3b: Secondary | ICD-10-CM | POA: Diagnosis not present

## 2022-01-28 DIAGNOSIS — R000 Tachycardia, unspecified: Secondary | ICD-10-CM

## 2022-01-28 DIAGNOSIS — E1165 Type 2 diabetes mellitus with hyperglycemia: Secondary | ICD-10-CM

## 2022-01-28 DIAGNOSIS — N898 Other specified noninflammatory disorders of vagina: Secondary | ICD-10-CM

## 2022-01-28 DIAGNOSIS — E785 Hyperlipidemia, unspecified: Secondary | ICD-10-CM

## 2022-01-28 MED ORDER — FLUCONAZOLE 150 MG PO TABS: 150.0000 mg | ORAL_TABLET | Freq: Once | ORAL | 1 refills | Status: AC

## 2022-01-28 MED ORDER — MOMETASONE FUROATE 50 MCG/ACT NA SUSP: NASAL | 6 refills | Status: DC

## 2022-01-28 NOTE — Patient Instructions (Signed)
It was a pleasure seeing you today. I hope your time spent with [place] was pleasant and helpful. Please let us know if there is anything we can do to improve the service you receive.  ? ?Today we discussed concerns with: ? ?Type 2 diabetes mellitus with hyperglycemia, without long-term current use of insulin ([place]) ? ?Postoperative hypothyroidism ? ?Hyperlipidemia associated with type 2 diabetes mellitus ([place]) ? ?Chronic kidney disease (CKD) stage G3b/A2, moderately decreased glomerular filtration rate (GFR) between 30-44 mL/min/1.73 square meter and albuminuria creatinine ratio between 30-299 mg/g (HCC) ? ?Tachycardia ? ?Itching in the vaginal area ? ?Seasonal allergic rhinitis due to pollen ? ?Heart palpitations ? ? ?The following orders have been placed for you today: ? ?Orders Placed This Encounter  ?Procedures  ? CBC with Differential  ? Comprehensive metabolic panel  ? Lipid Profile  ? TSH  ? HgB A1c  ? B12 and Folate Panel  ? Vitamin B12  ? Vitamin D 1,25 dihydroxy  ? ? ? ?Important Office Information ?Lab Results ?If labs were ordered, please note that you will see results through [facility] as soon as they come available from [facility].  ?It takes up to 5 business days for the results to be routed to me and for me to review them once all of the lab results have come through from [facility]. I will make recommendations based on your results and send these through [place] or someone from the office will call you to discuss. If your labs are abnormal, we may contact you to schedule a visit to discuss the results and make recommendations.  ?If you have not heard from [place] within 5 business days or you have waited longer than a week and your lab results have not come through on [place], please feel free to call the office or send a message through [place] to follow-up on these labs.  ? ?Referrals ?If referrals were placed today, the office where the referral was sent will contact you either by phone or through [place] to set  up scheduling. Please note that it can take up to a week for the referral office to contact you. If you do not hear from them in a week, please contact the referral office directly to inquire about scheduling.  ? ?Condition Treated ?If your condition worsens or you begin to have new symptoms, please schedule a follow-up appointment for further evaluation. If you are not sure if an appointment is needed, you may call the office to leave a message for the nurse and someone will contact you with recommendations.  ?If you have an urgent or life threatening emergency, please do not call the office, but seek emergency evaluation by calling 911 or going to the nearest emergency room for evaluation.  ? ?MyChart and Phone Calls ?Please do not use MyChart for urgent messages. It may take up to 3 business days for MyChart messages to be read by staff and if they are unable to handle the request, an additional 3 business days for them to be routed to me and for my response.  ?Messages sent to the provider through [place] do not come directly to the provider, please allow time for these messages to be routed and for me to respond.  ?We get a large volume of MyChart messages daily and these are responded to in the order received.  ? ?For urgent messages, please call the office at [phone] and speak with the front office staff or leave a message on the line of  my assistant for guidance.  ?We are seeing patients from the hours of 8:00 am through 5:00 pm and calls directly to the nurse may not be answered immediately due to seeing patients, but your call will be returned as soon as possible.  ?Phone  messages received after 4:00 PM Monday through Thursday may not be returned until the following business day. Phone messages received after 11:00 AM on Friday may not be returned until Monday.  ? ?After Hours ?We share on call hours with providers from other offices. If you have an urgent need after hours that cannot wait until the  next business day, please contact the on call provider by calling the office number. A nurse will speak with you and contact the provider if needed for recommendations.  ?If you have an urgent or life threatening emergency after hours, please do not call the on call provider, but seek emergency evaluation by calling 911 or going to the nearest emergency room for evaluation.  ? ?Paperwork ?All paperwork requires a minimum of 5 days to complete and return to you or the designated personnel. Please keep this in mind when bringing in forms or sending requests for paperwork completion to the office.  ?  ?

## 2022-01-29 ENCOUNTER — Ambulatory Visit (HOSPITAL_BASED_OUTPATIENT_CLINIC_OR_DEPARTMENT_OTHER): Payer: Medicare Other

## 2022-01-29 DIAGNOSIS — E1165 Type 2 diabetes mellitus with hyperglycemia: Secondary | ICD-10-CM | POA: Diagnosis not present

## 2022-01-29 DIAGNOSIS — E890 Postprocedural hypothyroidism: Secondary | ICD-10-CM | POA: Diagnosis not present

## 2022-01-29 DIAGNOSIS — N1832 Chronic kidney disease, stage 3b: Secondary | ICD-10-CM | POA: Diagnosis not present

## 2022-01-29 DIAGNOSIS — E785 Hyperlipidemia, unspecified: Secondary | ICD-10-CM | POA: Diagnosis not present

## 2022-01-29 DIAGNOSIS — E1169 Type 2 diabetes mellitus with other specified complication: Secondary | ICD-10-CM | POA: Diagnosis not present

## 2022-01-30 LAB — RENAL FUNCTION PANEL
Albumin: 4.5 g/dL (ref 3.8–4.8)
BUN/Creatinine Ratio: 19 (ref 12–28)
BUN: 22 mg/dL (ref 8–27)
CO2: 24 mmol/L (ref 20–29)
Calcium: 9.6 mg/dL (ref 8.7–10.3)
Chloride: 102 mmol/L (ref 96–106)
Creatinine, Ser: 1.17 mg/dL — ABNORMAL HIGH (ref 0.57–1.00)
Glucose: 156 mg/dL — ABNORMAL HIGH (ref 70–99)
Phosphorus: 3.5 mg/dL (ref 3.0–4.3)
Potassium: 4.5 mmol/L (ref 3.5–5.2)
Sodium: 141 mmol/L (ref 134–144)
eGFR: 51 mL/min/{1.73_m2} — ABNORMAL LOW (ref >59)

## 2022-02-07 LAB — COMPREHENSIVE METABOLIC PANEL
ALT: 22 IU/L (ref 0–32)
AST: 20 IU/L (ref 0–40)
Albumin/Globulin Ratio: 2.1 (ref 1.2–2.2)
Albumin: 4.5 g/dL (ref 3.8–4.8)
Alkaline Phosphatase: 136 IU/L — ABNORMAL HIGH (ref 44–121)
BUN/Creatinine Ratio: 21 (ref 12–28)
BUN: 25 mg/dL (ref 8–27)
Bilirubin Total: 0.5 mg/dL (ref 0.0–1.2)
CO2: 25 mmol/L (ref 20–29)
Calcium: 9.8 mg/dL (ref 8.7–10.3)
Chloride: 101 mmol/L (ref 96–106)
Creatinine, Ser: 1.21 mg/dL — ABNORMAL HIGH (ref 0.57–1.00)
Globulin, Total: 2.1 g/dL (ref 1.5–4.5)
Glucose: 151 mg/dL — ABNORMAL HIGH (ref 70–99)
Potassium: 4.6 mmol/L (ref 3.5–5.2)
Sodium: 140 mmol/L (ref 134–144)
Total Protein: 6.6 g/dL (ref 6.0–8.5)
eGFR: 49 mL/min/{1.73_m2} — ABNORMAL LOW (ref >59)

## 2022-02-07 LAB — CBC WITH DIFFERENTIAL/PLATELET
Basophils Absolute: 0 10*3/uL (ref 0.0–0.2)
Basos: 0 %
EOS (ABSOLUTE): 0.2 10*3/uL (ref 0.0–0.4)
Eos: 3 %
Hematocrit: 41.8 % (ref 34.0–46.6)
Hemoglobin: 13.6 g/dL (ref 11.1–15.9)
Immature Grans (Abs): 0 10*3/uL (ref 0.0–0.1)
Immature Granulocytes: 0 %
Lymphocytes Absolute: 2 10*3/uL (ref 0.7–3.1)
Lymphs: 39 %
MCH: 29.2 pg (ref 26.6–33.0)
MCHC: 32.5 g/dL (ref 31.5–35.7)
MCV: 90 fL (ref 79–97)
Monocytes Absolute: 0.4 10*3/uL (ref 0.1–0.9)
Monocytes: 8 %
Neutrophils Absolute: 2.6 10*3/uL (ref 1.4–7.0)
Neutrophils: 50 %
Platelets: 253 10*3/uL (ref 150–450)
RBC: 4.66 x10E6/uL (ref 3.77–5.28)
RDW: 14 % (ref 11.7–15.4)
WBC: 5.2 10*3/uL (ref 3.4–10.8)

## 2022-02-07 LAB — VITAMIN D 1,25 DIHYDROXY
Vitamin D 1, 25 (OH)2 Total: 18 pg/mL — ABNORMAL LOW
Vitamin D2 1, 25 (OH)2: <10 pg/mL
Vitamin D3 1, 25 (OH)2: 16 pg/mL

## 2022-02-07 LAB — LIPID PANEL
Chol/HDL Ratio: 4 ratio (ref 0.0–4.4)
Cholesterol, Total: 175 mg/dL (ref 100–199)
HDL: 44 mg/dL (ref >39)
LDL Chol Calc (NIH): 86 mg/dL (ref 0–99)
Triglycerides: 275 mg/dL — ABNORMAL HIGH (ref 0–149)
VLDL Cholesterol Cal: 45 mg/dL — ABNORMAL HIGH (ref 5–40)

## 2022-02-07 LAB — HEMOGLOBIN A1C
Est. average glucose Bld gHb Est-mCnc: 151 mg/dL
Hgb A1c MFr Bld: 6.9 % — ABNORMAL HIGH (ref 4.8–5.6)

## 2022-02-07 LAB — B12 AND FOLATE PANEL
Folate: >20 ng/mL (ref >3.0)
Vitamin B-12: 1115 pg/mL (ref 232–1245)

## 2022-02-07 NOTE — Assessment & Plan Note (Signed)
Intermittent tachycardia and palpitations.  Currently on metoprolol for management.  We will monitor thyroid levels today to ensure that this is not contributing.  If labs are found to be normal and symptoms continue recommend follow-up with cardiology for further evaluation and recommendations. ?

## 2022-02-07 NOTE — Assessment & Plan Note (Signed)
Chronic.  Atorvastatin 40 mg daily.  We will obtain labs today for evaluation and make changes to plan of care based on findings. ?

## 2022-02-07 NOTE — Progress Notes (Signed)
?Bianca Keeler, DNP, AGNP-c ?[facility] ?[address] ?[address] ?[place], [place] [address] ?[phone] Office [phone] Fax ? ?ESTABLISHED PATIENT- Chronic Health and/or Follow-Up Visit ? ?Blood pressure 117/72, temperature (!) 97 ?F (36.1 ?C), height 5\' 5"  (1.651 m), weight 146 lb 3.2 oz (66.3 kg), SpO2 99 %. ? ?No chief complaint on file. ? ? ?HPI ? ?Bianca Phillips  is a 66 y.o. year old female presenting today for evaluation and management of the following: ?Diabetes ?Good control ?Taking medication as prescribed w/o SE noted ?No significant highs or lows on BG readings ?No increased hunger, urination, or thirst ?No BG readings today ?No change in urination or new sx ?Is having vaginal itching, feels this may be yeast- not sure if related to BG ?Hypothyroid ?Taking medication as prescribed ?Recent has been experiencing tachycardia/palpitations intermittently ?No sure if levels are in control ?HLD ?Taking medication as prescribed ?No current side effects or symptoms present today ?Allergic Rhinitis ?Exacerbation recently with increased allergens in the environment  ?has been using cetirizine and Flonase for management however Flonase is quite drying ? ?ROS ?All ROS negative with exception of what is listed in HPI ? ?PHYSICAL EXAM ?Physical Exam ?Vitals and nursing note reviewed.  ?Constitutional:   ?   Appearance: Normal appearance.  ?HENT:  ?   Head: Normocephalic.  ?Eyes:  ?   Extraocular Movements: Extraocular movements intact.  ?   Conjunctiva/sclera: Conjunctivae normal.  ?   Pupils: Pupils are equal, round, and reactive to light.  ?Neck:  ?   Vascular: No carotid bruit.  ?Cardiovascular:  ?   Rate and Rhythm: Normal rate and regular rhythm.  ?   Pulses: Normal pulses.  ?   Heart sounds: Normal heart sounds.  ?Pulmonary:  ?   Effort: Pulmonary effort is normal.  ?   Breath sounds: Normal breath sounds.  ?Abdominal:  ?   General: Bowel sounds are normal.  ?    Palpations: Abdomen is soft.  ?Musculoskeletal:     ?   General: Normal range of motion.  ?   Right lower leg: No edema.  ?   Left lower leg: No edema.  ?Lymphadenopathy:  ?   Cervical: No cervical adenopathy.  ?Skin: ?   General: Skin is warm and dry.  ?   Capillary Refill: Capillary refill takes less than 2 seconds.  ?Neurological:  ?   General: No focal deficit present.  ?   Mental Status: She is alert and oriented to person, place, and time.  ?Psychiatric:     ?   Mood and Affect: Mood normal.     ?   Behavior: Behavior normal.     ?   Thought Content: Thought content normal.     ?   Judgment: Judgment normal.  ? ? ?ASSESSMENT & PLAN ?Problem List Items Addressed This Visit   ? ? Tachycardia  ?  Intermittent tachycardia and palpitations.  Currently on metoprolol for management.  We will monitor thyroid levels today to ensure that this is not contributing.  If labs are found to be normal and symptoms continue recommend follow-up with cardiology for further evaluation and recommendations. ? ?  ?  ? Relevant Orders  ? CBC with Differential (Completed)  ? Comprehensive metabolic panel (Completed)  ? Lipid Profile (Completed)  ? TSH  ? HgB A1c (Completed)  ? B12 and Folate Panel (Completed)  ? Vitamin B12  ? Vitamin D 1,25 dihydroxy (Completed)  ? Itching  in the vaginal area  ?  Suspect vaginal candidiasis in the setting of diabetes.  Will start treatment today with oral Diflucan.  Monitor closely for new or worsening symptoms.  May atorvastatin for 2 days while taking medication.  Follow-up if symptoms worsen or fail to improve. ? ?  ?  ? Type 2 diabetes mellitus with hyperglycemia, without long-term current use of insulin (HCC) - Primary  ?  Chronic.  Comorbid conditions include chronic kidney disease and hyperlipidemia.Marland Kitchen ?She is tolerating medication well.  We will obtain labs today for evaluation of average blood sugar levels to ensure that medications do not need to be changed. ?We will plan to monitor kidney  function and A1c closely. ?Diet and exercise recommendations provided. ?We will plan to follow-up in 3 months or sooner if needed. ? ?  ?  ? Relevant Orders  ? CBC with Differential (Completed)  ? Comprehensive metabolic panel (Completed)  ? Lipid Profile (Completed)  ? TSH  ? HgB A1c (Completed)  ? B12 and Folate Panel (Completed)  ? Vitamin B12  ? Vitamin D 1,25 dihydroxy (Completed)  ? Postoperative hypothyroidism  ?  Chronic.  She is experiencing symptoms of palpitations/tachycardia which could indicate a possible reduced effectiveness of current medication dose. ?We will obtain labs today for further evaluation and make recommendations to changes in plan of care as necessary. ? ?  ?  ? Relevant Orders  ? CBC with Differential (Completed)  ? Comprehensive metabolic panel (Completed)  ? Lipid Profile (Completed)  ? TSH  ? HgB A1c (Completed)  ? B12 and Folate Panel (Completed)  ? Vitamin B12  ? Vitamin D 1,25 dihydroxy (Completed)  ? Hyperlipidemia associated with type 2 diabetes mellitus ([place])  ?  Chronic.  Atorvastatin 40 mg daily.  We will obtain labs today for evaluation and make changes to plan of care based on findings. ? ?  ?  ? Relevant Orders  ? CBC with Differential (Completed)  ? Comprehensive metabolic panel (Completed)  ? Lipid Profile (Completed)  ? TSH  ? HgB A1c (Completed)  ? B12 and Folate Panel (Completed)  ? Vitamin B12  ? Vitamin D 1,25 dihydroxy (Completed)  ? Seasonal allergic rhinitis due to pollen  ?  Chronic.  Symptoms have worsened recently with increased allergens in the air.  We will plan to change fluticasone to mometasone nasal spray to help with dryness.  Follow-up if symptoms worsen or fail to improve. ? ?  ?  ? Relevant Medications  ? mometasone (NASONEX) 50 MCG/ACT nasal spray  ? Chronic kidney disease (CKD) stage G3b/A2, moderately decreased glomerular filtration rate (GFR) between 30-44 mL/min/1.73 square meter and albuminuria creatinine ratio between 30-299 mg/g (HCC)  ?   Chronic kidney disease in the setting of diabetes and hyperlipidemia.  Blood pressure is well controlled today.  She is not currently on any kidney protective medication.  We will obtain labs for further evaluation and consider option of adding Farxiga to medication regimen for additional kidney and cardiovascular protection in the setting of diabetes. ?We will make changes to plan of care based on findings. ? ?  ?  ? Relevant Orders  ? CBC with Differential (Completed)  ? Comprehensive metabolic panel (Completed)  ? Lipid Profile (Completed)  ? TSH  ? HgB A1c (Completed)  ? B12 and Folate Panel (Completed)  ? Vitamin B12  ? Vitamin D 1,25 dihydroxy (Completed)  ? Heart palpitations  ? Relevant Orders  ? CBC with Differential (Completed)  ?  Comprehensive metabolic panel (Completed)  ? Lipid Profile (Completed)  ? TSH  ? HgB A1c (Completed)  ? B12 and Folate Panel (Completed)  ? Vitamin B12  ? Vitamin D 1,25 dihydroxy (Completed)  ? ? ? ?FOLLOW-UP ?Return for TBD based on labs. ? ? ?Bianca Keeler, DNP, AGNP-c ?01/28/2022  4:09 PM ?

## 2022-02-07 NOTE — Assessment & Plan Note (Signed)
Suspect vaginal candidiasis in the setting of diabetes.  Will start treatment today with oral Diflucan.  Monitor closely for new or worsening symptoms.  May atorvastatin for 2 days while taking medication.  Follow-up if symptoms worsen or fail to improve. ?

## 2022-02-07 NOTE — Assessment & Plan Note (Signed)
Chronic.  Symptoms have worsened recently with increased allergens in the air.  We will plan to change fluticasone to mometasone nasal spray to help with dryness.  Follow-up if symptoms worsen or fail to improve. ?

## 2022-02-07 NOTE — Assessment & Plan Note (Signed)
Chronic.  She is experiencing symptoms of palpitations/tachycardia which could indicate a possible reduced effectiveness of current medication dose. ?We will obtain labs today for further evaluation and make recommendations to changes in plan of care as necessary. ?

## 2022-02-07 NOTE — Assessment & Plan Note (Signed)
Chronic kidney disease in the setting of diabetes and hyperlipidemia.  Blood pressure is well controlled today.  She is not currently on any kidney protective medication.  We will obtain labs for further evaluation and consider option of adding Farxiga to medication regimen for additional kidney and cardiovascular protection in the setting of diabetes. ?We will make changes to plan of care based on findings. ?

## 2022-02-07 NOTE — Assessment & Plan Note (Signed)
Chronic.  Comorbid conditions include chronic kidney disease and hyperlipidemia.Marland Kitchen ?She is tolerating medication well.  We will obtain labs today for evaluation of average blood sugar levels to ensure that medications do not need to be changed. ?We will plan to monitor kidney function and A1c closely. ?Diet and exercise recommendations provided. ?We will plan to follow-up in 3 months or sooner if needed. ?

## 2022-02-08 ENCOUNTER — Other Ambulatory Visit (HOSPITAL_BASED_OUTPATIENT_CLINIC_OR_DEPARTMENT_OTHER): Payer: Self-pay

## 2022-02-08 DIAGNOSIS — E1165 Type 2 diabetes mellitus with hyperglycemia: Secondary | ICD-10-CM

## 2022-02-08 DIAGNOSIS — E559 Vitamin D deficiency, unspecified: Secondary | ICD-10-CM

## 2022-02-08 MED ORDER — DAPAGLIFLOZIN PROPANEDIOL 10 MG PO TABS: 10.0000 mg | ORAL_TABLET | Freq: Every day | ORAL | 0 refills | Status: DC

## 2022-02-08 MED ORDER — VITAMIN D (ERGOCALCIFEROL) 1.25 MG (50000 UNIT) PO CAPS: 50000.0000 [IU] | ORAL_CAPSULE | ORAL | 0 refills | Status: DC

## 2022-02-08 NOTE — Addendum Note (Signed)
Addended by: Aneesha Holloran, [place] Dec E on: 02/08/2022 07:40 AM ? ? Modules accepted: Orders ? ?

## 2022-03-08 ENCOUNTER — Other Ambulatory Visit (HOSPITAL_BASED_OUTPATIENT_CLINIC_OR_DEPARTMENT_OTHER): Payer: Self-pay | Admitting: Nurse Practitioner

## 2022-03-08 ENCOUNTER — Ambulatory Visit (HOSPITAL_BASED_OUTPATIENT_CLINIC_OR_DEPARTMENT_OTHER): Payer: Medicare Other

## 2022-03-08 DIAGNOSIS — E1165 Type 2 diabetes mellitus with hyperglycemia: Secondary | ICD-10-CM

## 2022-03-08 DIAGNOSIS — R300 Dysuria: Secondary | ICD-10-CM | POA: Diagnosis not present

## 2022-03-09 ENCOUNTER — Telehealth (HOSPITAL_BASED_OUTPATIENT_CLINIC_OR_DEPARTMENT_OTHER): Payer: Self-pay

## 2022-03-09 ENCOUNTER — Telehealth (HOSPITAL_BASED_OUTPATIENT_CLINIC_OR_DEPARTMENT_OTHER): Payer: Self-pay | Admitting: Nurse Practitioner

## 2022-03-09 LAB — URINE CULTURE

## 2022-03-09 NOTE — Telephone Encounter (Signed)
Spoke with patient, she called in crying regarding her results of UTI. I explained to her per Minna Merritts the urine sent off to culture due to her recurring UTI's. I told her once we had results we would give her a call. Patient verbal understood.

## 2022-03-09 NOTE — Telephone Encounter (Signed)
Pt called regarding Urine Sample left on 6/5. Pt was wondering on results. I did not see results in sent pt to nurse line. Let pt know that we might have had to send off for culture. Please advise.

## 2022-03-09 NOTE — Telephone Encounter (Signed)
Pt is calling again regarding if she needs antibiotics due to her having some pain and discomfit. Pt would like a call from CMA. Please advise.

## 2022-03-10 NOTE — Telephone Encounter (Signed)
Pt called again regarding Urine results. Results were received today SB looked at them and there was no bacteria growth of any sort. SB related to let pt know to either contact her OBGYN or her Urologist. Pt showed understanding. Please advise.

## 2022-03-10 NOTE — Telephone Encounter (Signed)
Pt called again regarding Urine results. Results were received today SB looked at them and there was no bacteria growth of any sort. SB related to let pt know to either contact her OBGYN or her Urologist. Pt showed understanding. Please advise. 

## 2022-03-12 ENCOUNTER — Encounter (HOSPITAL_BASED_OUTPATIENT_CLINIC_OR_DEPARTMENT_OTHER): Payer: Self-pay | Admitting: Emergency Medicine

## 2022-03-12 ENCOUNTER — Emergency Department (HOSPITAL_BASED_OUTPATIENT_CLINIC_OR_DEPARTMENT_OTHER): Payer: Medicare Other

## 2022-03-12 ENCOUNTER — Other Ambulatory Visit: Payer: Self-pay

## 2022-03-12 ENCOUNTER — Emergency Department (HOSPITAL_BASED_OUTPATIENT_CLINIC_OR_DEPARTMENT_OTHER)
Admission: EM | Admit: 2022-03-12 | Discharge: 2022-03-12 | Disposition: A | Discharge status: Home / Self Care | Payer: Medicare Other | Attending: Emergency Medicine | Admitting: Emergency Medicine

## 2022-03-12 DIAGNOSIS — E039 Hypothyroidism, unspecified: Secondary | ICD-10-CM | POA: Diagnosis not present

## 2022-03-12 DIAGNOSIS — E119 Type 2 diabetes mellitus without complications: Secondary | ICD-10-CM | POA: Insufficient documentation

## 2022-03-12 DIAGNOSIS — N132 Hydronephrosis with renal and ureteral calculous obstruction: Secondary | ICD-10-CM | POA: Diagnosis not present

## 2022-03-12 DIAGNOSIS — Z87891 Personal history of nicotine dependence: Secondary | ICD-10-CM | POA: Diagnosis not present

## 2022-03-12 DIAGNOSIS — N200 Calculus of kidney: Secondary | ICD-10-CM | POA: Diagnosis not present

## 2022-03-12 DIAGNOSIS — Z8616 Personal history of COVID-19: Secondary | ICD-10-CM | POA: Diagnosis not present

## 2022-03-12 DIAGNOSIS — R319 Hematuria, unspecified: Secondary | ICD-10-CM | POA: Diagnosis present

## 2022-03-12 LAB — CBC
HCT: 42.6 % (ref 36.0–46.0)
Hemoglobin: 13.9 g/dL (ref 12.0–15.0)
MCH: 29 pg (ref 26.0–34.0)
MCHC: 32.6 g/dL (ref 30.0–36.0)
MCV: 88.8 fL (ref 80.0–100.0)
Platelets: 235 10*3/uL (ref 150–400)
RBC: 4.8 MIL/uL (ref 3.87–5.11)
RDW: 12.7 % (ref 11.5–15.5)
WBC: 7.8 10*3/uL (ref 4.0–10.5)
nRBC: 0 % (ref 0.0–0.2)

## 2022-03-12 LAB — URINALYSIS, ROUTINE W REFLEX MICROSCOPIC
Bilirubin Urine: NEGATIVE
Glucose, UA: >1000 mg/dL — AB
Ketones, ur: NEGATIVE mg/dL
Nitrite: NEGATIVE
Protein, ur: 100 mg/dL — AB
RBC / HPF: >50 RBC/hpf — ABNORMAL HIGH (ref 0–5)
Specific Gravity, Urine: 1.022 (ref 1.005–1.030)
Trans Epithel, UA: 2
WBC, UA: >50 WBC/hpf — ABNORMAL HIGH (ref 0–5)
pH: 5.5 (ref 5.0–8.0)

## 2022-03-12 LAB — BASIC METABOLIC PANEL
Anion gap: 12 (ref 5–15)
BUN: 32 mg/dL — ABNORMAL HIGH (ref 8–23)
CO2: 21 mmol/L — ABNORMAL LOW (ref 22–32)
Calcium: 10.5 mg/dL — ABNORMAL HIGH (ref 8.9–10.3)
Chloride: 107 mmol/L (ref 98–111)
Creatinine, Ser: 1.12 mg/dL — ABNORMAL HIGH (ref 0.44–1.00)
GFR, Estimated: 54 mL/min — ABNORMAL LOW (ref >60)
Glucose, Bld: 126 mg/dL — ABNORMAL HIGH (ref 70–99)
Potassium: 3.8 mmol/L (ref 3.5–5.1)
Sodium: 140 mmol/L (ref 135–145)

## 2022-03-12 MED ORDER — TAMSULOSIN HCL 0.4 MG PO CAPS: 0.4000 mg | ORAL_CAPSULE | Freq: Every day | ORAL | 0 refills | Status: DC

## 2022-03-12 MED ORDER — CEPHALEXIN 500 MG PO CAPS: 500.0000 mg | ORAL_CAPSULE | Freq: Three times a day (TID) | ORAL | 0 refills | Status: AC

## 2022-03-12 NOTE — ED Notes (Signed)
Patient transported to CT 

## 2022-03-12 NOTE — Discharge Instructions (Signed)
You were evaluated in the Emergency Department and after careful evaluation, we did not find any emergent condition requiring admission or further testing in the hospital.  Your exam/testing today is overall reassuring.  Symptoms seem to be due to a 2 mm kidney stone on the left.  Continue use of Tylenol or Motrin for pain.  Can use your hydrocodone at home for more significant pain.  Use the Flomax daily to help pass the stone.  We discussed your urinalysis and are starting you on an antibiotic to treat any possible infection.  Take the Keflex as directed.  We sent your urine for culture.  Please return to the Emergency Department if you experience any worsening of your condition.   Thank you for allowing [place] to be a part of your care.

## 2022-03-12 NOTE — ED Notes (Signed)
Lab notified of urine culture add on 

## 2022-03-12 NOTE — ED Triage Notes (Signed)
Pt states she has HX of kidney stones, surgery x2. Was recommended ED visit by Urology triage RN d/t new onset of hematuria on approx 1 hr ago. States seeing bright red blood on toilet paper, none in toilet.  C/o lower abdominal pain, with left flank pain, and increased urinary urgency. Took tylenol at 3:30 am, Ibuprofen at 2300 yesterday 6/8. No abd tenderness on palpation, No acute distress in triage. Per note has scheduled Urology appt Monday.

## 2022-03-12 NOTE — ED Provider Notes (Signed)
[facility] Emergency Department Provider Note MRN:  [id number]  Arrival date & time: 03/12/22     Chief Complaint   Hematuria   History of Present Illness   Bianca Phillips is a 66 y.o. year-old female with a history of diabetes, kidney stones presenting to the ED with chief complaint of flank pain.  Left flank pain over the past several hours, radiation to the lower abdomen.  Noted some blood in the urine.  No fever, pain is improved after Motrin at this time.  Review of Systems  A thorough review of systems was obtained and all systems are negative except as noted in the HPI and PMH.   Patient's Health History    Past Medical History:  Diagnosis Date   Complication of anesthesia    Constipation    GAD (generalized anxiety disorder)    Heart murmur    Heart palpitations    evaluated by cardiologist--- dr h. Johney Frame, note in epic 04-03-2021, normal echo and monitor showed NSR w/ SVT   History of COVID-19    per pt had covid twice in 12/ 2020 with mild symptoms with residual intermittant loss of taste;  and 04/ 2022 with mild symptoms that resolved   History of hyperthyroidism    age 47 s/p RAI   History of kidney stones    History of recurrent UTIs    History of repair of hiatal hernia 02/2020   Hyperlipidemia    Hypothyroidism, postablative    age 60  s/p RAI for hyperthyroidism;  followed by pcp   IDA (iron deficiency anemia)    MDD (major depressive disorder)    OA (osteoarthritis)    Right ureteral calculus    Seasonal allergic rhinitis    Type 2 diabetes mellitus ([place])    followed by pcp    (07-14-2021  per pt checks blood sugar at home 2-3 times weekly,  fasting sugar --120-130)   Urgency of urination    Wears hearing aid in both ears     Past Surgical History:  Procedure Laterality Date   CESAREAN SECTION     x2  last one 1986   CYSTOSCOPY/URETEROSCOPY/HOLMIUM LASER/STENT PLACEMENT  2012   approx    CYSTOSCOPY/URETEROSCOPY/HOLMIUM LASER/STENT PLACEMENT Right 07/20/2021   Procedure: CYSTOSCOPY/RETROGRADE/URETEROSCOPY/HOLMIUM LASER/STENT PLACEMENT;  Surgeon: Janith Lima, MD;  Location: [facility];  Service: Urology;  Laterality: Right;  ONLY NEEDS 45 MIN   LAPAROSCOPIC NISSEN FUNDOPLICATION  [id number]   TOTAL ABDOMINAL HYSTERECTOMY W/ BILATERAL SALPINGOOPHORECTOMY  2002   TOTAL HIP ARTHROPLASTY Right 08/30/2021   Procedure: TOTAL HIP ARTHROPLASTY ANTERIOR APPROACH;  Surgeon: Mcarthur Rossetti, MD;  Location: [facility];  Service: Orthopedics;  Laterality: Right;    History reviewed. No pertinent family history.  Social History   Socioeconomic History   Marital status: Divorced    Spouse name: Not on file   Number of children: 3   Years of education: 16   Highest education level: Bachelor's degree (e.g., BA, AB, BS)  Occupational History   Occupation: retired    Comment: Pharmacist, hospital  Tobacco Use   Smoking status: Former    Packs/day: 0.50    Years: 10.00    Total pack years: 5.00    Types: Cigarettes    Quit date: 1988    Years since quitting: 35.4   Smokeless tobacco: Never  Vaping Use   Vaping Use: Never used  Substance and Sexual Activity   Alcohol use: Not Currently   Drug  use: Never   Sexual activity: Not Currently    Birth control/protection: Post-menopausal, Surgical  Other Topics Concern   Not on file  Social History Narrative   Not on file   Social Determinants of Health   Financial Resource Strain: Not on file  Food Insecurity: Not on file  Transportation Needs: Not on file  Physical Activity: Not on file  Stress: Not on file  Social Connections: Not on file  Intimate Partner Violence: Not on file     Physical Exam   Vitals:   03/12/22 0352 03/12/22 0500  BP: (!) 165/74 (!) 153/64  Pulse: 91 88  Resp: 17 16  Temp: 97.7 F (36.5 C)   SpO2: 100% 98%    CONSTITUTIONAL: Well-appearing, NAD NEURO/PSYCH:  Alert and oriented x 3, no  focal deficits EYES:  eyes equal and reactive ENT/NECK:  no LAD, no JVD CARDIO: Regular rate, well-perfused, normal S1 and S2 PULM:  CTAB no wheezing or rhonchi GI/GU:  non-distended, non-tender MSK/SPINE:  No gross deformities, no edema SKIN:  no rash, atraumatic   *Additional and/or pertinent findings included in MDM below  Diagnostic and Interventional Summary    EKG Interpretation  Date/Time:    Ventricular Rate:    PR Interval:    QRS Duration:   QT Interval:    QTC Calculation:   R Axis:     Text Interpretation:         Labs Reviewed  URINALYSIS, ROUTINE W REFLEX MICROSCOPIC - Abnormal; Notable for the following components:      Result Value   APPearance CLOUDY (*)    Glucose, UA >1,000 (*)    Hgb urine dipstick MODERATE (*)    Protein, ur 100 (*)    Leukocytes,Ua LARGE (*)    RBC / HPF >50 (*)    WBC, UA >50 (*)    Bacteria, UA FEW (*)    All other components within normal limits  BASIC METABOLIC PANEL - Abnormal; Notable for the following components:   CO2 21 (*)    Glucose, Bld 126 (*)    BUN 32 (*)    Creatinine, Ser 1.12 (*)    Calcium 10.5 (*)    GFR, Estimated 54 (*)    All other components within normal limits  URINE CULTURE  CBC    CT RENAL STONE STUDY  Final Result      Medications - No data to display   Procedures  /  Critical Care Procedures  ED Course and Medical Decision Making  Initial Impression and Ddx Differential diagnosis includes kidney stone, diverticulitis, AAA.  Pain improved at this time though she has a history of requiring procedural intervention for large stones.  Awaiting labs, CT.  Past medical/surgical history that increases complexity of ED encounter: History of kidney stones  Interpretation of Diagnostics I personally reviewed the laboratory evaluation and my interpretation is as follows: No significant blood count or electrolyte disturbance, largely baseline kidney function  CT revealing 2 mm kidney stone  with some hydronephrosis.  Patient Reassessment and Ultimate Disposition/Management     On reassessment patient continues to sit comfortably.  Urinalysis with large reds, large whites, leuk positive, few bacteria.  Clinically does not have infection, no fever, no leukocytosis but given these findings will cover with Keflex, she has follow-up on Monday with her urologist.  Sending urine for culture, she may be able to stop this antibiotic when it returns or if deemed necessary by urology.  Patient management required discussion  with the following services or consulting groups:  None  Complexity of Problems Addressed Acute illness or injury that poses threat of life of bodily function  Additional Data Reviewed and Analyzed Further history obtained from: Further history from spouse/family member  Additional Factors Impacting ED Encounter Risk Prescriptions  Barth Kirks. Sedonia Small, MD [place] [email or web address]  Final Clinical Impressions(s) / ED Diagnoses     ICD-10-CM   1. Kidney stone  N20.0       ED Discharge Orders          Ordered    cephALEXin (KEFLEX) 500 MG capsule  3 times daily        03/12/22 0525    tamsulosin (FLOMAX) 0.4 MG CAPS capsule  Daily        03/12/22 0525             Discharge Instructions Discussed with and Provided to Patient:     Discharge Instructions      You were evaluated in the Emergency Department and after careful evaluation, we did not find any emergent condition requiring admission or further testing in the hospital.  Your exam/testing today is overall reassuring.  Symptoms seem to be due to a 2 mm kidney stone on the left.  Continue use of Tylenol or Motrin for pain.  Can use your hydrocodone at home for more significant pain.  Use the Flomax daily to help pass the stone.  We discussed your urinalysis and are starting you on an antibiotic to treat any possible infection.  Take the Keflex  as directed.  We sent your urine for culture.  Please return to the Emergency Department if you experience any worsening of your condition.   Thank you for allowing [place] to be a part of your care.       Maudie Flakes, MD 03/12/22 [phone]

## 2022-03-13 LAB — URINE CULTURE: Culture: >=100000 — AB

## 2022-03-14 ENCOUNTER — Telehealth (HOSPITAL_BASED_OUTPATIENT_CLINIC_OR_DEPARTMENT_OTHER): Payer: Self-pay | Admitting: *Deleted

## 2022-03-14 NOTE — Telephone Encounter (Signed)
Post ED Visit - Positive Culture Follow-up  Culture report reviewed by antimicrobial stewardship pharmacist: [facility] [x]  , Pharm.D. []  Daylene Posey, Pharm.D., BCPS AQ-ID []  , Pharm.D., BCPS []  Celedonio Miyamoto, Pharm.D., BCPS []  [place], Garvin Fila.D., BCPS, AAHIVP []  , Pharm.D., BCPS, AAHIVP []  Georgina Pillion, PharmD, BCPS []  , PharmD, BCPS []  [place], PharmD, BCPS []  [address], PharmD []  , PharmD, BCPS []  Estella Husk, PharmD  Pharmacy Team []  Lysle Pearl, PharmD []  , PharmD []  Phillips Climes, PharmD []  , Rph []  Agapito Games) , PharmD []  Verlan Friends, PharmD []  , PharmD []  Mervyn Gay, PharmD []  , PharmD []  Vinnie Level, PharmD []  [facility], PharmD []  , PharmD []  Len Childs, PharmD   Positive urine culture Treated with Cephalexin. Pt has urology follow up Monday. And no further patient follow-up is required at this time per Dr. Greer Pickerel, 03/14/2022, 10:21 AM

## 2022-03-15 DIAGNOSIS — N200 Calculus of kidney: Secondary | ICD-10-CM | POA: Diagnosis not present

## 2022-03-15 DIAGNOSIS — N390 Urinary tract infection, site not specified: Secondary | ICD-10-CM | POA: Diagnosis not present

## 2022-03-15 DIAGNOSIS — N202 Calculus of kidney with calculus of ureter: Secondary | ICD-10-CM | POA: Diagnosis not present

## 2022-03-21 ENCOUNTER — Other Ambulatory Visit (HOSPITAL_BASED_OUTPATIENT_CLINIC_OR_DEPARTMENT_OTHER): Payer: Self-pay | Admitting: Nurse Practitioner

## 2022-03-21 DIAGNOSIS — E1165 Type 2 diabetes mellitus with hyperglycemia: Secondary | ICD-10-CM

## 2022-03-21 DIAGNOSIS — E782 Mixed hyperlipidemia: Secondary | ICD-10-CM

## 2022-03-30 ENCOUNTER — Encounter (HOSPITAL_BASED_OUTPATIENT_CLINIC_OR_DEPARTMENT_OTHER): Payer: Self-pay | Admitting: Nurse Practitioner

## 2022-03-30 ENCOUNTER — Ambulatory Visit (INDEPENDENT_AMBULATORY_CARE_PROVIDER_SITE_OTHER): Payer: Medicare Other | Admitting: Nurse Practitioner

## 2022-03-30 VITALS — BP 117/72 | HR 86 | Ht 65.0 in | Wt 135.0 lb

## 2022-03-30 DIAGNOSIS — E1165 Type 2 diabetes mellitus with hyperglycemia: Secondary | ICD-10-CM

## 2022-03-30 DIAGNOSIS — R634 Abnormal weight loss: Secondary | ICD-10-CM

## 2022-03-30 DIAGNOSIS — N1832 Chronic kidney disease, stage 3b: Secondary | ICD-10-CM | POA: Diagnosis not present

## 2022-03-30 DIAGNOSIS — E890 Postprocedural hypothyroidism: Secondary | ICD-10-CM | POA: Diagnosis not present

## 2022-03-30 NOTE — Progress Notes (Signed)
Bianca Clamp, DNP, AGNP-c [facility] [address] [place], [place] [address] [phone] Office [phone] Fax  ESTABLISHED PATIENT- Chronic Health and/or Follow-Up Visit  Blood pressure 117/72, pulse 86, height 5\' 5"  (1.651 m), weight 135 lb (61.2 kg), SpO2 99 %.  Follow-up (Patient is following up weight loss since Farxiga, her daughter is a 10-08-2002 and she stopped taking the medication. )   HPI  Bianca Phillips  is a 66 y.o. year old female presenting today for evaluation and management of the following: Weight Loss Bianca Phillips reports decrease in appetite since starting her Bianca Phillips and an approximate 10 Phillips weight loss.  She tells me that her daughter is a Bianca Phillips and advised her to stop the Engineer, civil (consulting) due to concerns with the weight loss and appetite changes.  She also tells me that she has been up on antibiotics and Diflucan for recent UTI, she is not sure if this has also contributed to her symptoms.  She reports that she does feel "clammy" but has not had any night sweats or fevers that she is aware of.  She does endorse a "weird sensation" when she urinates.  She describes this as a tingling that runs upward towards the abdomen from the urethra.  She also tells me her abdomen feels full and heavy.  She has had a total hysterectomy.  She has not had any vaginal bleeding or vaginal discharge.  She is not currently sexually active.  ROS All ROS negative with exception of what is listed in HPI  PHYSICAL EXAM Physical Exam Vitals and nursing note reviewed.  Constitutional:      General: She is not in acute distress.    Appearance: Normal appearance.  HENT:     Head: Normocephalic.  Eyes:     Extraocular Movements: Extraocular movements intact.     Conjunctiva/sclera: Conjunctivae normal.     Pupils: Pupils are equal, round, and reactive to light.  Neck:     Vascular: No carotid bruit.  Cardiovascular:     Rate and Rhythm: Normal  rate and regular rhythm.     Pulses: Normal pulses.     Heart sounds: Normal heart sounds. No murmur heard. Pulmonary:     Effort: Pulmonary effort is normal.     Breath sounds: Normal breath sounds. No wheezing.  Abdominal:     General: Bowel sounds are normal. There is no distension.     Palpations: Abdomen is soft. There is no mass.     Tenderness: There is no abdominal tenderness. There is no right CVA tenderness, left CVA tenderness, guarding or rebound.  Musculoskeletal:        General: Normal range of motion.     Cervical back: Normal range of motion and neck supple.     Right lower leg: No edema.     Left lower leg: No edema.  Lymphadenopathy:     Cervical: No cervical adenopathy.  Skin:    General: Skin is warm and dry.     Capillary Refill: Capillary refill takes less than 2 seconds.  Neurological:     General: No focal deficit present.     Mental Status: She is alert and oriented to person, place, and time.  Psychiatric:        Mood and Affect: Mood normal.        Behavior: Behavior normal.        Thought Content: Thought content normal.        Judgment:  Judgment normal.     ASSESSMENT & PLAN Problem List Items Addressed This Visit     Chronic kidney disease (CKD) stage G3b/A2, moderately decreased glomerular filtration rate (GFR) between 30-44 mL/min/1.73 square meter and albuminuria creatinine ratio between 30-299 mg/g (HCC)    Chronic kidney disease in the setting of diabetes and hyperlipidemia.  She does have good control over her blood pressure.  She was recently started on Farxiga for kidney protection however she has had an approximate 10 Phillips weight loss since starting this medication.  Unclear if the symptoms are caused by the medication or current UTI and side effects of medication. We will plan for blood work and UA today to determine if there is any additional causes that could be identified. Given that the patient is experiencing unusual tingling  sensation during urination with radiation into the abdomen recommend urology evaluation to help determine causative factor.  She is established with urology.  She may need gynecological evaluation if this is unrevealing. Recommend continuation of antibiotics for urinary tract infection until complete.  We will plan to recheck urine if symptoms persist.      Relevant Orders   Comprehensive metabolic panel (Completed)   Hemoglobin A1c (Completed)   CBC with Differential/Platelet (Completed)   TSH (Completed)   Pathologist smear review   T4, free (Completed)   T4 (Completed)   T3 (Completed)   Unintentional weight loss of more than 10 pounds in 90 days - Primary    Decreased appetite and subsequent unintentional weight loss since initiating Farxiga approximately on 03/09/2022.  Patient was started on medication for reduced kidney function.  She has subsequently stopped this medication under the advisement of her daughter who is a Engineer, civil (consulting).  Unclear if recent urinary tract infection and medication use is could be contributing. At this time unclear if improved management of diabetes, Bianca Phillips, underlying reduction In kidney function, or recent infection or other causative factor for her weight loss.  Recommend further investigation with laboratory work to assess the underlying cause. Okay to continue to hold Farxiga at this time until we can determine if this could be contributing to her symptoms. We will make changes to plan of care based on lab findings.      Relevant Orders   Comprehensive metabolic panel (Completed)   Hemoglobin A1c (Completed)   CBC with Differential/Platelet (Completed)   TSH (Completed)   Pathologist smear review   T4, free (Completed)   T4 (Completed)   T3 (Completed)   Type 2 diabetes mellitus with hyperglycemia, without long-term current use of insulin (HCC)   Relevant Orders   Comprehensive metabolic panel (Completed)   Hemoglobin A1c (Completed)   CBC with  Differential/Platelet (Completed)   TSH (Completed)   Pathologist smear review   T4, free (Completed)   T4 (Completed)   T3 (Completed)   Postoperative hypothyroidism   Relevant Orders   Comprehensive metabolic panel (Completed)   Hemoglobin A1c (Completed)   CBC with Differential/Platelet (Completed)   TSH (Completed)   Pathologist smear review   T4, free (Completed)   T4 (Completed)   T3 (Completed)     FOLLOW-UP Return for TBD- after.   Bianca Clamp, DNP, AGNP-c 03/30/2022 10:11 AM

## 2022-03-31 ENCOUNTER — Ambulatory Visit (HOSPITAL_BASED_OUTPATIENT_CLINIC_OR_DEPARTMENT_OTHER): Payer: Medicare Other

## 2022-03-31 DIAGNOSIS — E890 Postprocedural hypothyroidism: Secondary | ICD-10-CM | POA: Diagnosis not present

## 2022-03-31 DIAGNOSIS — E559 Vitamin D deficiency, unspecified: Secondary | ICD-10-CM

## 2022-03-31 DIAGNOSIS — N1832 Chronic kidney disease, stage 3b: Secondary | ICD-10-CM | POA: Diagnosis not present

## 2022-03-31 DIAGNOSIS — R634 Abnormal weight loss: Secondary | ICD-10-CM | POA: Diagnosis not present

## 2022-03-31 DIAGNOSIS — E1165 Type 2 diabetes mellitus with hyperglycemia: Secondary | ICD-10-CM | POA: Diagnosis not present

## 2022-04-01 LAB — CBC WITH DIFFERENTIAL/PLATELET
Basophils Absolute: 0 10*3/uL (ref 0.0–0.2)
Basos: 1 %
EOS (ABSOLUTE): 0.1 10*3/uL (ref 0.0–0.4)
Eos: 2 %
Hematocrit: 40.4 % (ref 34.0–46.6)
Hemoglobin: 13.4 g/dL (ref 11.1–15.9)
Immature Grans (Abs): 0 10*3/uL (ref 0.0–0.1)
Immature Granulocytes: 1 %
Lymphocytes Absolute: 2.4 10*3/uL (ref 0.7–3.1)
Lymphs: 36 %
MCH: 29.6 pg (ref 26.6–33.0)
MCHC: 33.2 g/dL (ref 31.5–35.7)
MCV: 89 fL (ref 79–97)
Monocytes Absolute: 0.5 10*3/uL (ref 0.1–0.9)
Monocytes: 7 %
Neutrophils Absolute: 3.6 10*3/uL (ref 1.4–7.0)
Neutrophils: 53 %
Platelets: 515 10*3/uL — ABNORMAL HIGH (ref 150–450)
RBC: 4.52 x10E6/uL (ref 3.77–5.28)
RDW: 12.5 % (ref 11.7–15.4)
WBC: 6.6 10*3/uL (ref 3.4–10.8)

## 2022-04-01 LAB — COMPREHENSIVE METABOLIC PANEL
ALT: 45 IU/L — ABNORMAL HIGH (ref 0–32)
AST: 19 IU/L (ref 0–40)
Albumin/Globulin Ratio: 1.4 (ref 1.2–2.2)
Albumin: 4.3 g/dL (ref 3.8–4.8)
Alkaline Phosphatase: 128 IU/L — ABNORMAL HIGH (ref 44–121)
BUN/Creatinine Ratio: 25 (ref 12–28)
BUN: 41 mg/dL — ABNORMAL HIGH (ref 8–27)
Bilirubin Total: 0.2 mg/dL (ref 0.0–1.2)
CO2: 18 mmol/L — ABNORMAL LOW (ref 20–29)
Calcium: 10.1 mg/dL (ref 8.7–10.3)
Chloride: 101 mmol/L (ref 96–106)
Creatinine, Ser: 1.61 mg/dL — ABNORMAL HIGH (ref 0.57–1.00)
Globulin, Total: 3 g/dL (ref 1.5–4.5)
Glucose: 182 mg/dL — ABNORMAL HIGH (ref 70–99)
Potassium: 5.1 mmol/L (ref 3.5–5.2)
Sodium: 137 mmol/L (ref 134–144)
Total Protein: 7.3 g/dL (ref 6.0–8.5)
eGFR: 35 mL/min/{1.73_m2} — ABNORMAL LOW (ref >59)

## 2022-04-01 LAB — T4, FREE: Free T4: 2.78 ng/dL — ABNORMAL HIGH (ref 0.82–1.77)

## 2022-04-01 LAB — T4: T4, Total: 14 ug/dL — ABNORMAL HIGH (ref 4.5–12.0)

## 2022-04-01 LAB — T3: T3, Total: 131 ng/dL (ref 71–180)

## 2022-04-01 LAB — HEMOGLOBIN A1C
Est. average glucose Bld gHb Est-mCnc: 166 mg/dL
Hgb A1c MFr Bld: 7.4 % — ABNORMAL HIGH (ref 4.8–5.6)

## 2022-04-01 LAB — VITAMIN D 25 HYDROXY (VIT D DEFICIENCY, FRACTURES): Vit D, 25-Hydroxy: 54.3 ng/mL (ref 30.0–100.0)

## 2022-04-01 LAB — TSH: TSH: 3.41 u[IU]/mL (ref 0.450–4.500)

## 2022-04-02 ENCOUNTER — Telehealth (HOSPITAL_BASED_OUTPATIENT_CLINIC_OR_DEPARTMENT_OTHER): Payer: Self-pay | Admitting: Nurse Practitioner

## 2022-04-02 NOTE — Telephone Encounter (Signed)
Pt called regarding lab results. Let pt know that the office policy for labs results are 7-14 business days for the provider to review after all labs are back. Let pt know that when the provider has reviewed the labs the CMA will be in contact with them. Pt showed understanding. Labs were just taken on 6/28 pt called on 6/30 Please advise.

## 2022-04-04 NOTE — Telephone Encounter (Signed)
Provider responded to patient on 6/30 via MyChart with lab result acknowledgment

## 2022-04-05 ENCOUNTER — Ambulatory Visit (HOSPITAL_BASED_OUTPATIENT_CLINIC_OR_DEPARTMENT_OTHER): Payer: Medicare Other | Admitting: Nurse Practitioner

## 2022-04-05 ENCOUNTER — Telehealth (HOSPITAL_BASED_OUTPATIENT_CLINIC_OR_DEPARTMENT_OTHER): Payer: Self-pay

## 2022-04-05 NOTE — Telephone Encounter (Signed)
Spoke with patient. An appointment was made for her to see Bianca Phillips and she just needed labs. I spoke to patient in detail and gave her the information. She will come in on Wednesday for labs only. Once Bianca Phillips reviews those labs a decision will be made if she needs to be seen at that time.   [employer]

## 2022-04-05 NOTE — Progress Notes (Signed)
Called pt on 7/3 and lvm to schedule nurse visit for labs next week to repeat CMP and CBC.

## 2022-04-05 NOTE — Telephone Encounter (Signed)
Enounter made in error.

## 2022-04-07 ENCOUNTER — Ambulatory Visit (HOSPITAL_BASED_OUTPATIENT_CLINIC_OR_DEPARTMENT_OTHER): Payer: Medicare Other

## 2022-04-07 ENCOUNTER — Other Ambulatory Visit (HOSPITAL_BASED_OUTPATIENT_CLINIC_OR_DEPARTMENT_OTHER): Payer: Self-pay | Admitting: Nurse Practitioner

## 2022-04-07 DIAGNOSIS — Z79899 Other long term (current) drug therapy: Secondary | ICD-10-CM | POA: Diagnosis not present

## 2022-04-07 DIAGNOSIS — N200 Calculus of kidney: Secondary | ICD-10-CM | POA: Diagnosis not present

## 2022-04-07 LAB — CBC
Hematocrit: 38 % (ref 34.0–46.6)
Hemoglobin: 12.4 g/dL (ref 11.1–15.9)
MCH: 28.9 pg (ref 26.6–33.0)
MCHC: 32.6 g/dL (ref 31.5–35.7)
MCV: 89 fL (ref 79–97)
Platelets: 393 10*3/uL (ref 150–450)
RBC: 4.29 x10E6/uL (ref 3.77–5.28)
RDW: 12.6 % (ref 11.7–15.4)
WBC: 6.6 10*3/uL (ref 3.4–10.8)

## 2022-04-07 LAB — COMPREHENSIVE METABOLIC PANEL
ALT: 30 IU/L (ref 0–32)
AST: 21 IU/L (ref 0–40)
Albumin/Globulin Ratio: 1.8 (ref 1.2–2.2)
Albumin: 4.1 g/dL (ref 3.8–4.8)
Alkaline Phosphatase: 106 IU/L (ref 44–121)
BUN/Creatinine Ratio: 21 (ref 12–28)
BUN: 29 mg/dL — ABNORMAL HIGH (ref 8–27)
Bilirubin Total: 0.2 mg/dL (ref 0.0–1.2)
CO2: 22 mmol/L (ref 20–29)
Calcium: 9.8 mg/dL (ref 8.7–10.3)
Chloride: 102 mmol/L (ref 96–106)
Creatinine, Ser: 1.38 mg/dL — ABNORMAL HIGH (ref 0.57–1.00)
Globulin, Total: 2.3 g/dL (ref 1.5–4.5)
Glucose: 167 mg/dL — ABNORMAL HIGH (ref 70–99)
Potassium: 4.5 mmol/L (ref 3.5–5.2)
Sodium: 137 mmol/L (ref 134–144)
Total Protein: 6.4 g/dL (ref 6.0–8.5)
eGFR: 42 mL/min/{1.73_m2} — ABNORMAL LOW (ref >59)

## 2022-04-08 ENCOUNTER — Other Ambulatory Visit (HOSPITAL_BASED_OUTPATIENT_CLINIC_OR_DEPARTMENT_OTHER): Payer: Self-pay | Admitting: Nurse Practitioner

## 2022-04-08 DIAGNOSIS — E1165 Type 2 diabetes mellitus with hyperglycemia: Secondary | ICD-10-CM

## 2022-04-09 ENCOUNTER — Telehealth (HOSPITAL_BASED_OUTPATIENT_CLINIC_OR_DEPARTMENT_OTHER): Payer: Self-pay | Admitting: Nurse Practitioner

## 2022-04-09 NOTE — Telephone Encounter (Signed)
LMOM for patient pertaining to her lab results. Improvement on kidney function and other labs. Suspect dehydration was present. Plan to recheck CMP in 30 days.

## 2022-04-12 ENCOUNTER — Encounter: Payer: Self-pay | Admitting: Orthopaedic Surgery

## 2022-04-12 ENCOUNTER — Ambulatory Visit (INDEPENDENT_AMBULATORY_CARE_PROVIDER_SITE_OTHER): Payer: Medicare Other

## 2022-04-12 ENCOUNTER — Ambulatory Visit: Payer: Medicare Other | Admitting: Orthopaedic Surgery

## 2022-04-12 DIAGNOSIS — Z96641 Presence of right artificial hip joint: Secondary | ICD-10-CM

## 2022-04-12 NOTE — Progress Notes (Signed)
The patient is a very active 66 year old female who is between 7 and 8 months out from a right total hip arthroplasty.  This was done secondary to a displaced femoral neck fracture.  She reports that she is doing well and has good range of motion and strength.  She is back to her swimming routine.  She reports some occasional pain but otherwise has no issues.  Her right operative hip moves smoothly and fluidly.  It is ligamentously stable.  Her leg lengths are equal.  An AP pelvis lateral right hip shows a well-seated total hip arthroplasty with no complicating features.  At this point follow-up for her right hip can be as needed.  I talked to her about the things that we need to bring her back for that hip.  All questions and concerns were answered and addressed.

## 2022-04-12 NOTE — Progress Notes (Signed)
Called pt and scheduled her for 8/28 due to her being out of town and not coming back in town until 8/25.

## 2022-04-13 ENCOUNTER — Other Ambulatory Visit (HOSPITAL_BASED_OUTPATIENT_CLINIC_OR_DEPARTMENT_OTHER): Payer: Self-pay | Admitting: Nurse Practitioner

## 2022-04-19 DIAGNOSIS — R634 Abnormal weight loss: Secondary | ICD-10-CM | POA: Insufficient documentation

## 2022-04-19 NOTE — Assessment & Plan Note (Signed)
Decreased appetite and subsequent unintentional weight loss since initiating Farxiga approximately on 03/09/2022.  Patient was started on medication for reduced kidney function.  She has subsequently stopped this medication under the advisement of her daughter who is a Engineer, civil (consulting).  Unclear if recent urinary tract infection and medication use is could be contributing. At this time unclear if improved management of diabetes, [name], underlying reduction In kidney function, or recent infection or other causative factor for her weight loss.  Recommend further investigation with laboratory work to assess the underlying cause. Okay to continue to hold Farxiga at this time until we can determine if this could be contributing to her symptoms. We will make changes to plan of care based on lab findings.

## 2022-04-19 NOTE — Assessment & Plan Note (Signed)
Chronic kidney disease in the setting of diabetes and hyperlipidemia.  She does have good control over her blood pressure.  She was recently started on Farxiga for kidney protection however she has had an approximate 10 pound weight loss since starting this medication.  Unclear if the symptoms are caused by the medication or current UTI and side effects of medication. We will plan for blood work and UA today to determine if there is any additional causes that could be identified. Given that the patient is experiencing unusual tingling sensation during urination with radiation into the abdomen recommend urology evaluation to help determine causative factor.  She is established with urology.  She may need gynecological evaluation if this is unrevealing. Recommend continuation of antibiotics for urinary tract infection until complete.  We will plan to recheck urine if symptoms persist.

## 2022-04-27 DIAGNOSIS — H5211 Myopia, right eye: Secondary | ICD-10-CM | POA: Diagnosis not present

## 2022-04-30 ENCOUNTER — Other Ambulatory Visit (HOSPITAL_BASED_OUTPATIENT_CLINIC_OR_DEPARTMENT_OTHER): Payer: Self-pay | Admitting: Nurse Practitioner

## 2022-05-01 ENCOUNTER — Other Ambulatory Visit (HOSPITAL_BASED_OUTPATIENT_CLINIC_OR_DEPARTMENT_OTHER): Payer: Self-pay | Admitting: Nurse Practitioner

## 2022-05-01 DIAGNOSIS — E559 Vitamin D deficiency, unspecified: Secondary | ICD-10-CM

## 2022-05-31 ENCOUNTER — Ambulatory Visit (HOSPITAL_BASED_OUTPATIENT_CLINIC_OR_DEPARTMENT_OTHER): Payer: Medicare Other

## 2022-06-04 ENCOUNTER — Other Ambulatory Visit (HOSPITAL_BASED_OUTPATIENT_CLINIC_OR_DEPARTMENT_OTHER): Payer: Self-pay

## 2022-06-04 ENCOUNTER — Ambulatory Visit (HOSPITAL_BASED_OUTPATIENT_CLINIC_OR_DEPARTMENT_OTHER): Payer: Medicare Other

## 2022-06-04 DIAGNOSIS — N289 Disorder of kidney and ureter, unspecified: Secondary | ICD-10-CM

## 2022-06-05 LAB — COMPREHENSIVE METABOLIC PANEL
ALT: 25 IU/L (ref 0–32)
AST: 22 IU/L (ref 0–40)
Albumin/Globulin Ratio: 2 (ref 1.2–2.2)
Albumin: 4.5 g/dL (ref 3.9–4.9)
Alkaline Phosphatase: 89 IU/L (ref 44–121)
BUN/Creatinine Ratio: 17 (ref 12–28)
BUN: 21 mg/dL (ref 8–27)
Bilirubin Total: 0.3 mg/dL (ref 0.0–1.2)
CO2: 23 mmol/L (ref 20–29)
Calcium: 9.7 mg/dL (ref 8.7–10.3)
Chloride: 104 mmol/L (ref 96–106)
Creatinine, Ser: 1.26 mg/dL — ABNORMAL HIGH (ref 0.57–1.00)
Globulin, Total: 2.2 g/dL (ref 1.5–4.5)
Glucose: 149 mg/dL — ABNORMAL HIGH (ref 70–99)
Potassium: 4.6 mmol/L (ref 3.5–5.2)
Sodium: 142 mmol/L (ref 134–144)
Total Protein: 6.7 g/dL (ref 6.0–8.5)
eGFR: 47 mL/min/{1.73_m2} — ABNORMAL LOW (ref >59)

## 2022-07-03 ENCOUNTER — Other Ambulatory Visit (HOSPITAL_BASED_OUTPATIENT_CLINIC_OR_DEPARTMENT_OTHER): Payer: Self-pay | Admitting: Nurse Practitioner

## 2022-07-03 DIAGNOSIS — E1165 Type 2 diabetes mellitus with hyperglycemia: Secondary | ICD-10-CM

## 2022-07-14 ENCOUNTER — Ambulatory Visit (INDEPENDENT_AMBULATORY_CARE_PROVIDER_SITE_OTHER): Payer: Medicare Other | Admitting: Nurse Practitioner

## 2022-07-14 ENCOUNTER — Encounter (HOSPITAL_BASED_OUTPATIENT_CLINIC_OR_DEPARTMENT_OTHER): Payer: Self-pay | Admitting: Nurse Practitioner

## 2022-07-14 VITALS — BP 131/58 | HR 96 | Ht 65.0 in | Wt 147.2 lb

## 2022-07-14 DIAGNOSIS — E890 Postprocedural hypothyroidism: Secondary | ICD-10-CM | POA: Diagnosis not present

## 2022-07-14 DIAGNOSIS — J0110 Acute frontal sinusitis, unspecified: Secondary | ICD-10-CM | POA: Insufficient documentation

## 2022-07-14 DIAGNOSIS — N1832 Chronic kidney disease, stage 3b: Secondary | ICD-10-CM

## 2022-07-14 HISTORY — DX: Acute frontal sinusitis, unspecified: J01.10

## 2022-07-14 LAB — POCT RAPID STREP A (OFFICE): Rapid Strep A Screen: NEGATIVE

## 2022-07-14 MED ORDER — AMOXICILLIN-POT CLAVULANATE 875-125 MG PO TABS: 1.0000 | ORAL_TABLET | Freq: Two times a day (BID) | ORAL | 0 refills | Status: DC

## 2022-07-14 NOTE — Progress Notes (Addendum)
Tollie Eth, DNP, AGNP-c [facility] [address]  [address] [place], [place] [address] [phone]  Subjective:   Bianca Phillips is a 66 y.o. female presents to day for evaluation of: Acute Visit (Patient presents today sore throat x 10 days and sinus pressure)  Upper respiratory symptoms She complains of congestion, coryza, nasal congestion, non productive cough, post nasal drip, and sore throat.with chills. Onset of symptoms was  10 days ago and gradually worsening.She is drinking plenty of fluids.  Past history is significant for no history of pneumonia or bronchitis. Patient is non-smoker   PMH, Medications, and Allergies reviewed and updated in chart as appropriate.   ROS negative except for what is listed in HPI. Objective:  BP (!) 131/58   Pulse 96   Ht 5\' 5"  (1.651 m)   Wt 147 lb 3.2 oz (66.8 kg)   SpO2 99%   BMI 24.50 kg/m  Physical Exam Vitals and nursing note reviewed.  Constitutional:      Appearance: She is ill-appearing.  HENT:     Head: Normocephalic.     Nose: Congestion and rhinorrhea present.     Right Sinus: Maxillary sinus tenderness and frontal sinus tenderness present.     Left Sinus: Maxillary sinus tenderness and frontal sinus tenderness present.     Mouth/Throat:     Pharynx: Posterior oropharyngeal erythema present.  Eyes:     Extraocular Movements: Extraocular movements intact.     Pupils: Pupils are equal, round, and reactive to light.  Cardiovascular:     Rate and Rhythm: Normal rate and regular rhythm.     Pulses: Normal pulses.     Heart sounds: Normal heart sounds.  Pulmonary:     Effort: Pulmonary effort is normal.     Breath sounds: Normal breath sounds. No wheezing.  Musculoskeletal:     Cervical back: No tenderness.  Lymphadenopathy:     Cervical: Cervical adenopathy present.  Skin:    General: Skin is warm and dry.     Capillary Refill: Capillary refill takes less than 2 seconds.   Neurological:     General: No focal deficit present.     Mental Status: She is alert and oriented to person, place, and time.           Assessment & Plan:   Problem List Items Addressed This Visit     Acute non-recurrent frontal sinusitis - Primary    Acute sinus pain and pressure with cough and sore throat present for 10 days. Tenderness on palpation to sinuses with rhinorrhea and erythema to posterior oropharynx. Given the length of time symptoms have been present and the progression of the course, we will begin treatment to day with antibiotic therapy for bacterial suspicion. Encouraged increased hydration, rest, and tylenol for aches and pain. F/U if no improvement in the next 3-4 days.       Relevant Medications   amoxicillin-clavulanate (AUGMENTIN) 875-125 MG tablet   Other Relevant Orders   POCT rapid strep A (Completed)   Chronic kidney disease (CKD) stage G3b/A2, moderately decreased glomerular filtration rate (GFR) between 30-44 mL/min/1.73 square meter and albuminuria creatinine ratio between 30-299 mg/g (HCC)   Relevant Orders   Comprehensive metabolic panel (Completed)   Postoperative hypothyroidism   Relevant Orders   TSH (Completed)   T4, free (Completed)      , DNP, AGNP-c 08/11/2022  2:52 PM    History, Medications, Surgery, SDOH, and Family  History reviewed and updated as appropriate.

## 2022-07-14 NOTE — Patient Instructions (Signed)
I have sent in Augmentin for you to take for your sinuses. Your lungs sound great today.   We will plan to recheck your labs and see how they are looking.

## 2022-07-15 LAB — COMPREHENSIVE METABOLIC PANEL
ALT: 26 IU/L (ref 0–32)
AST: 22 IU/L (ref 0–40)
Albumin/Globulin Ratio: 1.9 (ref 1.2–2.2)
Albumin: 4.5 g/dL (ref 3.9–4.9)
Alkaline Phosphatase: 90 IU/L (ref 44–121)
BUN/Creatinine Ratio: 16 (ref 12–28)
BUN: 20 mg/dL (ref 8–27)
Bilirubin Total: 0.2 mg/dL (ref 0.0–1.2)
CO2: 22 mmol/L (ref 20–29)
Calcium: 10.4 mg/dL — ABNORMAL HIGH (ref 8.7–10.3)
Chloride: 99 mmol/L (ref 96–106)
Creatinine, Ser: 1.24 mg/dL — ABNORMAL HIGH (ref 0.57–1.00)
Globulin, Total: 2.4 g/dL (ref 1.5–4.5)
Glucose: 158 mg/dL — ABNORMAL HIGH (ref 70–99)
Potassium: 4.4 mmol/L (ref 3.5–5.2)
Sodium: 140 mmol/L (ref 134–144)
Total Protein: 6.9 g/dL (ref 6.0–8.5)
eGFR: 47 mL/min/{1.73_m2} — ABNORMAL LOW (ref >59)

## 2022-07-15 LAB — T4, FREE: Free T4: 2.74 ng/dL — ABNORMAL HIGH (ref 0.82–1.77)

## 2022-07-15 LAB — TSH: TSH: 3.78 u[IU]/mL (ref 0.450–4.500)

## 2022-07-28 NOTE — Assessment & Plan Note (Signed)
Acute sinus pain and pressure with cough and sore throat present for 10 days. Tenderness on palpation to sinuses with rhinorrhea and erythema to posterior oropharynx. Given the length of time symptoms have been present and the progression of the course, we will begin treatment to day with antibiotic therapy for bacterial suspicion. Encouraged increased hydration, rest, and tylenol for aches and pain. F/U if no improvement in the next 3-4 days.

## 2022-08-03 ENCOUNTER — Other Ambulatory Visit (HOSPITAL_BASED_OUTPATIENT_CLINIC_OR_DEPARTMENT_OTHER): Payer: Self-pay | Admitting: Nurse Practitioner

## 2022-08-03 DIAGNOSIS — H524 Presbyopia: Secondary | ICD-10-CM | POA: Diagnosis not present

## 2022-08-03 DIAGNOSIS — F411 Generalized anxiety disorder: Secondary | ICD-10-CM

## 2022-09-08 ENCOUNTER — Ambulatory Visit (INDEPENDENT_AMBULATORY_CARE_PROVIDER_SITE_OTHER): Payer: Medicare Other | Admitting: Family Medicine

## 2022-09-08 ENCOUNTER — Encounter: Payer: Self-pay | Admitting: Family Medicine

## 2022-09-08 VITALS — BP 138/68 | HR 108 | Ht 65.0 in | Wt 148.0 lb

## 2022-09-08 DIAGNOSIS — R000 Tachycardia, unspecified: Secondary | ICD-10-CM

## 2022-09-08 DIAGNOSIS — R079 Chest pain, unspecified: Secondary | ICD-10-CM

## 2022-09-08 DIAGNOSIS — M940 Chondrocostal junction syndrome [Tietze]: Secondary | ICD-10-CM

## 2022-09-08 MED ORDER — MELOXICAM 15 MG PO TABS: 7.5000 mg | ORAL_TABLET | Freq: Every day | ORAL | 0 refills | Status: DC

## 2022-09-08 NOTE — Progress Notes (Signed)
Chief Complaint  Patient presents with   Chest Pain    Chest pain in the middle of her chest that radiates to the left that started last night. Has had cough x 4 weeks. Does get some heartburn from time to time and had surgery in the past. Has been eating bad lately.    She has had a cough for 4 weeks, phlegm is clear. She is coughing mainly at night. +PND.  She uses Flonase daily for allergies. Denies any shortness of breath or wheezing.  Last night she had pain while sitting and knitting, between her breasts and slightly higher, and on the left side.. She is tender to touch on the left side of her chest. It hurts some if she takes a deep breath. No radiation of pain to jaw/neck/shoulder. No diaphoresis She didn't try anything for the pain.  She has metoprolol to use prn for palpitatis/tachycardia. She has never taken it.   PMH, PSH, SH reviewed  GAD, HH (s/p Nissen 2021), HLD, DM, thyroid disease, kidney stones Fasting sugars are 100-120.  Lab Results  Component Value Date   HGBA1C 7.4 (H) 03/31/2022   Lab Results  Component Value Date   TSH 3.780 07/14/2022    Outpatient Encounter Medications as of 09/08/2022  Medication Sig Note   Alpha-Lipoic Acid 600 MG TABS Take 1 tablet by mouth every evening.    Ascorbic Acid (VITAMIN C) 1000 MG tablet Take 1,000 mg by mouth daily.    aspirin 81 MG chewable tablet Chew 1 tablet (81 mg total) by mouth 2 (two) times daily.    atorvastatin (LIPITOR) 40 MG tablet TAKE 1 TABLET(40 MG) BY MOUTH AT BEDTIME    cholecalciferol (VITAMIN D3) 25 MCG (1000 UNIT) tablet Take 1,000 Units by mouth daily.    docusate sodium (COLACE) 100 MG capsule Take 200 mg by mouth daily.    fluticasone (FLONASE) 50 MCG/ACT nasal spray Place 2 sprays into both nostrils daily.    levothyroxine (SYNTHROID) 150 MCG tablet TAKE 1 TABLET(150 MCG) BY MOUTH DAILY BEFORE BREAKFAST    metFORMIN (GLUCOPHAGE) 500 MG tablet TAKE 1 AND 1/2 TABLETS BY MOUTH DAILY, WITH  BREAKFAST, AND 1 TABLET EVERY EVENING    Misc Natural Products (GLUCOSAMINE CHOND CMP ADVANCED PO) Take 2 capsules by mouth every evening.    nortriptyline (PAMELOR) 50 MG capsule TAKE 1 CAPSULE(50 MG) BY MOUTH AT BEDTIME    Omega-3 Fatty Acids (OMEGA-3 FISH OIL PO) Take 2,000 capsules by mouth every evening.    Probiotic Product (PROBIOTIC DAILY) CAPS Take 1 capsule by mouth every evening.    sertraline (ZOLOFT) 50 MG tablet TAKE 1 TABLET(50 MG) BY MOUTH DAILY    tamsulosin (FLOMAX) 0.4 MG CAPS capsule Take 1 capsule (0.4 mg total) by mouth daily.    trimethoprim (TRIMPEX) 100 MG tablet Take 100 mg by mouth at bedtime. 08/29/2021: Pt on this cont. However, while on azithromycin patient was told to stop.    Turmeric 500 MG CAPS Take 1 capsule by mouth every evening.    [DISCONTINUED] cetirizine (ZYRTEC) 10 MG tablet Take 10 mg by mouth daily.    ALPRAZolam (XANAX) 0.25 MG tablet TAKE 1 TABLET(0.25 MG) BY MOUTH AT BEDTIME AS NEEDED FOR ANXIETY. DO NOT TAKE WITH PAIN MEDICATION OR OTHER MEDICATION THAT. MAY CAUSE DROWSINESS (Patient not taking: Reported on 09/08/2022) 09/08/2022: prn   HYDROcodone-acetaminophen (NORCO/VICODIN) 5-325 MG tablet Take 1-2 tablets by mouth every 6 (six) hours as needed for moderate pain. (Patient not taking: Reported  on 09/08/2022) 09/08/2022: prn   meclizine (ANTIVERT) 12.5 MG tablet Take 12.5 mg by mouth every 6 (six) hours as needed for dizziness. (Patient not taking: Reported on 09/08/2022) 09/08/2022: prn   methocarbamol (ROBAXIN) 500 MG tablet Take 1 tablet (500 mg total) by mouth every 6 (six) hours as needed for muscle spasms. (Patient not taking: Reported on 09/08/2022) 09/08/2022: prn   metoprolol tartrate (LOPRESSOR) 25 MG tablet Take 1 tablet (25 mg total) by mouth 2 (two) times daily as needed (palpitations). (Patient not taking: Reported on 09/08/2022) 09/08/2022: prn   polyethylene glycol (MIRALAX) 17 g packet Take 17 g by mouth daily. (Patient not taking: Reported on  09/08/2022) 09/08/2022: prn   [DISCONTINUED] amoxicillin-clavulanate (AUGMENTIN) 875-125 MG tablet Take 1 tablet by mouth 2 (two) times daily.    [DISCONTINUED] glucose blood (ACCU-CHEK AVIVA PLUS) test strip USE TO CHECK BLOOD SUGAR EVERY DAY (Patient not taking: Reported on 09/08/2022)    [DISCONTINUED] hydrOXYzine (VISTARIL) 50 MG capsule Take 1 capsule (50 mg total) by mouth at bedtime as needed and may repeat dose one time if needed. (Patient not taking: Reported on 09/08/2022)    [DISCONTINUED] mometasone (NASONEX) 50 MCG/ACT nasal spray One spray in each nostril twice a day, use left hand for right nostril, and right hand for left nostril.  Please dispense one bottle.    [DISCONTINUED] Vitamin D, Ergocalciferol, (DRISDOL) 1.25 MG (50000 UNIT) CAPS capsule TAKE 1 CAPSULE BY MOUTH EVERY 7 DAYS FOR 12 WEEKS THEN SWITCH TO OTC 1000 UNITS DAILY    No facility-administered encounter medications on file as of 09/08/2022.   Takes aspirin just once daily, not twice.  Allergies  Allergen Reactions   Curare [Tubocurarine] Other (See Comments)    Per pt was awake and aware that she had paralysis and could not breath   ROS: no fever, chills.  No HA, dizziness, palpitations, shortness of breath.  No n/v/d, slight constipation. Allergies and cough per HPI. Chest pain per HPI.   No bleeding, rash, edema or other concerns. +anxiety   PHYSICAL EXAM:  BP (!) 170/90   Pulse (!) 108   Ht 5\' 5"  (1.651 m)   Wt 148 lb (67.1 kg)   BMI 24.63 kg/m   138/68 on repeat by MD, persistent tachycardia  Wt Readings from Last 3 Encounters:  09/08/22 148 lb (67.1 kg)  07/14/22 147 lb 3.2 oz (66.8 kg)  03/30/22 135 lb (61.2 kg)   Anxious female, in no distress HEENT: conjunctiva and sclera are clear, EOMI. TM's and EAC's are normal. Nasal mucosa is mildly edematous, moderate on the left, with clear mucus. OP is clear. Sinuses nontender Neck: no lymphadenopathy or mass Heart: Tachycardic, no murmur Chest  wall:  tender to palpation along L sided costochondral junctions x 3-4 levels. Nontender on the right. Lungs: clear bilaterally Abdomen: soft, nontender, no organomegaly or mass Extremities: no edema Psych: anxious, moving her leg around during most of the visit.  Neuro: alert and oriented, crana  EKG: NSR, rate 99.  Poor RWP--lead V3 only one different from EKG 08/2021.  No acute changes.  ASSESSMENT/PLAN:  Acute costochondritis - Plan: meloxicam (MOBIC) 15 MG tablet  Chest pain, unspecified type - Plan: EKG 12-Lead  Tachycardia - today suspect it is related to anxiety. Has metoprolol to use for more severe tachycardia/palpitations  66 yo diabetic, with HTN, HLD, thyroid dz, with risk factors for CV disease. Chest pain today is reproducible. No acute findings on EKG. Trial of NSAID and moist heat  vs ice. F/u if sx persist or worsen.   Stay well hydrated. Continue the flonase daily--be sure to use just gentle sniffs. Since you do have a lot of nasal mucus, which is likely the cause of the postnasal drainage and cough, you may want to add an antihistamine such as claritin, allegra or zyrtec (generics are fine). If you are still coughing a lot, you can try taking Mucinex DM (at bedtime, or twice daily if it is a 12 hour tablet).  Use moist heat to your chest wall. Take the prescribed meloxicam once daily with food. If it bothers your stomach, cut the dose in half. Take it every day until your pain has resolved (you do not need to finish the bottle, like you would if it was an antibiotic).  Do not take any over-the-counter ibuprofen/advil/motrin, naproxen/aleve, Goody or BC powder, or any extra aspirin (just your 81mg  once daily). If you have pain despite taking the prescription anti-inflammatory, use tylenol product.

## 2022-09-08 NOTE — Patient Instructions (Signed)
  Stay well hydrated. Continue the flonase daily--be sure to use just gentle sniffs. Since you do have a lot of nasal mucus, which is likely the cause of the postnasal drainage and cough, you may want to add an antihistamine such as claritin, allegra or zyrtec (generics are fine). If you are still coughing a lot, you can try taking Mucinex DM (at bedtime, or twice daily if it is a 12 hour tablet).  Use moist heat to your chest wall. Take the prescribed meloxicam once daily with food. If it bothers your stomach, cut the dose in half. Take it every day until your pain has resolved (you do not need to finish the bottle, like you would if it was an antibiotic).  Do not take any over-the-counter ibuprofen/advil/motrin, naproxen/aleve, Goody or BC powder, or any extra aspirin (just your 81mg  once daily). If you have pain despite taking the prescription anti-inflammatory, use tylenol product.

## 2022-10-09 ENCOUNTER — Other Ambulatory Visit (HOSPITAL_BASED_OUTPATIENT_CLINIC_OR_DEPARTMENT_OTHER): Payer: Self-pay | Admitting: Nurse Practitioner

## 2022-10-11 NOTE — Telephone Encounter (Signed)
Refill request last filled 04/16/22 last apt 09/08/22.

## 2022-10-12 ENCOUNTER — Encounter: Payer: Self-pay | Admitting: Internal Medicine

## 2022-10-26 ENCOUNTER — Ambulatory Visit (INDEPENDENT_AMBULATORY_CARE_PROVIDER_SITE_OTHER): Payer: PPO | Admitting: Nurse Practitioner

## 2022-10-26 ENCOUNTER — Encounter: Payer: Self-pay | Admitting: Nurse Practitioner

## 2022-10-26 VITALS — BP 130/60 | HR 94 | Ht 65.0 in | Wt 148.0 lb

## 2022-10-26 DIAGNOSIS — E785 Hyperlipidemia, unspecified: Secondary | ICD-10-CM

## 2022-10-26 DIAGNOSIS — Z13820 Encounter for screening for osteoporosis: Secondary | ICD-10-CM

## 2022-10-26 DIAGNOSIS — E1169 Type 2 diabetes mellitus with other specified complication: Secondary | ICD-10-CM

## 2022-10-26 DIAGNOSIS — G4700 Insomnia, unspecified: Secondary | ICD-10-CM

## 2022-10-26 DIAGNOSIS — Z1211 Encounter for screening for malignant neoplasm of colon: Secondary | ICD-10-CM

## 2022-10-26 DIAGNOSIS — N1832 Chronic kidney disease, stage 3b: Secondary | ICD-10-CM

## 2022-10-26 DIAGNOSIS — E890 Postprocedural hypothyroidism: Secondary | ICD-10-CM | POA: Diagnosis not present

## 2022-10-26 DIAGNOSIS — H66002 Acute suppurative otitis media without spontaneous rupture of ear drum, left ear: Secondary | ICD-10-CM

## 2022-10-26 DIAGNOSIS — E782 Mixed hyperlipidemia: Secondary | ICD-10-CM | POA: Diagnosis not present

## 2022-10-26 DIAGNOSIS — N898 Other specified noninflammatory disorders of vagina: Secondary | ICD-10-CM

## 2022-10-26 DIAGNOSIS — F320 Major depressive disorder, single episode, mild: Secondary | ICD-10-CM | POA: Diagnosis not present

## 2022-10-26 DIAGNOSIS — E1165 Type 2 diabetes mellitus with hyperglycemia: Secondary | ICD-10-CM | POA: Diagnosis not present

## 2022-10-26 DIAGNOSIS — F411 Generalized anxiety disorder: Secondary | ICD-10-CM

## 2022-10-26 MED ORDER — METFORMIN HCL 500 MG PO TABS: ORAL_TABLET | ORAL | 3 refills | Status: DC

## 2022-10-26 MED ORDER — LEVOTHYROXINE SODIUM 150 MCG PO TABS: ORAL_TABLET | ORAL | 3 refills | Status: DC

## 2022-10-26 MED ORDER — TRIMETHOPRIM 100 MG PO TABS: 100.0000 mg | ORAL_TABLET | Freq: Every day | ORAL | 1 refills | Status: DC

## 2022-10-26 MED ORDER — ATORVASTATIN CALCIUM 40 MG PO TABS: ORAL_TABLET | ORAL | 3 refills | Status: DC

## 2022-10-26 MED ORDER — SERTRALINE HCL 50 MG PO TABS: ORAL_TABLET | ORAL | 3 refills | Status: DC

## 2022-10-26 MED ORDER — AZITHROMYCIN 250 MG PO TABS: ORAL_TABLET | ORAL | 0 refills | Status: AC

## 2022-10-26 MED ORDER — LANCET DEVICE MISC: 1.0000 | Freq: Three times a day (TID) | 0 refills | Status: AC

## 2022-10-26 MED ORDER — LANCETS MISC. MISC: 1.0000 | Freq: Three times a day (TID) | 99 refills | Status: AC

## 2022-10-26 MED ORDER — BLOOD GLUCOSE TEST VI STRP: 1.0000 | ORAL_STRIP | Freq: Three times a day (TID) | 99 refills | Status: AC

## 2022-10-26 MED ORDER — BLOOD GLUCOSE MONITORING SUPPL DEVI: 1.0000 | Freq: Three times a day (TID) | 0 refills | Status: AC

## 2022-10-26 MED ORDER — FLUCONAZOLE 150 MG PO TABS: ORAL_TABLET | ORAL | 2 refills | Status: DC

## 2022-10-26 MED ORDER — NORTRIPTYLINE HCL 50 MG PO CAPS: ORAL_CAPSULE | ORAL | 3 refills | Status: DC

## 2022-10-26 NOTE — Progress Notes (Signed)
[facility]  MEDICARE [place] VISIT  11/02/2022  Subjective:  Bianca Phillips is a 67 y.o. female patient of Bianca Phillips, Sung Amabile, NP who had a Medicare Annual Wellness Visit today. Bianca Phillips is Retired and lives alone. she has adult children. she reports that she is socially active and does interact with friends/family regularly. she is moderately physically active.  Patient Care Team: Rosemaria Inabinet, Sung Amabile, NP as PCP - General (Nurse Practitioner) Crista Elliot, MD as Consulting Physician (Urology)     03/12/2022    3:49 AM 08/29/2021   12:12 PM 07/20/2021    1:14 PM 03/09/2021   11:10 AM  Advanced Directives  Does Patient Have a Medical Advance Directive? No No No No  Would patient like information on creating a medical advance directive? No - Patient declined No - Patient declined Yes (MAU/Ambulatory/Procedural Areas - Information given)     Hospital Utilization Over the Past 12 Months: # of hospitalizations or ER visits: 0 # of surgeries: 0  Review of Systems    Patient reports that her overall health is unchanged when compared to last year.  Review of Systems: History obtained from the patient General ROS: negative  All other systems negative.  Pain Assessment Pain : No/denies pain0/10     Current Medications & Allergies (verified) Allergies as of 10/26/2022       Reactions   Curare [tubocurarine] Other (See Comments)   Per pt was awake and aware that she had paralysis and could not breath        Medication List        Accurate as of October 26, 2022 11:59 PM. If you have any questions, ask your nurse or doctor.          STOP taking these medications    fluticasone 50 MCG/ACT nasal spray Commonly known as: FLONASE Stopped by: Tollie Eth, NP   HYDROcodone-acetaminophen 5-325 MG tablet Commonly known as: NORCO/VICODIN Stopped by: Tollie Eth, NP   meclizine 12.5 MG tablet Commonly known as: ANTIVERT Stopped by: Tollie Eth, NP    meloxicam 15 MG tablet Commonly known as: MOBIC Stopped by: Tollie Eth, NP   methocarbamol 500 MG tablet Commonly known as: ROBAXIN Stopped by: Tollie Eth, NP   metoprolol tartrate 25 MG tablet Commonly known as: LOPRESSOR Stopped by: Tollie Eth, NP   polyethylene glycol 17 g packet Commonly known as: MiraLax Stopped by: Tollie Eth, NP   tamsulosin 0.4 MG Caps capsule Commonly known as: Flomax Stopped by: Tollie Eth, NP       TAKE these medications    Alpha-Lipoic Acid 600 MG Tabs Take 1 tablet by mouth every evening.   ALPRAZolam 0.25 MG tablet Commonly known as: XANAX TAKE 1 TABLET(0.25 MG) BY MOUTH AT BEDTIME AS NEEDED FOR ANXIETY. DO NOT TAKE WITH PAIN MEDICATION OR OTHER MEDICATION THAT. MAY CAUSE DROWSINESS   aspirin 81 MG chewable tablet Chew 1 tablet (81 mg total) by mouth 2 (two) times daily.   atorvastatin 40 MG tablet Commonly known as: LIPITOR TAKE 1 TABLET(40 MG) BY MOUTH AT BEDTIME   azithromycin 250 MG tablet Commonly known as: ZITHROMAX Take 2 tablets on day 1, then 1 tablet daily on days 2 through 5 Started by: Tollie Eth, NP   Blood Glucose Monitoring Suppl Devi 1 each by Does not apply route in the morning, at noon, and at bedtime. May substitute to any manufacturer covered by patient's insurance.  Started by: Tollie Eth, NP   BLOOD GLUCOSE TEST STRIPS Strp 1 each by In Vitro route in the morning, at noon, and at bedtime. May substitute to any manufacturer covered by patient's insurance. Started by: Tollie Eth, NP   cholecalciferol 25 MCG (1000 UNIT) tablet Commonly known as: VITAMIN D3 Take 1,000 Units by mouth daily.   docusate sodium 100 MG capsule Commonly known as: COLACE Take 200 mg by mouth daily.   fluconazole 150 MG tablet Commonly known as: DIFLUCAN Take one tablet by mouth at the first sign of symptoms of yeast. If no resolution, repeat dose in 72 hours. Started by: Tollie Eth, NP   GLUCOSAMINE  CHOND CMP ADVANCED PO Take 2 capsules by mouth every evening.   Lancet Device Misc 1 each by Does not apply route in the morning, at noon, and at bedtime. May substitute to any manufacturer covered by patient's insurance. Started by: Tollie Eth, NP   Lancets Misc. Misc 1 each by Does not apply route in the morning, at noon, and at bedtime. May substitute to any manufacturer covered by patient's insurance. Started by: Tollie Eth, NP   levothyroxine 150 MCG tablet Commonly known as: SYNTHROID TAKE 1 TABLET(150 MCG) BY MOUTH DAILY BEFORE BREAKFAST   metFORMIN 500 MG tablet Commonly known as: GLUCOPHAGE TAKE 1 AND 1/2 TABLETS BY MOUTH DAILY, WITH BREAKFAST, AND 1 TABLET EVERY EVENING   nortriptyline 50 MG capsule Commonly known as: PAMELOR TAKE 1 CAPSULE(50 MG) BY MOUTH AT BEDTIME   OMEGA-3 FISH OIL PO Take 2,000 capsules by mouth every evening.   Probiotic Daily Caps Take 1 capsule by mouth every evening.   sertraline 50 MG tablet Commonly known as: ZOLOFT TAKE 1 TABLET(50 MG) BY MOUTH DAILY   trimethoprim 100 MG tablet Commonly known as: TRIMPEX Take 1 tablet (100 mg total) by mouth at bedtime.   Turmeric 500 MG Caps Take 1 capsule by mouth every evening.   vitamin C 1000 MG tablet Take 1,000 mg by mouth daily.        History (reviewed): Past Medical History:  Diagnosis Date   Complication of anesthesia    Constipation    GAD (generalized anxiety disorder)    Heart murmur    Heart palpitations    evaluated by cardiologist--- dr h. Shari Prows, note in epic 04-03-2021, normal echo and monitor showed NSR w/ SVT   History of COVID-19    per pt had covid twice in 12/ 2020 with mild symptoms with residual intermittant loss of taste;  and 04/ 2022 with mild symptoms that resolved   History of hyperthyroidism    age 49 s/p RAI   History of kidney stones    History of recurrent UTIs    History of repair of hiatal hernia 02/2020   Hyperlipidemia     Hypothyroidism, postablative    age 22  s/p RAI for hyperthyroidism;  followed by pcp   IDA (iron deficiency anemia)    MDD (major depressive disorder)    OA (osteoarthritis)    Right ureteral calculus    Seasonal allergic rhinitis    Type 2 diabetes mellitus (HCC)    followed by pcp    (07-14-2021  per pt checks blood sugar at home 2-3 times weekly,  fasting sugar --120-130)   Urgency of urination    Wears hearing aid in both ears    Past Surgical History:  Procedure Laterality Date   CESAREAN SECTION     x2  last one 1986   CYSTOSCOPY/URETEROSCOPY/HOLMIUM LASER/STENT PLACEMENT  2012   approx   CYSTOSCOPY/URETEROSCOPY/HOLMIUM LASER/STENT PLACEMENT Right 07/20/2021   Procedure: CYSTOSCOPY/RETROGRADE/URETEROSCOPY/HOLMIUM LASER/STENT PLACEMENT;  Surgeon: Jannifer Hick, MD;  Location: [facility];  Service: Urology;  Laterality: Right;  ONLY NEEDS 45 MIN   LAPAROSCOPIC NISSEN FUNDOPLICATION  02/2020   TOTAL ABDOMINAL HYSTERECTOMY W/ BILATERAL SALPINGOOPHORECTOMY  2002   TOTAL HIP ARTHROPLASTY Right 08/30/2021   Procedure: TOTAL HIP ARTHROPLASTY ANTERIOR APPROACH;  Surgeon: Kathryne Hitch, MD;  Location: [facility] OR;  Service: Orthopedics;  Laterality: Right;   History reviewed. No pertinent family history. Social History   Socioeconomic History   Marital status: Divorced    Spouse name: Not on file   Number of children: 3   Years of education: 16   Highest education level: Bachelor's degree (e.g., BA, AB, BS)  Occupational History   Occupation: retired    Comment: Runner, broadcasting/film/video  Tobacco Use   Smoking status: Former    Packs/day: 0.50    Years: 10.00    Total pack years: 5.00    Types: Cigarettes    Quit date: 1988    Years since quitting: 36.1   Smokeless tobacco: Never  Vaping Use   Vaping Use: Never used  Substance and Sexual Activity   Alcohol use: Not Currently   Drug use: Never   Sexual activity: Not Currently    Birth control/protection:  Post-menopausal, Surgical  Other Topics Concern   Not on file  Social History Narrative   Not on file   Social Determinants of Health   Financial Resource Strain: Low Risk  (11/02/2022)   Overall Financial Resource Strain (CARDIA)    Difficulty of Paying Living Expenses: Not hard at all  Food Insecurity: No Food Insecurity (11/02/2022)   Hunger Vital Sign    Worried About Running Out of Food in the Last Year: Never true    Ran Out of Food in the Last Year: Never true  Transportation Needs: No Transportation Needs (11/02/2022)   PRAPARE - Administrator, Civil Service (Medical): No    Lack of Transportation (Non-Medical): No  Physical Activity: Insufficiently Active (11/02/2022)   Exercise Vital Sign    Days of Exercise per Week: 4 days    Minutes of Exercise per Session: 30 min  Stress: Stress Concern Present (11/02/2022)   [employer] of Occupational Health - Occupational Stress Questionnaire    Feeling of Stress : To some extent  Social Connections: Moderately Integrated (11/02/2022)   Social Connection and Isolation Panel [NHANES]    Frequency of Communication with Friends and Family: More than three times a week    Frequency of Social Gatherings with Friends and Family: More than three times a week    Attends Religious Services: More than 4 times per year    Active Member of [employer] or Organizations: Yes    Attends Banker Meetings: More than 4 times per year    Marital Status: Divorced    Activities of Daily Living    01/28/2022    4:07 PM  In your present state of health, do you have any difficulty performing the following activities:  Hearing? 0  Vision? 0  Difficulty concentrating or making decisions? 0  Walking or climbing stairs? 0  Dressing or bathing? 0  Doing errands, shopping? 0    Patient Education/Literacy How often do you need to have someone help you when you read instructions, pamphlets, or other written materials from your  doctor or pharmacy?: 1 - Never  Exercise    Diet Patient reports consuming 3 meals a day and 1-2 snack(s) a day Patient reports that her primary diet is: Regular Patient reports that she does have regular access to food.   Depression Screen    10/26/2022    2:42 PM 01/28/2022    4:08 PM 02/05/2021    1:31 PM  PHQ 2/9 Scores  PHQ - 2 Score 0 0 0  PHQ- 9 Score   1  Exception Documentation  Medical reason      Fall Risk    10/26/2022    2:42 PM 01/28/2022    4:08 PM 02/05/2021    1:30 PM  [place] in the past year? 1 1 1   Number falls in past yr: 0 0 0  Injury with Fall? 0 1 1  Risk for fall due to : Impaired balance/gait Orthopedic patient No Fall Risks  Follow up Falls evaluation completed Falls evaluation completed;Education provided      Objective:   BP 130/60   Pulse 94   Ht 5\' 5"  (1.651 m)   Wt 148 lb (67.1 kg)   BMI 24.63 kg/m   Last Weight  Most recent update: 10/26/2022  2:46 PM    Weight  67.1 kg (148 lb)             Body mass index is 24.63 kg/m.  Hearing/Vision  Shawntia did not have difficulty with hearing/understanding during the face-to-face interview Sherleen did not have difficulty with her vision during the face-to-face interview Reports that she has had a formal eye exam by an eye care professional within the past year Reports that she has not had a formal hearing evaluation within the past year  Cognitive Function:    11/02/2022    9:44 PM  6CIT Screen  What Year? 0 points  What month? 0 points  What time? 0 points  Count back from 20 0 points  Months in reverse 0 points  Repeat phrase 0 points  Total Score 0 points    Normal Cognitive Function Screening: Yes (Normal:0-7, Significant for Dysfunction: >8)  Immunization & Health Maintenance Record Immunization History  Administered Date(s) Administered   Pneumococcal Conjugate,unspecified 08/05/2015    Health Maintenance  Topic Date Due   Medicare Annual Wellness (AWV)   Never done   COLONOSCOPY (Pts 45-41yrs Insurance coverage will need to be confirmed)  Never done   Pneumonia Vaccine 25+ Years old (1 - PCV) 02/27/2019   DEXA SCAN  Never done   Diabetic kidney evaluation - Urine ACR  02/25/2022   COVID-19 Vaccine (1) 11/11/2022 (Originally 08/29/1956)   INFLUENZA VACCINE  01/02/2023 (Originally 05/04/2022)   Zoster Vaccines- Shingrix (1 of 2) 01/25/2023 (Originally 02/27/2004)   HEMOGLOBIN A1C  04/27/2023   OPHTHALMOLOGY EXAM  04/28/2023   MAMMOGRAM  10/16/2023   FOOT EXAM  10/27/2023   Diabetic kidney evaluation - eGFR measurement  10/28/2023   HPV VACCINES  Aged Out   DTaP/Tdap/Td  Discontinued   Hepatitis C Screening  Discontinued       Assessment  This is a routine wellness examination for [employer].  Health Maintenance: Due or Overdue Health Maintenance Due  Topic Date Due   Medicare Annual Wellness (AWV)  Never done   COLONOSCOPY (Pts 45-44yrs Insurance coverage will need to be confirmed)  Never done   Pneumonia Vaccine 20+ Years old (1 - PCV) 02/27/2019   DEXA SCAN  Never done  Diabetic kidney evaluation - Urine ACR  02/25/2022    Britt Boozer does not need a referral for Community Assistance: Care Management:   not applicable Social Work:    not applicable Prescription Assistance:  not applicable Nutrition/Diabetes Education:  not applicable   Plan:  Personalized Goals  Goals Addressed             This Visit's Progress    Eat Healthy       Timeframe:  Long-Range Goal Priority:  High                  - drink 6 to 8 glasses of water each day - manage portion size - set a realistic goal    Why is this important?   When you are ready to manage your nutrition or weight, having a plan and setting goals will help.  Taking small steps to change how you eat and exercise is a good place to start.           Personalized Health Maintenance & Screening Recommendations   Lung Cancer Screening Recommended: not  applicable (Low Dose CT Chest recommended if Age 86-80 years, 30 pack-year currently smoking OR have quit w/in past 15 years) Hepatitis C Screening recommended: not applicable HIV Screening recommended: not applicable  Advanced Directives: Written information was not given per the patient's request.  Referrals & Orders Orders Placed This Encounter  Procedures   DG Bone Density   CBC with Differential/Platelet   Comprehensive metabolic panel   Hemoglobin A1c   Lipid panel   TSH   T4, free   VITAMIN D 25 Hydroxy (Vit-D Deficiency, Fractures)   Iron, TIBC and Ferritin Panel   Ambulatory referral to Gastroenterology   Ambulatory referral to Urology   POCT UA - Microalbumin    Follow-up Plan Follow-up with Jhamir Pickup, Coralee Pesa, NP as planned    I have personally reviewed and noted the following in the patient's chart:   Medical and social history Use of alcohol, tobacco or illicit drugs  Current medications and supplements Functional ability and status Nutritional status Physical activity Advanced directives List of other physicians Hospitalizations, surgeries, and ER visits in previous 12 months Vitals Screenings to include cognitive, depression, and falls Referrals and appointments  In addition, I have reviewed and discussed with patient certain preventive protocols, quality metrics, and best practice recommendations. A written personalized care plan for preventive services as well as general preventive health recommendations were provided to patient.     Orma Render, DNP, AGNP-c   11/02/2022

## 2022-10-26 NOTE — Patient Instructions (Addendum)
I want you to try the magnesium to see if this helps you sleep at night and with your constipation.   I have sent in refills for your medications to the new pharmacy.   I have sent the referral for the urology, for the colonoscopy and endoscopy.  I have also sent in for your mammogram and DEXA (bone density scan)

## 2022-10-27 ENCOUNTER — Other Ambulatory Visit: Payer: PPO

## 2022-10-27 DIAGNOSIS — E785 Hyperlipidemia, unspecified: Secondary | ICD-10-CM | POA: Diagnosis not present

## 2022-10-27 DIAGNOSIS — E890 Postprocedural hypothyroidism: Secondary | ICD-10-CM | POA: Diagnosis not present

## 2022-10-27 DIAGNOSIS — E1165 Type 2 diabetes mellitus with hyperglycemia: Secondary | ICD-10-CM | POA: Diagnosis not present

## 2022-10-27 DIAGNOSIS — N1832 Chronic kidney disease, stage 3b: Secondary | ICD-10-CM | POA: Diagnosis not present

## 2022-10-27 DIAGNOSIS — G4700 Insomnia, unspecified: Secondary | ICD-10-CM | POA: Diagnosis not present

## 2022-10-27 DIAGNOSIS — F411 Generalized anxiety disorder: Secondary | ICD-10-CM | POA: Diagnosis not present

## 2022-10-27 DIAGNOSIS — E1169 Type 2 diabetes mellitus with other specified complication: Secondary | ICD-10-CM | POA: Diagnosis not present

## 2022-10-28 LAB — CBC WITH DIFFERENTIAL/PLATELET
Basophils Absolute: 0 10*3/uL (ref 0.0–0.2)
Basos: 0 %
EOS (ABSOLUTE): 0.1 10*3/uL (ref 0.0–0.4)
Eos: 2 %
Hematocrit: 40.8 % (ref 34.0–46.6)
Hemoglobin: 14 g/dL (ref 11.1–15.9)
Immature Grans (Abs): 0 10*3/uL (ref 0.0–0.1)
Immature Granulocytes: 0 %
Lymphocytes Absolute: 2 10*3/uL (ref 0.7–3.1)
Lymphs: 36 %
MCH: 31.1 pg (ref 26.6–33.0)
MCHC: 34.3 g/dL (ref 31.5–35.7)
MCV: 91 fL (ref 79–97)
Monocytes Absolute: 0.4 10*3/uL (ref 0.1–0.9)
Monocytes: 7 %
Neutrophils Absolute: 2.9 10*3/uL (ref 1.4–7.0)
Neutrophils: 55 %
Platelets: 240 10*3/uL (ref 150–450)
RBC: 4.5 x10E6/uL (ref 3.77–5.28)
RDW: 12.4 % (ref 11.7–15.4)
WBC: 5.4 10*3/uL (ref 3.4–10.8)

## 2022-10-28 LAB — COMPREHENSIVE METABOLIC PANEL
ALT: 25 IU/L (ref 0–32)
AST: 20 IU/L (ref 0–40)
Albumin/Globulin Ratio: 2 (ref 1.2–2.2)
Albumin: 4.6 g/dL (ref 3.9–4.9)
Alkaline Phosphatase: 92 IU/L (ref 44–121)
BUN/Creatinine Ratio: 21 (ref 12–28)
BUN: 26 mg/dL (ref 8–27)
Bilirubin Total: 0.4 mg/dL (ref 0.0–1.2)
CO2: 25 mmol/L (ref 20–29)
Calcium: 10.1 mg/dL (ref 8.7–10.3)
Chloride: 103 mmol/L (ref 96–106)
Creatinine, Ser: 1.23 mg/dL — ABNORMAL HIGH (ref 0.57–1.00)
Globulin, Total: 2.3 g/dL (ref 1.5–4.5)
Glucose: 162 mg/dL — ABNORMAL HIGH (ref 70–99)
Potassium: 4.9 mmol/L (ref 3.5–5.2)
Sodium: 143 mmol/L (ref 134–144)
Total Protein: 6.9 g/dL (ref 6.0–8.5)
eGFR: 48 mL/min/{1.73_m2} — ABNORMAL LOW (ref >59)

## 2022-10-28 LAB — T4, FREE: Free T4: 3.11 ng/dL — ABNORMAL HIGH (ref 0.82–1.77)

## 2022-10-28 LAB — IRON,TIBC AND FERRITIN PANEL
Ferritin: 33 ng/mL (ref 15–150)
Iron Saturation: 32 % (ref 15–55)
Iron: 112 ug/dL (ref 27–139)
Total Iron Binding Capacity: 350 ug/dL (ref 250–450)
UIBC: 238 ug/dL (ref 118–369)

## 2022-10-28 LAB — LIPID PANEL
Chol/HDL Ratio: 4.1 ratio (ref 0.0–4.4)
Cholesterol, Total: 162 mg/dL (ref 100–199)
HDL: 40 mg/dL (ref >39)
LDL Chol Calc (NIH): 83 mg/dL (ref 0–99)
Triglycerides: 236 mg/dL — ABNORMAL HIGH (ref 0–149)
VLDL Cholesterol Cal: 39 mg/dL (ref 5–40)

## 2022-10-28 LAB — HEMOGLOBIN A1C
Est. average glucose Bld gHb Est-mCnc: 143 mg/dL
Hgb A1c MFr Bld: 6.6 % — ABNORMAL HIGH (ref 4.8–5.6)

## 2022-10-28 LAB — VITAMIN D 25 HYDROXY (VIT D DEFICIENCY, FRACTURES): Vit D, 25-Hydroxy: 38.1 ng/mL (ref 30.0–100.0)

## 2022-10-28 LAB — TSH: TSH: 3.89 u[IU]/mL (ref 0.450–4.500)

## 2022-10-29 DIAGNOSIS — N200 Calculus of kidney: Secondary | ICD-10-CM | POA: Diagnosis not present

## 2022-11-04 ENCOUNTER — Telehealth: Payer: Self-pay | Admitting: Nurse Practitioner

## 2022-11-04 ENCOUNTER — Other Ambulatory Visit: Payer: Self-pay

## 2022-11-04 DIAGNOSIS — N1832 Chronic kidney disease, stage 3b: Secondary | ICD-10-CM

## 2022-11-04 NOTE — Telephone Encounter (Signed)
Urology Referral.  

## 2022-11-25 ENCOUNTER — Other Ambulatory Visit: Payer: Self-pay | Admitting: Nurse Practitioner

## 2022-11-25 DIAGNOSIS — Z1231 Encounter for screening mammogram for malignant neoplasm of breast: Secondary | ICD-10-CM

## 2022-11-25 DIAGNOSIS — Z13820 Encounter for screening for osteoporosis: Secondary | ICD-10-CM

## 2022-11-29 ENCOUNTER — Encounter: Payer: Self-pay | Admitting: Physician Assistant

## 2022-12-20 ENCOUNTER — Ambulatory Visit (INDEPENDENT_AMBULATORY_CARE_PROVIDER_SITE_OTHER): Payer: HMO | Admitting: Family Medicine

## 2022-12-20 ENCOUNTER — Encounter: Payer: Self-pay | Admitting: Family Medicine

## 2022-12-20 VITALS — BP 120/60 | HR 88 | Temp 97.7°F | Ht 65.0 in | Wt 147.0 lb

## 2022-12-20 DIAGNOSIS — N898 Other specified noninflammatory disorders of vagina: Secondary | ICD-10-CM

## 2022-12-20 DIAGNOSIS — R350 Frequency of micturition: Secondary | ICD-10-CM | POA: Diagnosis not present

## 2022-12-20 DIAGNOSIS — R102 Pelvic and perineal pain: Secondary | ICD-10-CM | POA: Diagnosis not present

## 2022-12-20 LAB — POCT URINALYSIS DIP (PROADVANTAGE DEVICE)
Bilirubin, UA: NEGATIVE
Blood, UA: NEGATIVE
Glucose, UA: NEGATIVE mg/dL
Ketones, POC UA: NEGATIVE mg/dL
Nitrite, UA: POSITIVE — AB
Specific Gravity, Urine: 1.02
Urobilinogen, Ur: 0.2
pH, UA: 6 (ref 5.0–8.0)

## 2022-12-20 NOTE — Patient Instructions (Addendum)
Please stay well hydrated. (Aim for your urine to be very pale yellow). Take the fluconazole that was prescribed by [facility] (should be on file at [facility]).  This might help with your worsening itching.  You are at increased risk for yeast due to being on a daily antibiotic, as well as your diabetes.  Your urine showed yeast back in June.  We are sending a urine culture, and will await that to decide any change in antibiotic.  Continue on your preventative one for now. If you develop fever, flank pain, or vomiting, please contact us right away.

## 2022-12-20 NOTE — Progress Notes (Signed)
Chief Complaint  Patient presents with   Urinary Frequency    Burning with urination, frequency. Some abdominal discomfort. Symptoms started Friday.    3/15 she noted some pelvic discomfort and urinary frequency. She took azo once yesterday, which helped with the discomfort.  Last dose was 5-6pm last night. She currently has minimal discomfort. Denies any cloudiness or odor to the urine.  Sometimes just some bubbles in the urine, from protein.  She has some chronic vaginal itching.  She is not aware of pain with voiding, just increased frequency.  She took a home urine test yesterday, prior to taking any AZO. She reports it was positive for leuks, negative for nitrite (She sent the results to her daughter, who was a Marine scientist, to help her interpret it). She takes a preventative antibiotic daily, taking for about a year.  She has done well until just recently. This is prescribed by [facility].  She was given a refill of diflucan 10/26/22. She states the itching wasn't constant or bad, so she never picked this up from the pharmacy. This was written by Laretta Bolster in order to see if it helped with the itching she had been complaining of.  The itching is worse now. She hasn't noticed much of a discharge.  Prior cultures reviewed--03/2022 >100K yeast; earlier that week, 25-50K mixed urogenital flora 06/2021 <10K mixed urogenital flora 04/2021 Klebsiella (sensitive to all but amp) 03/2021 10-25K E.coli (R to amp, gent, trimeth/sulfa, intermed to augmentin, cefuroxime, tobra)  Pt has diabetes, well controlled with metformin.   Has some kidney concerns, scheduled to see nephro. She has h/o kidney stones. Lab Results  Component Value Date   HGBA1C 6.6 (H) 10/27/2022   Lab Results  Component Value Date   CREATININE 1.23 (H) 10/27/2022     PMH, PSH, SH reviewed  Outpatient Encounter Medications as of 12/20/2022  Medication Sig Note   Alpha-Lipoic Acid 600 MG TABS Take 1 tablet by mouth every  evening.    Ascorbic Acid (VITAMIN C) 1000 MG tablet Take 1,000 mg by mouth daily.    aspirin 81 MG chewable tablet Chew 1 tablet (81 mg total) by mouth 2 (two) times daily.    atorvastatin (LIPITOR) 40 MG tablet TAKE 1 TABLET(40 MG) BY MOUTH AT BEDTIME    Blood Glucose Monitoring Suppl DEVI 1 each by Does not apply route in the morning, at noon, and at bedtime. May substitute to any manufacturer covered by patient's insurance.    cholecalciferol (VITAMIN D3) 25 MCG (1000 UNIT) tablet Take 1,000 Units by mouth daily.    docusate sodium (COLACE) 100 MG capsule Take 200 mg by mouth daily.    levothyroxine (SYNTHROID) 150 MCG tablet TAKE 1 TABLET(150 MCG) BY MOUTH DAILY BEFORE BREAKFAST    metFORMIN (GLUCOPHAGE) 500 MG tablet TAKE 1 AND 1/2 TABLETS BY MOUTH DAILY, WITH BREAKFAST, AND 1 TABLET EVERY EVENING    Misc Natural Products (GLUCOSAMINE CHOND CMP ADVANCED PO) Take 2 capsules by mouth every evening.    nortriptyline (PAMELOR) 50 MG capsule TAKE 1 CAPSULE(50 MG) BY MOUTH AT BEDTIME    Omega-3 Fatty Acids (OMEGA-3 FISH OIL PO) Take 2,000 capsules by mouth every evening.    phenazopyridine (PYRIDIUM) 95 MG tablet Take 95 mg by mouth 3 (three) times daily as needed for pain. 12/20/2022: Last dose 5pm yesterday   Probiotic Product (PROBIOTIC DAILY) CAPS Take 1 capsule by mouth every evening.    sertraline (ZOLOFT) 50 MG tablet TAKE 1 TABLET(50 MG) BY MOUTH DAILY  trimethoprim (TRIMPEX) 100 MG tablet Take 1 tablet (100 mg total) by mouth at bedtime.    Turmeric 500 MG CAPS Take 1 capsule by mouth every evening.    ALPRAZolam (XANAX) 0.25 MG tablet TAKE 1 TABLET(0.25 MG) BY MOUTH AT BEDTIME AS NEEDED FOR ANXIETY. DO NOT TAKE WITH PAIN MEDICATION OR OTHER MEDICATION THAT. MAY CAUSE DROWSINESS (Patient not taking: Reported on 12/20/2022) 12/20/2022: As needed   [DISCONTINUED] fluconazole (DIFLUCAN) 150 MG tablet Take one tablet by mouth at the first sign of symptoms of yeast. If no resolution, repeat dose  in 72 hours.    No facility-administered encounter medications on file as of 12/20/2022.   Allergies  Allergen Reactions   Curare [Tubocurarine] Other (See Comments)    Per pt was awake and aware that she had paralysis and could not breath   ROS:  no fever, chills, n/v/d.  Denies any flank pain. No URI symptoms.  She has some itchy eyes, allergies, cough. Vaginal itching, slight pelvic discomfort, and urinary frequency per HPI. No hematuria. No bleeding, bruising, rash, or other concerns, except as noted in HPI   PHYSICAL EXAM:  BP 120/60   Pulse 88   Temp 97.7 F (36.5 C) (Tympanic)   Ht 5\' 5"  (1.651 m)   Wt 147 lb (66.7 kg)   BMI 24.46 kg/m   Well-appearing, pleasant female, in no distress. HEENT: conjunctiva and sclera are clear, EOMI Neck: no lymphadenopathy or mass Heart: regular rate and rhythm Lungs: clear bilaterally Back: no CVA tenderness Abdomen: soft, normal bowel sounds, nontender, no mass Extremities: no edema Psych: normal mood, affect, hygiene and grooming Neuro: alert and oriented, normal gait.  Urine: Trace leuks, protein, +nitrite. SG 1.020, Orange color   ASSESSMENT/PLAN:  Urinary frequency - Ddx reviewed, h/o stones, concentrated urine. Stay well hydrated. Culture sent - Plan: POCT Urinalysis DIP (Proadvantage Device), Urine Culture  Pelvic pain - mild suprapubic discomfort. Treat UTI if + culture - Plan: Urine Culture  Itching in the vaginal area - fairly chronic, but now worse. Pt on daily ABX.  To take the diflucan as previously prescribed  I spent 31 minutes dedicated to the care of this patient, including pre-visit review of records, face to face time, post-visit ordering of testing and documentation.  Please stay well hydrated. (Aim for your urine to be very pale yellow). Take the fluconazole that was prescribed by [facility]eth ([facility] be [facility] at [facility]).  This might help with your worsening itching.  You are at increased risk for  yeast due to being on a daily antibiotic, as well as your diabetes.  Your urine showed yeast back in June.  We are sending a urine culture, and will await that to decide any change in antibiotic.  Continue on your preventative one for now. If you develop fever, flank pain, or vomiting, please contact us right away.

## 2022-12-21 LAB — URINE CULTURE: Organism ID, Bacteria: NO GROWTH

## 2022-12-31 ENCOUNTER — Other Ambulatory Visit: Payer: Self-pay

## 2022-12-31 ENCOUNTER — Ambulatory Visit: Payer: Self-pay

## 2022-12-31 ENCOUNTER — Ambulatory Visit
Admission: RE | Admit: 2022-12-31 | Discharge: 2022-12-31 | Disposition: A | Discharge status: Home / Self Care | Payer: Self-pay | Source: Ambulatory Visit | Attending: Nurse Practitioner | Admitting: Nurse Practitioner

## 2022-12-31 DIAGNOSIS — Z1231 Encounter for screening mammogram for malignant neoplasm of breast: Secondary | ICD-10-CM | POA: Diagnosis not present
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
## 2023-01-05 ENCOUNTER — Ambulatory Visit (INDEPENDENT_AMBULATORY_CARE_PROVIDER_SITE_OTHER): Payer: HMO | Admitting: Physician Assistant

## 2023-01-05 ENCOUNTER — Encounter: Payer: Self-pay | Admitting: Physician Assistant

## 2023-01-05 VITALS — BP 134/68 | HR 100 | Ht 65.0 in | Wt 147.5 lb

## 2023-01-05 DIAGNOSIS — Z9889 Other specified postprocedural states: Secondary | ICD-10-CM

## 2023-01-05 DIAGNOSIS — K219 Gastro-esophageal reflux disease without esophagitis: Secondary | ICD-10-CM

## 2023-01-05 DIAGNOSIS — K5904 Chronic idiopathic constipation: Secondary | ICD-10-CM

## 2023-01-05 DIAGNOSIS — N200 Calculus of kidney: Secondary | ICD-10-CM | POA: Diagnosis not present

## 2023-01-05 DIAGNOSIS — K625 Hemorrhage of anus and rectum: Secondary | ICD-10-CM

## 2023-01-05 MED ORDER — CLENPIQ 10-3.5-12 MG-GM -GM/160ML PO SOLN: 1.0000 | ORAL | 0 refills | Status: DC

## 2023-01-05 NOTE — Patient Instructions (Addendum)
You have been scheduled for an endoscopy and colonoscopy. Please follow the written instructions given to you at your visit today. Please pick up your prep supplies at the pharmacy within the next 1-3 days. If you use inhalers (even only as needed), please bring them with you on the day of your procedure.   Miralax is an osmotic laxative.  It only brings more water into the stool.  This is safe to take daily.  Can take up to 17 gram of miralax twice a day.  Mix with juice or coffee.  Start 1 capful at night for 3-4 days and reassess your response in 3-4 days.  You can increase and decrease the dose based on your response.  Remember, it can take up to 3-4 days to take effect OR for the effects to wear off.   I often pair this with benefiber in the morning to help assure the stool is not too loose.   Recommend starting on a fiber supplement, can try metamucil first but if this causes gas/bloating switch to benefiber or citracel, these do not cause gas.  Take with fiber with with a full 8 oz glass of water once a day. This can take 1 month to start helping, so try for at least one month.  Recommend increasing water and physical activity.   - Drink at least 64-80 ounces of water/liquid per day. - Establish a time to try to move your bowels every day.  For many people, this is after a cup of coffee or after a meal such as breakfast. - Sit all of the way back on the toilet keeping your back fairly straight and while sitting up, try to rest the tops of your forearms on your upper thighs.   - Raising your feet with a step stool/squatty potty can be helpful to improve the angle that allows your stool to pass through the rectum. - Relax the rectum feeling it bulge toward the toilet water.  If you feel your rectum raising toward your body, you are contracting rather than relaxing. - Breathe in and slowly exhale. "Belly breath" by expanding your belly towards your belly button. Keep belly expanded as you  gently direct pressure down and back to the anus.  A low pitched GRRR sound can assist with increasing intra-abdominal pressure.  - Repeat 3-4 times. If unsuccessful, contract the pelvic floor to restore normal tone and get off the toilet.  Avoid excessive straining. - To reduce excessive wiping by teaching your anus to normally contract, place hands on outer aspect of knees and resist knee movement outward.  Hold 5-10 second then place hands just inside of knees and resist inward movement of knees.  Hold 5 seconds.  Repeat a few times each way.  Go to the ER if unable to pass gas, severe AB pain, unable to hold down food, any shortness of breath of chest pain.  Do trial off tumeric and see if this helps the GERD Avoid spicy and acidic foods Avoid fatty foods Limit your intake of coffee, tea, alcohol, and carbonated drinks Work to maintain a healthy weight Keep the head of the bed elevated at least 3 inches with blocks or a wedge pillow if you are having any nighttime symptoms Stay upright for 2 hours after eating

## 2023-01-09 NOTE — Progress Notes (Signed)
Agree with assessment/plan.  Raj Rahil Passey, MD [facility] [phone]  

## 2023-01-17 ENCOUNTER — Ambulatory Visit (INDEPENDENT_AMBULATORY_CARE_PROVIDER_SITE_OTHER): Payer: HMO | Admitting: Nurse Practitioner

## 2023-01-17 ENCOUNTER — Encounter: Payer: Self-pay | Admitting: Nurse Practitioner

## 2023-01-17 VITALS — BP 122/78 | HR 66 | Wt 149.6 lb

## 2023-01-17 DIAGNOSIS — M7501 Adhesive capsulitis of right shoulder: Secondary | ICD-10-CM

## 2023-01-17 DIAGNOSIS — E1165 Type 2 diabetes mellitus with hyperglycemia: Secondary | ICD-10-CM | POA: Diagnosis not present

## 2023-01-17 DIAGNOSIS — M79662 Pain in left lower leg: Secondary | ICD-10-CM | POA: Diagnosis not present

## 2023-01-17 DIAGNOSIS — K5909 Other constipation: Secondary | ICD-10-CM

## 2023-01-17 NOTE — Progress Notes (Unsigned)
  Tollie Eth, DNP, AGNP-c [facility] [address] [place], [place] [address] [phone]  Subjective:   Bianca Phillips is a 67 y.o. female presents to day for evaluation of:  Right shoulder adhesive capsulitis  Left lateral Calf  PMH, Medications, and Allergies reviewed and updated in chart as appropriate.   ROS negative except for what is listed in HPI. Objective:  BP 122/78   Pulse 66   Wt 149 lb 9.6 oz (67.9 kg)   BMI 24.89 kg/m  Physical Exam        Assessment & Plan:   Problem List Items Addressed This Visit   None     Tollie Eth, DNP, AGNP-c 01/17/2023  9:19 AM    History, Medications, Surgery, SDOH, and Family History reviewed and updated as appropriate.

## 2023-01-17 NOTE — Patient Instructions (Addendum)
It is very important for you to do the stretches and exercises at home every day at home to keep the shoulder moving. You are really doing great with your exercise.   I sent the referral to the PT at [facility]. If you have any concerns with the cost please let me know and we can always change the location.  It is ok to stop the iron for now and we can watch the iron levels in your blood to make sure it hasn't dropped.

## 2023-01-19 DIAGNOSIS — M79662 Pain in left lower leg: Secondary | ICD-10-CM | POA: Insufficient documentation

## 2023-01-19 DIAGNOSIS — K5909 Other constipation: Secondary | ICD-10-CM | POA: Insufficient documentation

## 2023-01-19 HISTORY — DX: Pain in left lower leg: M79.662

## 2023-01-19 NOTE — Assessment & Plan Note (Signed)
Symptoms and presentation consistent with adhesive capsulitis. PT would be beneficial for management.  - The patient is referred to physical therapy for evaluation and tailored management. - Advised to persist with home exercises and pool activities, as tolerated. - May apply ice or heat for symptomatic relief as needed.

## 2023-01-19 NOTE — Assessment & Plan Note (Signed)
Chronic constipation in the setting of iron supplementation. Her recent labs show normalization of her hemoglobin and iron levels.  - Iron supplements are to be discontinued; iron levels will be monitored accordingly. - The patient should persist with MiraLax and stool softeners as currently prescribed. - Advised to adhere to a diet rich in fiber and ensure adequate hydration.

## 2023-01-19 NOTE — Assessment & Plan Note (Signed)
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  - The patient is reassured that the pain is likely attributable to varicose veins and is typically not indicative of a grave condition. - Recommended to continue aquatic exercises which may contribute to symptom relief. - Will monitor for any progression in pain or changes in the veins' appearance.

## 2023-01-19 NOTE — Assessment & Plan Note (Signed)
Chronic diabetes, managed with metformin, diet, and exercise. No alarm symptoms present.  - The patient is encouraged to maintain regular physical activity and a nutritious diet. - Blood glucose levels will be regularly monitored with medication adjustments made as necessary. - Patient education will be provided on the significance of proper foot care and vigilance for signs of neuropathy.

## 2023-02-02 DIAGNOSIS — N1831 Chronic kidney disease, stage 3a: Secondary | ICD-10-CM | POA: Diagnosis not present

## 2023-02-02 DIAGNOSIS — N200 Calculus of kidney: Secondary | ICD-10-CM | POA: Diagnosis not present

## 2023-02-02 DIAGNOSIS — K219 Gastro-esophageal reflux disease without esophagitis: Secondary | ICD-10-CM | POA: Diagnosis not present

## 2023-02-02 DIAGNOSIS — E1122 Type 2 diabetes mellitus with diabetic chronic kidney disease: Secondary | ICD-10-CM | POA: Diagnosis not present

## 2023-02-02 DIAGNOSIS — D509 Iron deficiency anemia, unspecified: Secondary | ICD-10-CM | POA: Diagnosis not present

## 2023-02-07 ENCOUNTER — Ambulatory Visit (INDEPENDENT_AMBULATORY_CARE_PROVIDER_SITE_OTHER): Payer: HMO | Admitting: Family Medicine

## 2023-02-07 ENCOUNTER — Other Ambulatory Visit: Payer: Self-pay | Admitting: Physician Assistant

## 2023-02-07 ENCOUNTER — Encounter: Payer: Self-pay | Admitting: Family Medicine

## 2023-02-07 VITALS — BP 130/60 | HR 84 | Temp 98.1°F | Ht 65.0 in | Wt 147.0 lb

## 2023-02-07 DIAGNOSIS — R059 Cough, unspecified: Secondary | ICD-10-CM

## 2023-02-07 DIAGNOSIS — J302 Other seasonal allergic rhinitis: Secondary | ICD-10-CM | POA: Diagnosis not present

## 2023-02-07 LAB — LAB REPORT - SCANNED
Creatinine, POC: 100.1 mg/dL
EGFR: 55

## 2023-02-07 MED ORDER — AZELASTINE HCL 0.1 % NA SOLN
2.0000 | Freq: Two times a day (BID) | NASAL | 5 refills | Status: DC
Start: 2023-02-07 — End: 2024-02-28

## 2023-02-07 NOTE — Progress Notes (Signed)
Chief Complaint  Patient presents with   Cough    Cough and nasal itching/watery eyes for weeks. Wants to make sure she doesn't have pneumonia. Has not taken any covid tests. Symptoms began a couple weeks ago.    Patient presents with complaint of a deep cough. She is worried it could be pneumonia or something contagious. Nephrologist told her that her lungs were clear last week. Coughed less when sleeping in her recliner last night.  She has allergies, and takes zyrtec and Flonase. Itchy, watery eyes, nose is itchy, itchy ears.  Coughing x 2 weeks or more, other allergy symptoms for longer. She has PND.  Denies sore throat.  Nasal drainage is clear.  Cough is nonproductive.  Only rarely has reflux, r/b Tums.   She is much better s/p surgery (Nissen fundoplication)  PMH, PSH, SH reviewed  Outpatient Encounter Medications as of 02/07/2023  Medication Sig Note   Alpha-Lipoic Acid 600 MG TABS Take 1 tablet by mouth every evening.    Ascorbic Acid (VITAMIN C) 1000 MG tablet Take 1,000 mg by mouth daily.    aspirin 81 MG chewable tablet Chew 1 tablet (81 mg total) by mouth 2 (two) times daily. (Patient taking differently: Chew 81 mg by mouth daily.)    atorvastatin (LIPITOR) 40 MG tablet TAKE 1 TABLET(40 MG) BY MOUTH AT BEDTIME    Blood Glucose Monitoring Suppl DEVI 1 each by Does not apply route in the morning, at noon, and at bedtime. May substitute to any manufacturer covered by patient's insurance.    cetirizine (ZYRTEC) 10 MG tablet Take 10 mg by mouth daily.    cholecalciferol (VITAMIN D3) 25 MCG (1000 UNIT) tablet Take 1,000 Units by mouth daily.    docusate sodium (COLACE) 100 MG capsule Take 200 mg by mouth daily.    fluticasone (FLONASE) 50 MCG/ACT nasal spray Place 2 sprays into both nostrils daily.    levothyroxine (SYNTHROID) 150 MCG tablet TAKE 1 TABLET(150 MCG) BY MOUTH DAILY BEFORE BREAKFAST    Magnesium 250 MG TABS Take 1 tablet by mouth daily.    metFORMIN (GLUCOPHAGE) 500  MG tablet TAKE 1 AND 1/2 TABLETS BY MOUTH DAILY, WITH BREAKFAST, AND 1 TABLET EVERY EVENING    Misc Natural Products (GLUCOSAMINE CHOND CMP ADVANCED PO) Take 2 capsules by mouth every evening.    nortriptyline (PAMELOR) 50 MG capsule TAKE 1 CAPSULE(50 MG) BY MOUTH AT BEDTIME    Omega-3 Fatty Acids (OMEGA-3 FISH OIL PO) Take 2,000 capsules by mouth every evening.    Probiotic Product (PROBIOTIC DAILY) CAPS Take 1 capsule by mouth every evening.    sertraline (ZOLOFT) 50 MG tablet TAKE 1 TABLET(50 MG) BY MOUTH DAILY    trimethoprim (TRIMPEX) 100 MG tablet Take 1 tablet (100 mg total) by mouth at bedtime.    Turmeric 500 MG CAPS Take 1 capsule by mouth every evening.    ALPRAZolam (XANAX) 0.25 MG tablet TAKE 1 TABLET(0.25 MG) BY MOUTH AT BEDTIME AS NEEDED FOR ANXIETY. DO NOT TAKE WITH PAIN MEDICATION OR OTHER MEDICATION THAT. MAY CAUSE DROWSINESS (Patient not taking: Reported on 01/17/2023) 02/07/2023: As needed   Sod Picosulfate-Mag Ox-Cit Acd (CLENPIQ) 10-3.5-12 MG-GM -GM/160ML SOLN Take 1 kit by mouth as directed. (Patient not taking: Reported on 02/07/2023)    No facility-administered encounter medications on file as of 02/07/2023.   Allergies  Allergen Reactions   Curare [Tubocurarine] Other (See Comments)    Per pt was awake and aware that she had paralysis and could not  breath   ROS:  no fever, chills, dizziness.  Wakes up with a HA daily, relieved after drinking her coffee. Rare heartburn. Wears hearing aids. Denies ear pain. No chest pain, shortness of breath, edema, rashes or other complaints. See HPI>    PHYSICAL EXAM:  BP 130/60   Pulse 84   Temp 98.1 F (36.7 C) (Tympanic)   Ht 5\' 5"  (1.651 m)   Wt 147 lb (66.7 kg)   BMI 24.46 kg/m   Well-appearing, pleasant female, in no distress. Voice is hoarse, some throat-clearing and occasional dry cough. Speaking easily and comfortably. HEENT: conjunctiva and  sclera are clear. TM's and EAC's normal. Sinuses nontender. OP clear. Nasal  mucosa is mildly edematous, L>R, no erythema or purulence. Neck: no lymphadenopathy or mass Heart: regular rate and rhythm Lungs: clear bilaterally, no wheezes, rales, ronchi Psych: normal mood (slightly anxious), affect, hygiene and grooming Neuro: alert and oriented, cranial nerves intact. Normal gait.   ASSESSMENT/PLAN:  Seasonal allergies - suboptimally controlled. Add astelin, proper use of flonase reviewed.  Suspect PND contrib to cough. Mucinex, Delsym prn - Plan: azelastine (ASTELIN) 0.1 % nasal spray  Cough, unspecified type - Ddx reviewed, suspect PND from allergies; to try prilosec if not resolving with mucinex, adding astelin   Stay well hydrated--drink plenty of water. Take guaifenesin regularly (I recommend taking plain Mucinex 12 hour, and take it twice daily). You can get a separate Delsym syrup (which contains the cough suppressant dextromethorphan).  You can use that up to twice daily, if needed for cough.  I suspect that your morning headaches across your forehead or more likely from your sinuses not draining well at night (not related to coffee).  You can use over-the-counter eye drops to help with the itchy eyes (there are many--Naphcon-A is one of the less expensive ones.  There are many that used to be prescription only, and are now available over-the-counter.  These include Patanol, Zaditor; these are more expensive). Be sure to use gentle sniffs with the Flonase.  We are sending in a prescription for Astelin nasal spray that you can add to your current allergy regimen.  You can consider doing sinus rinses once or twice daily.   Keep the windows closed, change the air filters. Consider saline rinses after being outside to help clean your nose.

## 2023-02-07 NOTE — Patient Instructions (Signed)
Stay well hydrated--drink plenty of water. Take guaifenesin regularly (I recommend taking plain Mucinex 12 hour, and take it twice daily). You can get a separate Delsym syrup (which contains the cough suppressant dextromethorphan).  You can use that up to twice daily, if needed for cough.  I suspect that your morning headaches across your forehead or more likely from your sinuses not draining well at night (not related to coffee).  You can use over-the-counter eye drops to help with the itchy eyes (there are many--Naphcon-A is one of the less expensive ones.  There are many that used to be prescription only, and are now available over-the-counter.  These include Patanol, Zaditor; these are more expensive). Be sure to use gentle sniffs with the Flonase. We are sending in a prescription for Astelin nasal spray that you can add to your current allergy regimen.  You can consider doing sinus rinses once or twice daily.   Keep the windows closed, change the air filters. Consider saline rinses after being outside to help clean your nose.  Return for re-evaluation if you develop fever, discolored mucus or phlegm, worsening cough, pain with breathing, shortness of breath, or any other concerns.

## 2023-02-15 ENCOUNTER — Encounter (HOSPITAL_BASED_OUTPATIENT_CLINIC_OR_DEPARTMENT_OTHER): Payer: Self-pay | Admitting: Physical Therapy

## 2023-02-15 ENCOUNTER — Ambulatory Visit (HOSPITAL_BASED_OUTPATIENT_CLINIC_OR_DEPARTMENT_OTHER): Payer: HMO | Attending: Nurse Practitioner | Admitting: Physical Therapy

## 2023-02-15 ENCOUNTER — Other Ambulatory Visit: Payer: Self-pay

## 2023-02-15 DIAGNOSIS — M25611 Stiffness of right shoulder, not elsewhere classified: Secondary | ICD-10-CM | POA: Diagnosis not present

## 2023-02-15 DIAGNOSIS — G8929 Other chronic pain: Secondary | ICD-10-CM | POA: Diagnosis not present

## 2023-02-15 DIAGNOSIS — M6281 Muscle weakness (generalized): Secondary | ICD-10-CM | POA: Diagnosis not present

## 2023-02-15 DIAGNOSIS — M25511 Pain in right shoulder: Secondary | ICD-10-CM | POA: Insufficient documentation

## 2023-02-15 DIAGNOSIS — M7501 Adhesive capsulitis of right shoulder: Secondary | ICD-10-CM | POA: Insufficient documentation

## 2023-02-15 NOTE — Therapy (Signed)
OUTPATIENT PHYSICAL THERAPY SHOULDER EVALUATION   Patient Name: Bianca Phillips MRN: [id number] DOB:July 19, 1956, 67 y.o., female Today's Date: 02/16/2023  END OF SESSION:  PT End of Session - 02/15/23 1508     Visit Number 1    Number of Visits 12    Date for PT Re-Evaluation 03/29/23    Authorization Type HTA    PT Start Time 1405    PT Stop Time 1456    PT Time Calculation (min) 51 min    Activity Tolerance Patient tolerated treatment well    Behavior During Therapy [facility] for tasks assessed/performed             Past Medical History:  Diagnosis Date   Complication of anesthesia    Constipation    GAD (generalized anxiety disorder)    Heart murmur    Heart palpitations    evaluated by cardiologist--- dr h. Shari Prows, note in epic 04-03-2021, normal echo and monitor showed NSR w/ SVT   History of COVID-19    per pt had covid twice in 12/ 2020 with mild symptoms with residual intermittant loss of taste;  and 04/ 2022 with mild symptoms that resolved   History of hyperthyroidism    age 52 s/p RAI   History of kidney stones    History of recurrent UTIs    History of repair of hiatal hernia 02/2020   Hyperlipidemia    Hypothyroidism, postablative    age 25  s/p RAI for hyperthyroidism;  followed by pcp   IDA (iron deficiency anemia)    MDD (major depressive disorder)    OA (osteoarthritis)    Right ureteral calculus    Seasonal allergic rhinitis    Type 2 diabetes mellitus (HCC)    followed by pcp    (07-14-2021  per pt checks blood sugar at home 2-3 times weekly,  fasting sugar --120-130)   Urgency of urination    Wears hearing aid in both ears    Past Surgical History:  Procedure Laterality Date   CESAREAN SECTION     x2  last one 1986   CYSTOSCOPY/URETEROSCOPY/HOLMIUM LASER/STENT PLACEMENT  2012   approx   CYSTOSCOPY/URETEROSCOPY/HOLMIUM LASER/STENT PLACEMENT Right 07/20/2021   Procedure: CYSTOSCOPY/RETROGRADE/URETEROSCOPY/HOLMIUM LASER/STENT PLACEMENT;   Surgeon: Jannifer Hick, MD;  Location: [facility];  Service: Urology;  Laterality: Right;  ONLY NEEDS 45 MIN   LAPAROSCOPIC NISSEN FUNDOPLICATION  02/2020   TOTAL ABDOMINAL HYSTERECTOMY W/ BILATERAL SALPINGOOPHORECTOMY  2002   TOTAL HIP ARTHROPLASTY Right 08/30/2021   Procedure: TOTAL HIP ARTHROPLASTY ANTERIOR APPROACH;  Surgeon: Kathryne Hitch, MD;  Location: [facility] OR;  Service: Orthopedics;  Laterality: Right;   Patient Active Problem List   Diagnosis Date Noted   Pain in left shin 01/19/2023   Chronic constipation 01/19/2023   Acute non-recurrent frontal sinusitis 07/14/2022   Closed right hip fracture (HCC) 08/29/2021   Insomnia 06/24/2021   Candida lusitanea infection 03/16/2021   Chronic kidney disease (CKD) stage G3b/A2, moderately decreased glomerular filtration rate (GFR) between 30-44 mL/min/1.73 square meter and albuminuria creatinine ratio between 30-299 mg/g (HCC) 03/16/2021   Acute midline low back pain without sciatica 02/24/2021   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   Tachycardia 02/05/2021   Itching in the vaginal area 02/05/2021   Generalized anxiety disorder 02/05/2021   Depression, major, single episode, mild (HCC) 02/05/2021   Heart palpitations 02/05/2021   Adhesive capsulitis of right shoulder 02/05/2021   Urinary frequency 02/05/2021   Type 2 diabetes mellitus with hyperglycemia, without  long-term current use of insulin (HCC) 02/05/2021   Postoperative hypothyroidism 02/05/2021   Hyperlipidemia associated with type 2 diabetes mellitus (HCC) 02/05/2021   Intractable migraine without aura and without status migrainosus 02/05/2021   Seasonal allergic rhinitis due to pollen 02/05/2021   Hiatal hernia with GERD 02/27/2020   Lump of right breast 11/28/2019   Angina pectoris (HCC) 10/12/2015   Kidney stone 12/16/2014    PCP: Tollie Eth, NP  REFERRING PROVIDER: Tollie Eth, NP  REFERRING DIAG: M75.01 (ICD-10-CM) - Adhesive capsulitis  of right shoulder  THERAPY DIAG:  Stiffness of right shoulder, not elsewhere classified  Right shoulder pain, unspecified chronicity  Muscle weakness (generalized)  Rationale for Evaluation and Treatment: Rehabilitation  ONSET DATE: MD order 01/17/2023 ; chronic sx's  SUBJECTIVE:                                                                                                                                                                                      SUBJECTIVE STATEMENT: Pt began having stiffness > pain approx 4 years ago.  Pt denies any specific MOI and was dx'd with adhesive capsulitis.  Pt saw PT in 2022 for just 2 visits due to co-pay.  Pt reports the exercises helped.  She was feeling good and stopped doing her exercises.  Her shoulder stiffness returned.  Pt saw PCP on 4/15 and requested PT.  PA ordered PT.  Pt thinks she also had some PT in [place] a few years ago.   "It's not hurting".  Pt states she doesn't have much pain, just stiffness and limited mobility.  Pt is unable to clasp bra.  She has difficulty with dressing including donning/doffing shirt.  Pt is limited with reaching overhead and reaching behind back.  Pt is limited with cleaning and reaching into an overhead cabinet and uses her L UE for those activities.   Pt has good movement in L shoulder though does have pain which feels like arthritis.  She performs aquatic classes/exercises 3-4x/wk which helps with shoulder mobility.  Hand dominance: Right  PERTINENT HISTORY: DM type 2 R THA in 2022 depression Anemia   PAIN:  NPRS:  0/10 current and best, 4-5/10 worst Location:  R shoulder  PRECAUTIONS: Other: R THA  WEIGHT BEARING RESTRICTIONS: No  FALLS:  Has patient fallen in last 6 months? No  LIVING ENVIRONMENT: Lives with: lives alone   OCCUPATION: Pt is retired  PLOF: Independent.  Pt has had chronic stiffness in shoulder which has affected reaching and daily activities.   PATIENT  GOALS:to be able to reach normally and to do the same activities on R that she does with L  NEXT MD VISIT:   OBJECTIVE:   DIAGNOSTIC FINDINGS:  N/A  PATIENT SURVEYS:  FOTO 63 with a goal of 15 at visit #10  COGNITION: Overall cognitive status: Within functional limits for tasks assessed      UPPER EXTREMITY ROM:   AROM/PROM Right eval Left eval  Shoulder flexion 116 159  Shoulder scaption 127 161  Shoulder abduction 97/112 127  Shoulder adduction    Shoulder internal rotation 38/54 66  Shoulder external rotation 32/44 61  Elbow flexion    Elbow extension    Wrist flexion    Wrist extension    Wrist ulnar deviation    Wrist radial deviation    Wrist pronation    Wrist supination    (Blank rows = not tested)  UPPER EXTREMITY MMT:  MMT Right eval Left eval  Shoulder flexion    Shoulder extension    Shoulder abduction    Shoulder adduction    Shoulder internal rotation 4/5 w/n available ROM 4/5  Shoulder external rotation 4/5 w/n available ROM 4/5  Middle trapezius    Lower trapezius    Elbow flexion    Elbow extension    Wrist flexion    Wrist extension    Wrist ulnar deviation    Wrist radial deviation    Wrist pronation    Wrist supination    Grip strength (lbs)    (Blank rows = not tested)     TODAY'S TREATMENT:                                                                                                                                          Pt performed supine wand ER approx 15 reps, supine wand flexion x 10 reps, and seated wand ER approx 15 reps.  Pt received a HEP handout and was educated in correct form and appropriate frequency.   PATIENT EDUCATION: Education details: dx, HEP, POC, rationale of exercises, objective findings, and relevant anatomy.  Person educated: Patient Education method: Explanation, Demonstration, Tactile cues, Verbal cues, and Handouts Education comprehension: verbalized understanding, returned demonstration,  verbal cues required, tactile cues required, and needs further education  HOME EXERCISE PROGRAM: Access Code: [email or web address] URL: [email or web address] Date: 02/15/2023 Prepared by: Aaron Edelman  Exercises - Supine Shoulder Wand flexion AAROM  - 2 x daily - 7 x weekly - 2 sets - 10 reps - Seated Shoulder External Rotation AAROM with Cane and Hand in Neutral  - 2 x daily - 7 x weekly - 2 sets - 10 reps  ASSESSMENT:  CLINICAL IMPRESSION: Patient is a 67 y.o. female with a dx of R shoulder adhesive capsulitis presenting to the clinic with limited R shoulder ROM, muscle weakness in bilat RTC, and R shoulder pain.  Pt has been having issues with R shoulder for 4 years.  She reports improvement with prior PT.   Pt reports having more stiffness than  pain.  Pt has difficulty with dressing and is limited with reaching overhead and reaching behind back.  She is limited with household cleaning.  Pt should benefit from skilled PT services to address impairments and to improve overall function.      OBJECTIVE IMPAIRMENTS: decreased ROM, decreased strength, hypomobility, impaired flexibility, impaired UE functional use, and pain.   ACTIVITY LIMITATIONS: lifting, dressing, and reach over head  PARTICIPATION LIMITATIONS: cleaning  PERSONAL FACTORS: Time since onset of injury/illness/exacerbation and 1-2 comorbidities: DM type 2 and anemia  are also affecting patient's functional outcome.   REHAB POTENTIAL: Good  CLINICAL DECISION MAKING: Stable/uncomplicated  EVALUATION COMPLEXITY: Low   GOALS:   SHORT TERM GOALS: Target date: 03/08/2023    Pt will be independent and compliant with HEP for improved ROM, strength, pain, and function. Baseline: Goal status: INITIAL  2.  Pt will demo at least a 10 deg increase in flexion, scaption, abd, ER, and IR AROM for improved stiffness and reaching.  Baseline:  Goal status: INITIAL  3.  Pt will report at least a 25% improvement in reaching  activities.  Baseline:  Goal status: INITIAL    LONG TERM GOALS: Target date: 03/29/2023  Pt will report increased usage of R UE with household cleaning and reaching into cabinets and decreased compensatory usage of L UE.  Baseline:  Goal status: INITIAL  2.  Pt will report she is reaching into overhead cabinets with R UE without difficulty.   Baseline:  Goal status: INITIAL  3.  Pt will report she is able to dress without significant pain and difficulty.  Baseline:  Goal status: INITIAL  4.  Pt will demo improved R shoulder AROM to at least 140 deg in flexion and scaption, 60 deg in ER, and 55 deg in IR for improved performance of ADLs/IADLs and reaching activities.  Baseline:  Goal status: INITIAL   PLAN:  PT FREQUENCY: 2x/week  PT DURATION: 6 weeks  PLANNED INTERVENTIONS: Therapeutic exercises, Therapeutic activity, Neuromuscular re-education, Patient/Family education, Self Care, Joint mobilization, Aquatic Therapy, Dry Needling, Electrical stimulation, Spinal mobilization, Cryotherapy, Moist heat, Taping, Ultrasound, Manual therapy, and Re-evaluation  PLAN FOR NEXT SESSION: Review and perform HEP.  Cont with shoulder ROM and joint mobs.    Audie Clear III PT, DPT 02/16/23 9:59 PM

## 2023-02-17 ENCOUNTER — Encounter (HOSPITAL_BASED_OUTPATIENT_CLINIC_OR_DEPARTMENT_OTHER): Payer: Self-pay

## 2023-02-17 ENCOUNTER — Ambulatory Visit (HOSPITAL_BASED_OUTPATIENT_CLINIC_OR_DEPARTMENT_OTHER): Payer: HMO

## 2023-02-17 DIAGNOSIS — M6281 Muscle weakness (generalized): Secondary | ICD-10-CM

## 2023-02-17 DIAGNOSIS — G8929 Other chronic pain: Secondary | ICD-10-CM

## 2023-02-17 DIAGNOSIS — M25511 Pain in right shoulder: Secondary | ICD-10-CM

## 2023-02-17 DIAGNOSIS — M25611 Stiffness of right shoulder, not elsewhere classified: Secondary | ICD-10-CM

## 2023-02-17 NOTE — Therapy (Signed)
OUTPATIENT PHYSICAL THERAPY SHOULDER TREATMENT   Patient Name: Bianca Phillips MRN: [id number] DOB:02/23/1956, 67 y.o., female Today's Date: 02/17/2023  END OF SESSION:  PT End of Session - 02/17/23 1541     Visit Number 2    Number of Visits 12    Date for PT Re-Evaluation 03/29/23    Authorization Type HTA    PT Start Time 1518    PT Stop Time 1559    PT Time Calculation (min) 41 min    Activity Tolerance Patient tolerated treatment well    Behavior During Therapy [facility] for tasks assessed/performed              Past Medical History:  Diagnosis Date   Complication of anesthesia    Constipation    GAD (generalized anxiety disorder)    Heart murmur    Heart palpitations    evaluated by cardiologist--- dr h. Shari Prows, note in epic 04-03-2021, normal echo and monitor showed NSR w/ SVT   History of COVID-19    per pt had covid twice in 12/ 2020 with mild symptoms with residual intermittant loss of taste;  and 04/ 2022 with mild symptoms that resolved   History of hyperthyroidism    age 33 s/p RAI   History of kidney stones    History of recurrent UTIs    History of repair of hiatal hernia 02/2020   Hyperlipidemia    Hypothyroidism, postablative    age 1  s/p RAI for hyperthyroidism;  followed by pcp   IDA (iron deficiency anemia)    MDD (major depressive disorder)    OA (osteoarthritis)    Right ureteral calculus    Seasonal allergic rhinitis    Type 2 diabetes mellitus (HCC)    followed by pcp    (07-14-2021  per pt checks blood sugar at home 2-3 times weekly,  fasting sugar --120-130)   Urgency of urination    Wears hearing aid in both ears    Past Surgical History:  Procedure Laterality Date   CESAREAN SECTION     x2  last one 1986   CYSTOSCOPY/URETEROSCOPY/HOLMIUM LASER/STENT PLACEMENT  2012   approx   CYSTOSCOPY/URETEROSCOPY/HOLMIUM LASER/STENT PLACEMENT Right 07/20/2021   Procedure: CYSTOSCOPY/RETROGRADE/URETEROSCOPY/HOLMIUM LASER/STENT PLACEMENT;   Surgeon: Jannifer Hick, MD;  Location: [facility];  Service: Urology;  Laterality: Right;  ONLY NEEDS 45 MIN   LAPAROSCOPIC NISSEN FUNDOPLICATION  02/2020   TOTAL ABDOMINAL HYSTERECTOMY W/ BILATERAL SALPINGOOPHORECTOMY  2002   TOTAL HIP ARTHROPLASTY Right 08/30/2021   Procedure: TOTAL HIP ARTHROPLASTY ANTERIOR APPROACH;  Surgeon: Kathryne Hitch, MD;  Location: [facility];  Service: Orthopedics;  Laterality: Right;   Patient Active Problem List   Diagnosis Date Noted   Pain in left shin 01/19/2023   Chronic constipation 01/19/2023   Acute non-recurrent frontal sinusitis 07/14/2022   Closed right hip fracture (HCC) 08/29/2021   Insomnia 06/24/2021   Candida lusitanea infection 03/16/2021   Chronic kidney disease (CKD) stage G3b/A2, moderately decreased glomerular filtration rate (GFR) between 30-44 mL/min/1.73 square meter and albuminuria creatinine ratio between 30-299 mg/g (HCC) 03/16/2021   Acute midline low back pain without sciatica 02/24/2021   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   Tachycardia 02/05/2021   Itching in the vaginal area 02/05/2021   Generalized anxiety disorder 02/05/2021   Depression, major, single episode, mild (HCC) 02/05/2021   Heart palpitations 02/05/2021   Adhesive capsulitis of right shoulder 02/05/2021   Urinary frequency 02/05/2021   Type 2 diabetes mellitus with hyperglycemia,  without long-term current use of insulin (HCC) 02/05/2021   Postoperative hypothyroidism 02/05/2021   Hyperlipidemia associated with type 2 diabetes mellitus (HCC) 02/05/2021   Intractable migraine without aura and without status migrainosus 02/05/2021   Seasonal allergic rhinitis due to pollen 02/05/2021   Hiatal hernia with GERD 02/27/2020   Lump of right breast 11/28/2019   Angina pectoris (HCC) 10/12/2015   Kidney stone 12/16/2014    PCP: Tollie Eth, NP  REFERRING PROVIDER: Tollie Eth, NP  REFERRING DIAG: M75.01 (ICD-10-CM) - Adhesive capsulitis  of right shoulder  THERAPY DIAG:  Stiffness of right shoulder, not elsewhere classified  Right shoulder pain, unspecified chronicity  Chronic right shoulder pain  Muscle weakness (generalized)  Rationale for Evaluation and Treatment: Rehabilitation  ONSET DATE: MD order 01/17/2023 ; chronic sx's  SUBJECTIVE:                                                                                                                                                                                      SUBJECTIVE STATEMENT: 0/10 pain at entry in R shoulder. She reports compliance with HEP.     Hand dominance: Right  PERTINENT HISTORY: DM type 2 R THA in 2022 depression Anemia   PAIN:  NPRS:  0/10 current and best, 4-5/10 worst Location:  R shoulder  PRECAUTIONS: Other: R THA  WEIGHT BEARING RESTRICTIONS: No  FALLS:  Has patient fallen in last 6 months? No  LIVING ENVIRONMENT: Lives with: lives alone   OCCUPATION: Pt is retired  PLOF: Independent.  Pt has had chronic stiffness in shoulder which has affected reaching and daily activities.   PATIENT GOALS:to be able to reach normally and to do the same activities on R that she does with L  NEXT MD VISIT:   OBJECTIVE:   DIAGNOSTIC FINDINGS:  N/A  PATIENT SURVEYS:  FOTO 63 with a goal of 34 at visit #10  COGNITION: Overall cognitive status: Within functional limits for tasks assessed      UPPER EXTREMITY ROM:   AROM/PROM Right eval Left eval  Shoulder flexion 116 159  Shoulder scaption 127 161  Shoulder abduction 97/112 127  Shoulder adduction    Shoulder internal rotation 38/54 66  Shoulder external rotation 32/44 61  Elbow flexion    Elbow extension    Wrist flexion    Wrist extension    Wrist ulnar deviation    Wrist radial deviation    Wrist pronation    Wrist supination    (Blank rows = not tested)  UPPER EXTREMITY MMT:  MMT Right eval Left eval  Shoulder flexion    Shoulder extension     Shoulder  abduction    Shoulder adduction    Shoulder internal rotation 4/5 w/n available ROM 4/5  Shoulder external rotation 4/5 w/n available ROM 4/5  Middle trapezius    Lower trapezius    Elbow flexion    Elbow extension    Wrist flexion    Wrist extension    Wrist ulnar deviation    Wrist radial deviation    Wrist pronation    Wrist supination    Grip strength (lbs)    (Blank rows = not tested)     TODAY'S TREATMENT:                                                                                                                                          -PROM right shoulder GHJ mobilizations with posterior and inferior glide grade 2-3 Supine wand flexion 2 pound bar 2 x 10 Side-lying abduction 2 x 10 Side-lying ER 2 x 10 Pulleys 2 minutes each flexion and scaption Standing shoulder active flexion 2 x 10 Scapular row red Thera-Band 2 x 10 Shoulder extension red Thera-Band 2 x 10 Wall slides 1 x 10  PATIENT EDUCATION: Education details: dx, HEP, POC, rationale of exercises, objective findings, and relevant anatomy.  Person educated: Patient Education method: Explanation, Demonstration, Tactile cues, Verbal cues, and Handouts Education comprehension: verbalized understanding, returned demonstration, verbal cues required, tactile cues required, and needs further education  HOME EXERCISE PROGRAM: Access Code: [email or web address] URL: [email or web address] Date: 02/15/2023 Prepared by: Aaron Edelman  Exercises - Supine Shoulder Wand flexion AAROM  - 2 x daily - 7 x weekly - 2 sets - 10 reps - Seated Shoulder External Rotation AAROM with Cane and Hand in Neutral  - 2 x daily - 7 x weekly - 2 sets - 10 reps  ASSESSMENT:  CLINICAL IMPRESSION: Good tolerance to first PT session post eval.  Patient with visible improvements in mobility compared to IE.  Good tolerance to passive range of motion without significant discomfort.  Minimal fatigue noted with exercises.   Discussed with patient importance of not overdoing HEP as patient reports issue with this in the past.  Will progress as tolerated depending on patient's response to first session.     OBJECTIVE IMPAIRMENTS: decreased ROM, decreased strength, hypomobility, impaired flexibility, impaired UE functional use, and pain.   ACTIVITY LIMITATIONS: lifting, dressing, and reach over head  PARTICIPATION LIMITATIONS: cleaning  PERSONAL FACTORS: Time since onset of injury/illness/exacerbation and 1-2 comorbidities: DM type 2 and anemia  are also affecting patient's functional outcome.   REHAB POTENTIAL: Good  CLINICAL DECISION MAKING: Stable/uncomplicated  EVALUATION COMPLEXITY: Low   GOALS:   SHORT TERM GOALS: Target date: 03/08/2023    Pt will be independent and compliant with HEP for improved ROM, strength, pain, and function. Baseline: Goal status: INITIAL  2.  Pt will demo at least a 10 deg increase in flexion, scaption, abd, ER, and  IR AROM for improved stiffness and reaching.  Baseline:  Goal status: INITIAL  3.  Pt will report at least a 25% improvement in reaching activities.  Baseline:  Goal status: INITIAL    LONG TERM GOALS: Target date: 03/29/2023  Pt will report increased usage of R UE with household cleaning and reaching into cabinets and decreased compensatory usage of L UE.  Baseline:  Goal status: INITIAL  2.  Pt will report she is reaching into overhead cabinets with R UE without difficulty.   Baseline:  Goal status: INITIAL  3.  Pt will report she is able to dress without significant pain and difficulty.  Baseline:  Goal status: INITIAL  4.  Pt will demo improved R shoulder AROM to at least 140 deg in flexion and scaption, 60 deg in ER, and 55 deg in IR for improved performance of ADLs/IADLs and reaching activities.  Baseline:  Goal status: INITIAL   PLAN:  PT FREQUENCY: 2x/week  PT DURATION: 6 weeks  PLANNED INTERVENTIONS: Therapeutic exercises,  Therapeutic activity, Neuromuscular re-education, Patient/Family education, Self Care, Joint mobilization, Aquatic Therapy, Dry Needling, Electrical stimulation, Spinal mobilization, Cryotherapy, Moist heat, Taping, Ultrasound, Manual therapy, and Re-evaluation  PLAN FOR NEXT SESSION: Review and perform HEP.  Cont with shoulder ROM and joint mobs.   Riki Altes, PTA  02/17/23 4:53 PM

## 2023-02-21 ENCOUNTER — Encounter (HOSPITAL_BASED_OUTPATIENT_CLINIC_OR_DEPARTMENT_OTHER): Payer: Self-pay

## 2023-02-21 ENCOUNTER — Ambulatory Visit (HOSPITAL_BASED_OUTPATIENT_CLINIC_OR_DEPARTMENT_OTHER): Payer: HMO

## 2023-02-21 DIAGNOSIS — M6281 Muscle weakness (generalized): Secondary | ICD-10-CM

## 2023-02-21 DIAGNOSIS — M25611 Stiffness of right shoulder, not elsewhere classified: Secondary | ICD-10-CM

## 2023-02-21 DIAGNOSIS — M25511 Pain in right shoulder: Secondary | ICD-10-CM

## 2023-02-21 NOTE — Therapy (Signed)
OUTPATIENT PHYSICAL THERAPY SHOULDER TREATMENT   Patient Name: Bianca Phillips MRN: [id number] DOB:March 17, 1956, 67 y.o., female Today's Date: 02/21/2023  END OF SESSION:  PT End of Session - 02/21/23 1108     Visit Number 3    Number of Visits 12    Date for PT Re-Evaluation 03/29/23    Authorization Type HTA    PT Start Time 1105    PT Stop Time 1143    PT Time Calculation (min) 38 min    Activity Tolerance Patient tolerated treatment well    Behavior During Therapy WFL for tasks assessed/performed               Past Medical History:  Diagnosis Date   Complication of anesthesia    Constipation    GAD (generalized anxiety disorder)    Heart murmur    Heart palpitations    evaluated by cardiologist--- dr h. Shari Prows, note in epic 04-03-2021, normal echo and monitor showed NSR w/ SVT   History of COVID-19    per pt had covid twice in 12/ 2020 with mild symptoms with residual intermittant loss of taste;  and 04/ 2022 with mild symptoms that resolved   History of hyperthyroidism    age 59 s/p RAI   History of kidney stones    History of recurrent UTIs    History of repair of hiatal hernia 02/2020   Hyperlipidemia    Hypothyroidism, postablative    age 18  s/p RAI for hyperthyroidism;  followed by pcp   IDA (iron deficiency anemia)    MDD (major depressive disorder)    OA (osteoarthritis)    Right ureteral calculus    Seasonal allergic rhinitis    Type 2 diabetes mellitus (HCC)    followed by pcp    (07-14-2021  per pt checks blood sugar at home 2-3 times weekly,  fasting sugar --120-130)   Urgency of urination    Wears hearing aid in both ears    Past Surgical History:  Procedure Laterality Date   CESAREAN SECTION     x2  last one 1986   CYSTOSCOPY/URETEROSCOPY/HOLMIUM LASER/STENT PLACEMENT  2012   approx   CYSTOSCOPY/URETEROSCOPY/HOLMIUM LASER/STENT PLACEMENT Right 07/20/2021   Procedure: CYSTOSCOPY/RETROGRADE/URETEROSCOPY/HOLMIUM LASER/STENT PLACEMENT;   Surgeon: Jannifer Hick, MD;  Location[facility];  Service: Urology;  Laterality: Right;  ONLY NEEDS 45 MIN   LAPAROSCOPIC NISSEN FUNDOPLICATION  02/2020   TOTAL ABDOMINAL HYSTERECTOMY W/ BILATERAL SALPINGOOPHORECTOMY  2002   TOTAL HIP ARTHROPLASTY Right 08/30/2021   Procedure: TOTAL HIP ARTHROPLASTY ANTERIOR APPROACH;  Surgeon: Kathryne Hitch, MD;  Location: [facility];  Service: Orthopedics;  Laterality: Right;   Patient Active Problem List   Diagnosis Date Noted   Pain in left shin 01/19/2023   Chronic constipation 01/19/2023   Acute non-recurrent frontal sinusitis 07/14/2022   Closed right hip fracture (HCC) 08/29/2021   Insomnia 06/24/2021   Candida lusitanea infection 03/16/2021   Chronic kidney disease (CKD) stage G3b/A2, moderately decreased glomerular filtration rate (GFR) between 30-44 mL/min/1.73 square meter and albuminuria creatinine ratio between 30-299 mg/g (HCC) 03/16/2021   Acute midline low back pain without sciatica 02/24/2021   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   Tachycardia 02/05/2021   Itching in the vaginal area 02/05/2021   Generalized anxiety disorder 02/05/2021   Depression, major, single episode, mild (HCC) 02/05/2021   Heart palpitations 02/05/2021   Adhesive capsulitis of right shoulder 02/05/2021   Urinary frequency 02/05/2021   Type 2 diabetes mellitus with  hyperglycemia, without long-term current use of insulin (HCC) 02/05/2021   Postoperative hypothyroidism 02/05/2021   Hyperlipidemia associated with type 2 diabetes mellitus (HCC) 02/05/2021   Intractable migraine without aura and without status migrainosus 02/05/2021   Seasonal allergic rhinitis due to pollen 02/05/2021   Hiatal hernia with GERD 02/27/2020   Lump of right breast 11/28/2019   Angina pectoris (HCC) 10/12/2015   Kidney stone 12/16/2014    PCP: Tollie Eth, NP  REFERRING PROVIDER: Tollie Eth, NP  REFERRING DIAG: M75.01 (ICD-10-CM) - Adhesive capsulitis  of right shoulder  THERAPY DIAG:  Stiffness of right shoulder, not elsewhere classified  Right shoulder pain, unspecified chronicity  Muscle weakness (generalized)  Rationale for Evaluation and Treatment: Rehabilitation  ONSET DATE: MD order 01/17/2023 ; chronic sx's  SUBJECTIVE:                                                                                                                                                                                      SUBJECTIVE STATEMENT: No complaints after last session. No pain at rest. She has been compliant with HEP.    Hand dominance: Right  PERTINENT HISTORY: DM type 2 R THA in 2022 depression Anemia   PAIN:  NPRS:  0/10 current and best, 4-5/10 worst Location:  R shoulder  PRECAUTIONS: Other: R THA  WEIGHT BEARING RESTRICTIONS: No  FALLS:  Has patient fallen in last 6 months? No  LIVING ENVIRONMENT: Lives with: lives alone   OCCUPATION: Pt is retired  PLOF: Independent.  Pt has had chronic stiffness in shoulder which has affected reaching and daily activities.   PATIENT GOALS:to be able to reach normally and to do the same activities on R that she does with L  NEXT MD VISIT:   OBJECTIVE:   DIAGNOSTIC FINDINGS:  N/A  PATIENT SURVEYS:  FOTO 63 with a goal of 35 at visit #10  COGNITION: Overall cognitive status: Within functional limits for tasks assessed      UPPER EXTREMITY ROM:   AROM/PROM Right eval Left eval  Shoulder flexion 116 159  Shoulder scaption 127 161  Shoulder abduction 97/112 127  Shoulder adduction    Shoulder internal rotation 38/54 66  Shoulder external rotation 32/44 61  Elbow flexion    Elbow extension    Wrist flexion    Wrist extension    Wrist ulnar deviation    Wrist radial deviation    Wrist pronation    Wrist supination    (Blank rows = not tested)  UPPER EXTREMITY MMT:  MMT Right eval Left eval  Shoulder flexion    Shoulder extension    Shoulder  abduction  Shoulder adduction    Shoulder internal rotation 4/5 w/n available ROM 4/5  Shoulder external rotation 4/5 w/n available ROM 4/5  Middle trapezius    Lower trapezius    Elbow flexion    Elbow extension    Wrist flexion    Wrist extension    Wrist ulnar deviation    Wrist radial deviation    Wrist pronation    Wrist supination    Grip strength (lbs)    (Blank rows = not tested)     TODAY'S TREATMENT:                                                                                                                                          -PROM right shoulder GHJ mobilizations with posterior and inferior glide grade 2-3 Supine wand flexion 2 pound bar 2 x 10 Supine active flexion 2x10 1lb Side-lying abduction 2 x 10 Side-lying ER 2 x 10 1lb Pulleys 2 minutes each flexion and scaption Standing shoulder active flexion 2 x 10 Scapular row red Thera-Band 3 x 10 Shoulder extension red Thera-Band 3 x 10 ER with RTB 2x10 IR with RTB 2x10  PATIENT EDUCATION: Education details: dx, HEP, POC, rationale of exercises, objective findings, and relevant anatomy.  Person educated: Patient Education method: Explanation, Demonstration, Tactile cues, Verbal cues, and Handouts Education comprehension: verbalized understanding, returned demonstration, verbal cues required, tactile cues required, and needs further education  HOME EXERCISE PROGRAM: Access Code: [email or web address] URL: [email or web address] Date: 02/15/2023 Prepared by: Aaron Edelman  Exercises - Supine Shoulder Wand flexion AAROM  - 2 x daily - 7 x weekly - 2 sets - 10 reps - Seated Shoulder External Rotation AAROM with Cane and Hand in Neutral  - 2 x daily - 7 x weekly - 2 sets - 10 reps  ASSESSMENT:  CLINICAL IMPRESSION: Pt is improving with available motion, though not quite equal to contralateral UE. She is more challenged with abduction than flexion actively. Cues required with IR/ER to maintain elbow  at side. Will continue to work on functional ROM and strengthening as tolerated.   OBJECTIVE IMPAIRMENTS: decreased ROM, decreased strength, hypomobility, impaired flexibility, impaired UE functional use, and pain.   ACTIVITY LIMITATIONS: lifting, dressing, and reach over head  PARTICIPATION LIMITATIONS: cleaning  PERSONAL FACTORS: Time since onset of injury/illness/exacerbation and 1-2 comorbidities: DM type 2 and anemia  are also affecting patient's functional outcome.   REHAB POTENTIAL: Good  CLINICAL DECISION MAKING: Stable/uncomplicated  EVALUATION COMPLEXITY: Low   GOALS:   SHORT TERM GOALS: Target date: 03/08/2023    Pt will be independent and compliant with HEP for improved ROM, strength, pain, and function. Baseline: Goal status: INITIAL  2.  Pt will demo at least a 10 deg increase in flexion, scaption, abd, ER, and IR AROM for improved stiffness and reaching.  Baseline:  Goal status: INITIAL  3.  Pt will report at  least a 25% improvement in reaching activities.  Baseline:  Goal status: INITIAL    LONG TERM GOALS: Target date: 03/29/2023  Pt will report increased usage of R UE with household cleaning and reaching into cabinets and decreased compensatory usage of L UE.  Baseline:  Goal status: INITIAL  2.  Pt will report she is reaching into overhead cabinets with R UE without difficulty.   Baseline:  Goal status: INITIAL  3.  Pt will report she is able to dress without significant pain and difficulty.  Baseline:  Goal status: INITIAL  4.  Pt will demo improved R shoulder AROM to at least 140 deg in flexion and scaption, 60 deg in ER, and 55 deg in IR for improved performance of ADLs/IADLs and reaching activities.  Baseline:  Goal status: INITIAL   PLAN:  PT FREQUENCY: 2x/week  PT DURATION: 6 weeks  PLANNED INTERVENTIONS: Therapeutic exercises, Therapeutic activity, Neuromuscular re-education, Patient/Family education, Self Care, Joint mobilization,  Aquatic Therapy, Dry Needling, Electrical stimulation, Spinal mobilization, Cryotherapy, Moist heat, Taping, Ultrasound, Manual therapy, and Re-evaluation  PLAN FOR NEXT SESSION: Review and perform HEP.  Cont with shoulder ROM and joint mobs.   Riki Altes, PTA  02/21/23 11:55 AM

## 2023-02-22 ENCOUNTER — Encounter (HOSPITAL_BASED_OUTPATIENT_CLINIC_OR_DEPARTMENT_OTHER): Payer: HMO | Admitting: Physical Therapy

## 2023-02-23 ENCOUNTER — Other Ambulatory Visit: Payer: Self-pay | Admitting: Physician Assistant

## 2023-02-23 ENCOUNTER — Telehealth: Payer: Self-pay | Admitting: Nurse Practitioner

## 2023-02-23 DIAGNOSIS — F411 Generalized anxiety disorder: Secondary | ICD-10-CM

## 2023-02-23 NOTE — Telephone Encounter (Signed)
Pt requesting a refill on xnanx to  [id number] - [place], [place] - [address]  , she says she will be going out of town Friday and will need it before then

## 2023-02-24 ENCOUNTER — Ambulatory Visit (HOSPITAL_BASED_OUTPATIENT_CLINIC_OR_DEPARTMENT_OTHER): Payer: HMO | Admitting: Physical Therapy

## 2023-02-24 ENCOUNTER — Encounter (HOSPITAL_BASED_OUTPATIENT_CLINIC_OR_DEPARTMENT_OTHER): Payer: Self-pay | Admitting: Physical Therapy

## 2023-02-24 DIAGNOSIS — M25611 Stiffness of right shoulder, not elsewhere classified: Secondary | ICD-10-CM

## 2023-02-24 DIAGNOSIS — M25511 Pain in right shoulder: Secondary | ICD-10-CM

## 2023-02-24 DIAGNOSIS — M6281 Muscle weakness (generalized): Secondary | ICD-10-CM

## 2023-02-24 MED ORDER — ALPRAZOLAM 0.25 MG PO TABS
0.2500 mg | ORAL_TABLET | Freq: Two times a day (BID) | ORAL | 2 refills | Status: DC | PRN
Start: 2023-02-24 — End: 2023-10-28

## 2023-02-24 NOTE — Therapy (Signed)
OUTPATIENT PHYSICAL THERAPY SHOULDER TREATMENT   Patient Name: Bianca Phillips MRN: [id number] DOB:1956/10/04, 67 y.o., female Today's Date: 02/25/2023  END OF SESSION:  PT End of Session - 02/24/23 1409     Visit Number 4    Number of Visits 12    Date for PT Re-Evaluation 03/29/23    Authorization Type HTA    PT Start Time 1406    PT Stop Time 1446    PT Time Calculation (min) 40 min    Activity Tolerance Patient tolerated treatment well    Behavior During Therapy [facility] for tasks assessed/performed               Past Medical History:  Diagnosis Date   Complication of anesthesia    Constipation    GAD (generalized anxiety disorder)    Heart murmur    Heart palpitations    evaluated by cardiologist--- dr h. Shari Prows, note in epic 04-03-2021, normal echo and monitor showed NSR w/ SVT   History of COVID-19    per pt had covid twice in 12/ 2020 with mild symptoms with residual intermittant loss of taste;  and 04/ 2022 with mild symptoms that resolved   History of hyperthyroidism    age 66 s/p RAI   History of kidney stones    History of recurrent UTIs    History of repair of hiatal hernia 02/2020   Hyperlipidemia    Hypothyroidism, postablative    age 49  s/p RAI for hyperthyroidism;  followed by pcp   IDA (iron deficiency anemia)    MDD (major depressive disorder)    OA (osteoarthritis)    Right ureteral calculus    Seasonal allergic rhinitis    Type 2 diabetes mellitus (HCC)    followed by pcp    (07-14-2021  per pt checks blood sugar at home 2-3 times weekly,  fasting sugar --120-130)   Urgency of urination    Wears hearing aid in both ears    Past Surgical History:  Procedure Laterality Date   CESAREAN SECTION     x2  last one 1986   CYSTOSCOPY/URETEROSCOPY/HOLMIUM LASER/STENT PLACEMENT  2012   approx   CYSTOSCOPY/URETEROSCOPY/HOLMIUM LASER/STENT PLACEMENT Right 07/20/2021   Procedure: CYSTOSCOPY/RETROGRADE/URETEROSCOPY/HOLMIUM LASER/STENT PLACEMENT;   Surgeon: Jannifer Hick, MD;  Location: [facility];  Service: Urology;  Laterality: Right;  ONLY NEEDS 45 MIN   LAPAROSCOPIC NISSEN FUNDOPLICATION  02/2020   TOTAL ABDOMINAL HYSTERECTOMY W/ BILATERAL SALPINGOOPHORECTOMY  2002   TOTAL HIP ARTHROPLASTY Right 08/30/2021   Procedure: TOTAL HIP ARTHROPLASTY ANTERIOR APPROACH;  Surgeon: Kathryne Hitch, MD;  Location: [facility];  Service: Orthopedics;  Laterality: Right;   Patient Active Problem List   Diagnosis Date Noted   Pain in left shin 01/19/2023   Chronic constipation 01/19/2023   Acute non-recurrent frontal sinusitis 07/14/2022   Closed right hip fracture (HCC) 08/29/2021   Insomnia 06/24/2021   Candida lusitanea infection 03/16/2021   Chronic kidney disease (CKD) stage G3b/A2, moderately decreased glomerular filtration rate (GFR) between 30-44 mL/min/1.73 square meter and albuminuria creatinine ratio between 30-299 mg/g (HCC) 03/16/2021   Acute midline low back pain without sciatica 02/24/2021   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   Tachycardia 02/05/2021   Itching in the vaginal area 02/05/2021   Generalized anxiety disorder 02/05/2021   Depression, major, single episode, mild (HCC) 02/05/2021   Heart palpitations 02/05/2021   Adhesive capsulitis of right shoulder 02/05/2021   Urinary frequency 02/05/2021   Type 2 diabetes mellitus with  hyperglycemia, without long-term current use of insulin (HCC) 02/05/2021   Postoperative hypothyroidism 02/05/2021   Hyperlipidemia associated with type 2 diabetes mellitus (HCC) 02/05/2021   Intractable migraine without aura and without status migrainosus 02/05/2021   Seasonal allergic rhinitis due to pollen 02/05/2021   Hiatal hernia with GERD 02/27/2020   Lump of right breast 11/28/2019   Angina pectoris (HCC) 10/12/2015   Kidney stone 12/16/2014    PCP: Tollie Eth, NP  REFERRING PROVIDER: Tollie Eth, NP  REFERRING DIAG: M75.01 (ICD-10-CM) - Adhesive capsulitis  of right shoulder  THERAPY DIAG:  Stiffness of right shoulder, not elsewhere classified  Right shoulder pain, unspecified chronicity  Muscle weakness (generalized)  Rationale for Evaluation and Treatment: Rehabilitation  ONSET DATE: MD order 01/17/2023 ; chronic sx's  SUBJECTIVE:                                                                                                                                                                                      SUBJECTIVE STATEMENT: Pt had bilat thumb soreness this AM which she thinks is from holding the aquatic dumbbells during the aquatic class.  Pt denies any adverse effects after prior Rx.  Pt denies pain currently.  She has been compliant with HEP.    Hand dominance: Right  PERTINENT HISTORY: DM type 2 R THA in 2022 depression Anemia   PAIN:  NPRS:  0/10 current and best, 4-5/10 worst Location:  R shoulder  PRECAUTIONS: Other: R THA  WEIGHT BEARING RESTRICTIONS: No  FALLS:  Has patient fallen in last 6 months? No  LIVING ENVIRONMENT: Lives with: lives alone   OCCUPATION: Pt is retired  PLOF: Independent.  Pt has had chronic stiffness in shoulder which has affected reaching and daily activities.   PATIENT GOALS:to be able to reach normally and to do the same activities on R that she does with L  NEXT MD VISIT:   OBJECTIVE:   DIAGNOSTIC FINDINGS:  N/A     TODAY'S TREATMENT:  Manual Therapy: -PROM right shoulder in flex, abd, ER and IR GHJ mobilizations with posterior and inferior glide grade 2-3  Therapeutic Exercise: Pulleys 2 minutes each flexion and scaption Supine serratus punch 2x10 Supine shoulder ABC x 1 rep Supine wand flexion 2 pound bar 2 x 10 Side-lying ER 2 x 10 1lb Seated wand ER 2x10 Supine wall walks in flexion x 10 reps Scapular row red  Thera-Band 3 x 10 Shoulder extension red Thera-Band 3 x 10 Standing wand IR   PATIENT EDUCATION: Education details: dx, HEP, POC, rationale of exercises, objective findings, and relevant anatomy.  Person educated: Patient Education method: Explanation, Demonstration, Tactile cues, Verbal cues, and Handouts Education comprehension: verbalized understanding, returned demonstration, verbal cues required, tactile cues required, and needs further education  HOME EXERCISE PROGRAM: Access Code: [email or web address] URL: [email or web address] Date: 02/15/2023 Prepared by: Aaron Edelman  Exercises - Supine Shoulder Wand flexion AAROM  - 2 x daily - 7 x weekly - 2 sets - 10 reps - Seated Shoulder External Rotation AAROM with Cane and Hand in Neutral  - 2 x daily - 7 x weekly - 2 sets - 10 reps  ASSESSMENT:  CLINICAL IMPRESSION: Pt is progressing well with shoulder ROM.  She continues to have tightness with ER and IR.  Pt performed exercises well with cuing and instruction in correct form including keeping elbow at side and maintaining the correct plane with ER.  Pt tolerated MT and ther ex well.  She responded well to Rx having no c/o's and no pain after Rx.  She should cont to benefit from cont skilled PT services to address ongoing goals and impairments and to assist in restoring desired level of function.   OBJECTIVE IMPAIRMENTS: decreased ROM, decreased strength, hypomobility, impaired flexibility, impaired UE functional use, and pain.   ACTIVITY LIMITATIONS: lifting, dressing, and reach over head  PARTICIPATION LIMITATIONS: cleaning  PERSONAL FACTORS: Time since onset of injury/illness/exacerbation and 1-2 comorbidities: DM type 2 and anemia  are also affecting patient's functional outcome.   REHAB POTENTIAL: Good  CLINICAL DECISION MAKING: Stable/uncomplicated  EVALUATION COMPLEXITY: Low   GOALS:   SHORT TERM GOALS: Target date: 03/08/2023    Pt will be independent and  compliant with HEP for improved ROM, strength, pain, and function. Baseline: Goal status: INITIAL  2.  Pt will demo at least a 10 deg increase in flexion, scaption, abd, ER, and IR AROM for improved stiffness and reaching.  Baseline:  Goal status: INITIAL  3.  Pt will report at least a 25% improvement in reaching activities.  Baseline:  Goal status: INITIAL    LONG TERM GOALS: Target date: 03/29/2023  Pt will report increased usage of R UE with household cleaning and reaching into cabinets and decreased compensatory usage of L UE.  Baseline:  Goal status: INITIAL  2.  Pt will report she is reaching into overhead cabinets with R UE without difficulty.   Baseline:  Goal status: INITIAL  3.  Pt will report she is able to dress without significant pain and difficulty.  Baseline:  Goal status: INITIAL  4.  Pt will demo improved R shoulder AROM to at least 140 deg in flexion and scaption, 60 deg in ER, and 55 deg in IR for improved performance of ADLs/IADLs and reaching activities.  Baseline:  Goal status: INITIAL   PLAN:  PT FREQUENCY: 2x/week  PT DURATION: 6 weeks  PLANNED INTERVENTIONS: Therapeutic exercises, Therapeutic activity, Neuromuscular re-education, Patient/Family education, Self Care, Joint mobilization, Aquatic Therapy, Dry  Needling, Electrical stimulation, Spinal mobilization, Cryotherapy, Moist heat, Taping, Ultrasound, Manual therapy, and Re-evaluation  PLAN FOR NEXT SESSION: Review and perform HEP.  Cont with shoulder ROM, strengthening and joint mobs.   Audie Clear III PT, DPT 02/25/23 3:23 PM

## 2023-02-25 ENCOUNTER — Encounter (HOSPITAL_BASED_OUTPATIENT_CLINIC_OR_DEPARTMENT_OTHER): Payer: Self-pay | Admitting: Physical Therapy

## 2023-02-25 ENCOUNTER — Ambulatory Visit (AMBULATORY_SURGERY_CENTER): Payer: HMO

## 2023-02-25 VITALS — Ht 65.0 in | Wt 147.6 lb

## 2023-02-25 DIAGNOSIS — K625 Hemorrhage of anus and rectum: Secondary | ICD-10-CM

## 2023-02-25 DIAGNOSIS — Z9889 Other specified postprocedural states: Secondary | ICD-10-CM

## 2023-02-25 DIAGNOSIS — K5904 Chronic idiopathic constipation: Secondary | ICD-10-CM

## 2023-02-25 DIAGNOSIS — Z86010 Personal history of colonic polyps: Secondary | ICD-10-CM

## 2023-02-25 DIAGNOSIS — K219 Gastro-esophageal reflux disease without esophagitis: Secondary | ICD-10-CM

## 2023-02-25 MED ORDER — NA SULFATE-K SULFATE-MG SULF 17.5-3.13-1.6 GM/177ML PO SOLN: 1.0000 | Freq: Once | ORAL | 0 refills | Status: AC

## 2023-02-25 NOTE — Progress Notes (Signed)
No egg or soy allergy known to patient  No issues known to pt with past sedation with any surgeries or procedures Patient denies ever being told they had issues or difficulty with intubation  No FH of Malignant Hyperthermia Pt is not on diet pills Pt is not on  home 02  Pt is not on blood thinners  Pt reports constipation BM everyday but requires straining takes OTC stool softner and probiotics 2 day prep given  No A fib or A flutter Have any cardiac testing pending-- no  Pt is ambulatory   PV completed with patient. Pre instructions reviewed and sent via mychart. Hard copy given at visit. Clenpiq not avaiable at pt pharmacy suprep given as alternate. Goodrx coupon given for SVS in target if needed.  Pt instructed to use Singlecare.com or GoodRx for a price reduction on prep

## 2023-03-01 ENCOUNTER — Ambulatory Visit (HOSPITAL_BASED_OUTPATIENT_CLINIC_OR_DEPARTMENT_OTHER): Payer: HMO

## 2023-03-01 ENCOUNTER — Encounter (HOSPITAL_BASED_OUTPATIENT_CLINIC_OR_DEPARTMENT_OTHER): Payer: Self-pay

## 2023-03-02 ENCOUNTER — Ambulatory Visit (HOSPITAL_BASED_OUTPATIENT_CLINIC_OR_DEPARTMENT_OTHER): Payer: HMO | Admitting: Physical Therapy

## 2023-03-02 DIAGNOSIS — M25611 Stiffness of right shoulder, not elsewhere classified: Secondary | ICD-10-CM | POA: Diagnosis not present

## 2023-03-02 DIAGNOSIS — M6281 Muscle weakness (generalized): Secondary | ICD-10-CM

## 2023-03-02 DIAGNOSIS — M25511 Pain in right shoulder: Secondary | ICD-10-CM

## 2023-03-02 NOTE — Therapy (Signed)
OUTPATIENT PHYSICAL THERAPY SHOULDER TREATMENT   Patient Name: Bianca Phillips MRN: [id number] DOB:1956-04-27, 67 y.o., female Today's Date: 03/03/2023  END OF SESSION:  PT End of Session - 03/02/23 1115     Visit Number 5    Number of Visits 12    Date for PT Re-Evaluation 03/29/23    Authorization Type HTA    PT Start Time 1106    PT Stop Time 1147    PT Time Calculation (min) 41 min    Activity Tolerance Patient tolerated treatment well    Behavior During Therapy [facility] for tasks assessed/performed               Past Medical History:  Diagnosis Date   Complication of anesthesia    Constipation    GAD (generalized anxiety disorder)    Heart murmur    Heart palpitations    evaluated by cardiologist--- dr h. Shari Prows, note in epic 04-03-2021, normal echo and monitor showed NSR w/ SVT   History of COVID-19    per pt had covid twice in 12/ 2020 with mild symptoms with residual intermittant loss of taste;  and 04/ 2022 with mild symptoms that resolved   History of hyperthyroidism    age 7 s/p RAI   History of kidney stones    History of recurrent UTIs    History of repair of hiatal hernia 02/2020   Hyperlipidemia    Hypothyroidism, postablative    age 12  s/p RAI for hyperthyroidism;  followed by pcp   IDA (iron deficiency anemia)    MDD (major depressive disorder)    OA (osteoarthritis)    Right ureteral calculus    Seasonal allergic rhinitis    Type 2 diabetes mellitus (HCC)    followed by pcp    (07-14-2021  per pt checks blood sugar at home 2-3 times weekly,  fasting sugar --120-130)   Urgency of urination    Wears hearing aid in both ears    Past Surgical History:  Procedure Laterality Date   CESAREAN SECTION     x2  last one 1986   CYSTOSCOPY/URETEROSCOPY/HOLMIUM LASER/STENT PLACEMENT  2012   approx   CYSTOSCOPY/URETEROSCOPY/HOLMIUM LASER/STENT PLACEMENT Right 07/20/2021   Procedure: CYSTOSCOPY/RETROGRADE/URETEROSCOPY/HOLMIUM LASER/STENT PLACEMENT;   Surgeon: Jannifer Hick, MD;  Location: [facility];  Service: Urology;  Laterality: Right;  ONLY NEEDS 45 MIN   LAPAROSCOPIC NISSEN FUNDOPLICATION  02/2020   TOTAL ABDOMINAL HYSTERECTOMY W/ BILATERAL SALPINGOOPHORECTOMY  2002   TOTAL HIP ARTHROPLASTY Right 08/30/2021   Procedure: TOTAL HIP ARTHROPLASTY ANTERIOR APPROACH;  Surgeon: Kathryne Hitch, MD;  Location: [facility];  Service: Orthopedics;  Laterality: Right;   Patient Active Problem List   Diagnosis Date Noted   Pain in left shin 01/19/2023   Chronic constipation 01/19/2023   Acute non-recurrent frontal sinusitis 07/14/2022   Closed right hip fracture (HCC) 08/29/2021   Insomnia 06/24/2021   Candida lusitanea infection 03/16/2021   Chronic kidney disease (CKD) stage G3b/A2, moderately decreased glomerular filtration rate (GFR) between 30-44 mL/min/1.73 square meter and albuminuria creatinine ratio between 30-299 mg/g (HCC) 03/16/2021   Acute midline low back pain without sciatica 02/24/2021   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   Tachycardia 02/05/2021   Itching in the vaginal area 02/05/2021   Generalized anxiety disorder 02/05/2021   Depression, major, single episode, mild (HCC) 02/05/2021   Heart palpitations 02/05/2021   Adhesive capsulitis of right shoulder 02/05/2021   Urinary frequency 02/05/2021   Type 2 diabetes mellitus with  hyperglycemia, without long-term current use of insulin (HCC) 02/05/2021   Postoperative hypothyroidism 02/05/2021   Hyperlipidemia associated with type 2 diabetes mellitus (HCC) 02/05/2021   Intractable migraine without aura and without status migrainosus 02/05/2021   Seasonal allergic rhinitis due to pollen 02/05/2021   Hiatal hernia with GERD 02/27/2020   Lump of right breast 11/28/2019   Angina pectoris (HCC) 10/12/2015   Kidney stone 12/16/2014    PCP: Tollie Eth, NP  REFERRING PROVIDER: Tollie Eth, NP  REFERRING DIAG: M75.01 (ICD-10-CM) - Adhesive capsulitis  of right shoulder  THERAPY DIAG:  Stiffness of right shoulder, not elsewhere classified  Right shoulder pain, unspecified chronicity  Muscle weakness (generalized)  Rationale for Evaluation and Treatment: Rehabilitation  ONSET DATE: MD order 01/17/2023 ; chronic sx's  SUBJECTIVE:                                                                                                                                                                                      SUBJECTIVE STATEMENT: Pt denies any adverse effects after prior Rx.  Pt denies pain currently.  She has been compliant with HEP.    Hand dominance: Right  PERTINENT HISTORY: DM type 2 R THA in 2022 depression Anemia   PAIN:  NPRS:  0/10 current and best, 4-5/10 worst Location:  R shoulder  PRECAUTIONS: Other: R THA  WEIGHT BEARING RESTRICTIONS: No  FALLS:  Has patient fallen in last 6 months? No  LIVING ENVIRONMENT: Lives with: lives alone   OCCUPATION: Pt is retired  PLOF: Independent.  Pt has had chronic stiffness in shoulder which has affected reaching and daily activities.   PATIENT GOALS:to be able to reach normally and to do the same activities on R that she does with L  NEXT MD VISIT:   OBJECTIVE:   DIAGNOSTIC FINDINGS:  N/A     TODAY'S TREATMENT:  Manual Therapy: -PROM right shoulder in flex, abd, ER and IR GHJ mobilizations with posterior and inferior glide grade 2-3  Therapeutic Exercise: Pulleys 2 minutes each flexion and scaption Supine serratus punch 2# approx 5 reps and 1# x10 Supine shoulder ABC x 1 rep Supine wand flexion 2 pound bar 2 x 10 Side-lying ER 2 x 10 1lb Supine wand ER 2x10 Supine wall walks in flexion x 10 reps Scapular row red Thera-Band 3 x 10 Shoulder extension red Thera-Band 3 x 10 ER/IR with arm at side with RTB  2x10 Standing wand IR 2x10  AROM/PROM:   ER:  40/46 deg IR:  55            Flexion:  137 deg   PATIENT EDUCATION: Education details: dx, HEP, POC, rationale of exercises, objective findings, and relevant anatomy.  Person educated: Patient Education method: Explanation, Demonstration, Tactile cues, Verbal cues, and Handouts Education comprehension: verbalized understanding, returned demonstration, verbal cues required, tactile cues required, and needs further education  HOME EXERCISE PROGRAM: Access Code: [email or web address] URL: [email or web address] Date: 02/15/2023 Prepared by: Aaron Edelman  Exercises - Supine Shoulder Wand flexion AAROM  - 2 x daily - 7 x weekly - 2 sets - 10 reps - Seated Shoulder External Rotation AAROM with Cane and Hand in Neutral  - 2 x daily - 7 x weekly - 2 sets - 10 reps  ASSESSMENT:  CLINICAL IMPRESSION: Pt is progressing well with shoulder ROM demonstrating improved flex, ER, and IR AROM as evidenced by goniometric measurements.  Pt partially met STG #2.  She continues to have tightness with shoulder in all planes especially ER though is improving.  Pt performed exercises well with cuing and instruction in correct form.  She does require cuing for correct form and positioning with ER exercises.  She responded well to Rx having no c/o's and no pain after Rx.  She should cont to benefit from cont skilled PT services to address ongoing goals and impairments and to assist in restoring desired level of function.    OBJECTIVE IMPAIRMENTS: decreased ROM, decreased strength, hypomobility, impaired flexibility, impaired UE functional use, and pain.   ACTIVITY LIMITATIONS: lifting, dressing, and reach over head  PARTICIPATION LIMITATIONS: cleaning  PERSONAL FACTORS: Time since onset of injury/illness/exacerbation and 1-2 comorbidities: DM type 2 and anemia  are also affecting patient's functional outcome.   REHAB POTENTIAL: Good  CLINICAL DECISION  MAKING: Stable/uncomplicated  EVALUATION COMPLEXITY: Low   GOALS:   SHORT TERM GOALS: Target date: 03/08/2023    Pt will be independent and compliant with HEP for improved ROM, strength, pain, and function. Baseline: Goal status: INITIAL  2.  Pt will demo at least a 10 deg increase in flexion, scaption, abd, ER, and IR AROM for improved stiffness and reaching.  Baseline:  Goal status: PARTIALLY MET 5/29  3.  Pt will report at least a 25% improvement in reaching activities.  Baseline:  Goal status: INITIAL    LONG TERM GOALS: Target date: 03/29/2023  Pt will report increased usage of R UE with household cleaning and reaching into cabinets and decreased compensatory usage of L UE.  Baseline:  Goal status: INITIAL  2.  Pt will report she is reaching into overhead cabinets with R UE without difficulty.   Baseline:  Goal status: INITIAL  3.  Pt will report she is able to dress without significant pain and difficulty.  Baseline:  Goal status: INITIAL  4.  Pt will demo improved R shoulder AROM to  at least 140 deg in flexion and scaption, 60 deg in ER, and 55 deg in IR for improved performance of ADLs/IADLs and reaching activities.  Baseline:  Goal status: INITIAL   PLAN:  PT FREQUENCY: 2x/week  PT DURATION: 6 weeks  PLANNED INTERVENTIONS: Therapeutic exercises, Therapeutic activity, Neuromuscular re-education, Patient/Family education, Self Care, Joint mobilization, Aquatic Therapy, Dry Needling, Electrical stimulation, Spinal mobilization, Cryotherapy, Moist heat, Taping, Ultrasound, Manual therapy, and Re-evaluation  PLAN FOR NEXT SESSION: Review and perform HEP.  Cont with shoulder ROM, strengthening and joint mobs.   Audie Clear III PT, DPT 03/03/23 8:46 AM

## 2023-03-03 ENCOUNTER — Encounter (HOSPITAL_BASED_OUTPATIENT_CLINIC_OR_DEPARTMENT_OTHER): Payer: Self-pay | Admitting: Physical Therapy

## 2023-03-04 ENCOUNTER — Encounter (HOSPITAL_BASED_OUTPATIENT_CLINIC_OR_DEPARTMENT_OTHER): Payer: Self-pay | Admitting: Physical Therapy

## 2023-03-04 ENCOUNTER — Ambulatory Visit (HOSPITAL_BASED_OUTPATIENT_CLINIC_OR_DEPARTMENT_OTHER): Payer: HMO | Admitting: Physical Therapy

## 2023-03-04 DIAGNOSIS — M6281 Muscle weakness (generalized): Secondary | ICD-10-CM

## 2023-03-04 DIAGNOSIS — M25611 Stiffness of right shoulder, not elsewhere classified: Secondary | ICD-10-CM | POA: Diagnosis not present

## 2023-03-04 DIAGNOSIS — M25511 Pain in right shoulder: Secondary | ICD-10-CM

## 2023-03-04 NOTE — Therapy (Signed)
OUTPATIENT PHYSICAL THERAPY SHOULDER TREATMENT   Patient Name: Bianca Phillips MRN: [id number] DOB:03-Apr-1956, 67 y.o., female Today's Date: 03/04/2023  END OF SESSION:  PT End of Session - 03/04/23 1113     Visit Number 6    Number of Visits 12    Date for PT Re-Evaluation 03/29/23    Authorization Type HTA    PT Start Time 1110    PT Stop Time 1148    PT Time Calculation (min) 38 min    Activity Tolerance Patient tolerated treatment well    Behavior During Therapy WFL for tasks assessed/performed               Past Medical History:  Diagnosis Date   Complication of anesthesia    Constipation    GAD (generalized anxiety disorder)    Heart murmur    Heart palpitations    evaluated by cardiologist--- dr h. Shari Prows, note in epic 04-03-2021, normal echo and monitor showed NSR w/ SVT   History of COVID-19    per pt had covid twice in 12/ 2020 with mild symptoms with residual intermittant loss of taste;  and 04/ 2022 with mild symptoms that resolved   History of hyperthyroidism    age 25 s/p RAI   History of kidney stones    History of recurrent UTIs    History of repair of hiatal hernia 02/2020   Hyperlipidemia    Hypothyroidism, postablative    age 70  s/p RAI for hyperthyroidism;  followed by pcp   IDA (iron deficiency anemia)    MDD (major depressive disorder)    OA (osteoarthritis)    Right ureteral calculus    Seasonal allergic rhinitis    Type 2 diabetes mellitus (HCC)    followed by pcp    (07-14-2021  per pt checks blood sugar at home 2-3 times weekly,  fasting sugar --120-130)   Urgency of urination    Wears hearing aid in both ears    Past Surgical History:  Procedure Laterality Date   CESAREAN SECTION     x2  last one 1986   CYSTOSCOPY/URETEROSCOPY/HOLMIUM LASER/STENT PLACEMENT  2012   approx   CYSTOSCOPY/URETEROSCOPY/HOLMIUM LASER/STENT PLACEMENT Right 07/20/2021   Procedure: CYSTOSCOPY/RETROGRADE/URETEROSCOPY/HOLMIUM LASER/STENT PLACEMENT;   Surgeon: Jannifer Hick, MD;  Location: [facility];  Service: Urology;  Laterality: Right;  ONLY NEEDS 45 MIN   LAPAROSCOPIC NISSEN FUNDOPLICATION  02/2020   TOTAL ABDOMINAL HYSTERECTOMY W/ BILATERAL SALPINGOOPHORECTOMY  2002   TOTAL HIP ARTHROPLASTY Right 08/30/2021   Procedure: TOTAL HIP ARTHROPLASTY ANTERIOR APPROACH;  Surgeon: Kathryne Hitch, MD;  Location: [facility];  Service: Orthopedics;  Laterality: Right;   Patient Active Problem List   Diagnosis Date Noted   Pain in left shin 01/19/2023   Chronic constipation 01/19/2023   Acute non-recurrent frontal sinusitis 07/14/2022   Closed right hip fracture (HCC) 08/29/2021   Insomnia 06/24/2021   Candida lusitanea infection 03/16/2021   Chronic kidney disease (CKD) stage G3b/A2, moderately decreased glomerular filtration rate (GFR) between 30-44 mL/min/1.73 square meter and albuminuria creatinine ratio between 30-299 mg/g (HCC) 03/16/2021   Acute midline low back pain without sciatica 02/24/2021   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   Tachycardia 02/05/2021   Itching in the vaginal area 02/05/2021   Generalized anxiety disorder 02/05/2021   Depression, major, single episode, mild (HCC) 02/05/2021   Heart palpitations 02/05/2021   Adhesive capsulitis of right shoulder 02/05/2021   Urinary frequency 02/05/2021   Type 2 diabetes mellitus with  hyperglycemia, without long-term current use of insulin (HCC) 02/05/2021   Postoperative hypothyroidism 02/05/2021   Hyperlipidemia associated with type 2 diabetes mellitus (HCC) 02/05/2021   Intractable migraine without aura and without status migrainosus 02/05/2021   Seasonal allergic rhinitis due to pollen 02/05/2021   Hiatal hernia with GERD 02/27/2020   Lump of right breast 11/28/2019   Angina pectoris (HCC) 10/12/2015   Kidney stone 12/16/2014    PCP: Tollie Eth, NP  REFERRING PROVIDER: Tollie Eth, NP  REFERRING DIAG: M75.01 (ICD-10-CM) - Adhesive capsulitis  of right shoulder  THERAPY DIAG:  Stiffness of right shoulder, not elsewhere classified  Right shoulder pain, unspecified chronicity  Muscle weakness (generalized)  Rationale for Evaluation and Treatment: Rehabilitation  ONSET DATE: MD order 01/17/2023 ; chronic sx's  SUBJECTIVE:                                                                                                                                                                                      SUBJECTIVE STATEMENT: Pt denies any adverse effects after prior Rx.  Pt denies pain currently.  She has been compliant with HEP.  Pt reports improved reaching behind back and is able to tuck her back pocket in.     Hand dominance: Right  PERTINENT HISTORY: DM type 2 R THA in 2022 depression Anemia   PAIN:  NPRS:  0/10 current and best, 4-5/10 worst Location:  R shoulder  PRECAUTIONS: Other: R THA  WEIGHT BEARING RESTRICTIONS: No  FALLS:  Has patient fallen in last 6 months? No  LIVING ENVIRONMENT: Lives with: lives alone   OCCUPATION: Pt is retired  PLOF: Independent.  Pt has had chronic stiffness in shoulder which has affected reaching and daily activities.   PATIENT GOALS:to be able to reach normally and to do the same activities on R that she does with L  NEXT MD VISIT:   OBJECTIVE:   DIAGNOSTIC FINDINGS:  N/A     TODAY'S TREATMENT:  Manual Therapy: -PROM right shoulder in flex, abd, ER and IR GHJ mobilizations with posterior and inferior glide grade 2-3  Therapeutic Exercise: Pulleys approx 20 reps in flexion, scaption, and abd Supine serratus punch 2# approx 5 reps and 1# x10 Supine shoulder ABC x 1 rep Side-lying ER 2 x 10 1lb Supine wand ER 2x10 Anterior capsule stretch in doorway 3x10 sec Standing wall walks in flexion x 10 reps Scapular row green  Thera-Band 3 x 10 Shoulder extension green Thera-Band 3 x 10 ER/IR with arm at side with RTB 2x10 Standing wand IR 2x10   PATIENT EDUCATION: Education details: dx, HEP, POC, rationale of exercises, exercise form, and relevant anatomy.  Person educated: Patient Education method: Explanation, Demonstration, Tactile cues, Verbal cues, and Handouts Education comprehension: verbalized understanding, returned demonstration, verbal cues required, tactile cues required, and needs further education  HOME EXERCISE PROGRAM: Access Code: [email or web address] URL: [email or web address] Date: 02/15/2023 Prepared by: Aaron Edelman  Exercises - Supine Shoulder Wand flexion AAROM  - 2 x daily - 7 x weekly - 2 sets - 10 reps - Seated Shoulder External Rotation AAROM with Cane and Hand in Neutral  - 2 x daily - 7 x weekly - 2 sets - 10 reps  ASSESSMENT:  CLINICAL IMPRESSION: Pt continues to have tightness with shoulder in all planes especially ER though is making progress.  Pt is improving with ROM t/o R shoulder.  Pt reports improved reaching behind back and is able to tuck her back pocket in.  Pt performed exercises well with cuing and instruction in correct form.  She does require cuing for correct form and positioning with ER exercises.  Pt required instruction for correct form with doorway ant capsule stretch and had some limitation with that stretch.  She responded well to Rx having no pain after Rx.  She should cont to benefit from cont skilled PT services to address ongoing goals and impairments and to assist in restoring desired level of function.    OBJECTIVE IMPAIRMENTS: decreased ROM, decreased strength, hypomobility, impaired flexibility, impaired UE functional use, and pain.   ACTIVITY LIMITATIONS: lifting, dressing, and reach over head  PARTICIPATION LIMITATIONS: cleaning  PERSONAL FACTORS: Time since onset of injury/illness/exacerbation and 1-2 comorbidities: DM type 2 and anemia   are also affecting patient's functional outcome.   REHAB POTENTIAL: Good  CLINICAL DECISION MAKING: Stable/uncomplicated  EVALUATION COMPLEXITY: Low   GOALS:   SHORT TERM GOALS: Target date: 03/08/2023    Pt will be independent and compliant with HEP for improved ROM, strength, pain, and function. Baseline: Goal status: INITIAL  2.  Pt will demo at least a 10 deg increase in flexion, scaption, abd, ER, and IR AROM for improved stiffness and reaching.  Baseline:  Goal status: PARTIALLY MET 5/29  3.  Pt will report at least a 25% improvement in reaching activities.  Baseline:  Goal status: INITIAL    LONG TERM GOALS: Target date: 03/29/2023  Pt will report increased usage of R UE with household cleaning and reaching into cabinets and decreased compensatory usage of L UE.  Baseline:  Goal status: INITIAL  2.  Pt will report she is reaching into overhead cabinets with R UE without difficulty.   Baseline:  Goal status: INITIAL  3.  Pt will report she is able to dress without significant pain and difficulty.  Baseline:  Goal status: INITIAL  4.  Pt will demo improved R shoulder AROM to at least 140 deg in flexion and scaption, 60 deg  in ER, and 55 deg in IR for improved performance of ADLs/IADLs and reaching activities.  Baseline:  Goal status: INITIAL   PLAN:  PT FREQUENCY: 2x/week  PT DURATION: 6 weeks  PLANNED INTERVENTIONS: Therapeutic exercises, Therapeutic activity, Neuromuscular re-education, Patient/Family education, Self Care, Joint mobilization, Aquatic Therapy, Dry Needling, Electrical stimulation, Spinal mobilization, Cryotherapy, Moist heat, Taping, Ultrasound, Manual therapy, and Re-evaluation  PLAN FOR NEXT SESSION: Review and update HEP.  Cont with shoulder ROM, strengthening and joint mobs.   Audie Clear III PT, DPT 03/04/23 6:29 PM

## 2023-03-08 ENCOUNTER — Ambulatory Visit (HOSPITAL_BASED_OUTPATIENT_CLINIC_OR_DEPARTMENT_OTHER): Payer: HMO | Attending: Nurse Practitioner | Admitting: Physical Therapy

## 2023-03-08 DIAGNOSIS — M6281 Muscle weakness (generalized): Secondary | ICD-10-CM | POA: Insufficient documentation

## 2023-03-08 DIAGNOSIS — M25611 Stiffness of right shoulder, not elsewhere classified: Secondary | ICD-10-CM | POA: Insufficient documentation

## 2023-03-08 DIAGNOSIS — M25511 Pain in right shoulder: Secondary | ICD-10-CM | POA: Diagnosis not present

## 2023-03-08 NOTE — Therapy (Signed)
OUTPATIENT PHYSICAL THERAPY SHOULDER TREATMENT   Patient Name: Bianca Phillips MRN: [id number] DOB:Mar 30, 1956, 67 y.o., female Today's Date: 03/09/2023  END OF SESSION:  PT End of Session - 03/08/23 1455     Visit Number 7    Number of Visits 12    Date for PT Re-Evaluation 03/29/23    Authorization Type HTA    PT Start Time 1402    PT Stop Time 1450    PT Time Calculation (min) 48 min    Activity Tolerance Patient tolerated treatment well    Behavior During Therapy [facility] for tasks assessed/performed                Past Medical History:  Diagnosis Date   Complication of anesthesia    Constipation    GAD (generalized anxiety disorder)    Heart murmur    Heart palpitations    evaluated by cardiologist--- dr h. Shari Prows, note in epic 04-03-2021, normal echo and monitor showed NSR w/ SVT   History of COVID-19    per pt had covid twice in 12/ 2020 with mild symptoms with residual intermittant loss of taste;  and 04/ 2022 with mild symptoms that resolved   History of hyperthyroidism    age 51 s/p RAI   History of kidney stones    History of recurrent UTIs    History of repair of hiatal hernia 02/2020   Hyperlipidemia    Hypothyroidism, postablative    age 28  s/p RAI for hyperthyroidism;  followed by pcp   IDA (iron deficiency anemia)    MDD (major depressive disorder)    OA (osteoarthritis)    Right ureteral calculus    Seasonal allergic rhinitis    Type 2 diabetes mellitus (HCC)    followed by pcp    (07-14-2021  per pt checks blood sugar at home 2-3 times weekly,  fasting sugar --120-130)   Urgency of urination    Wears hearing aid in both ears    Past Surgical History:  Procedure Laterality Date   CESAREAN SECTION     x2  last one 1986   CYSTOSCOPY/URETEROSCOPY/HOLMIUM LASER/STENT PLACEMENT  2012   approx   CYSTOSCOPY/URETEROSCOPY/HOLMIUM LASER/STENT PLACEMENT Right 07/20/2021   Procedure: CYSTOSCOPY/RETROGRADE/URETEROSCOPY/HOLMIUM LASER/STENT PLACEMENT;   Surgeon: Jannifer Hick, MD;  Location: [facility];  Service: Urology;  Laterality: Right;  ONLY NEEDS 45 MIN   LAPAROSCOPIC NISSEN FUNDOPLICATION  02/2020   TOTAL ABDOMINAL HYSTERECTOMY W/ BILATERAL SALPINGOOPHORECTOMY  2002   TOTAL HIP ARTHROPLASTY Right 08/30/2021   Procedure: TOTAL HIP ARTHROPLASTY ANTERIOR APPROACH;  Surgeon: Kathryne Hitch, MD;  Location: [facility];  Service: Orthopedics;  Laterality: Right;   Patient Active Problem List   Diagnosis Date Noted   Pain in left shin 01/19/2023   Chronic constipation 01/19/2023   Acute non-recurrent frontal sinusitis 07/14/2022   Closed right hip fracture (HCC) 08/29/2021   Insomnia 06/24/2021   Candida lusitanea infection 03/16/2021   Chronic kidney disease (CKD) stage G3b/A2, moderately decreased glomerular filtration rate (GFR) between 30-44 mL/min/1.73 square meter and albuminuria creatinine ratio between 30-299 mg/g (HCC) 03/16/2021   Acute midline low back pain without sciatica 02/24/2021   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   Tachycardia 02/05/2021   Itching in the vaginal area 02/05/2021   Generalized anxiety disorder 02/05/2021   Depression, major, single episode, mild (HCC) 02/05/2021   Heart palpitations 02/05/2021   Adhesive capsulitis of right shoulder 02/05/2021   Urinary frequency 02/05/2021   Type 2 diabetes mellitus  with hyperglycemia, without long-term current use of insulin (HCC) 02/05/2021   Postoperative hypothyroidism 02/05/2021   Hyperlipidemia associated with type 2 diabetes mellitus (HCC) 02/05/2021   Intractable migraine without aura and without status migrainosus 02/05/2021   Seasonal allergic rhinitis due to pollen 02/05/2021   Hiatal hernia with GERD 02/27/2020   Lump of right breast 11/28/2019   Angina pectoris (HCC) 10/12/2015   Kidney stone 12/16/2014    PCP: Tollie Eth, NP  REFERRING PROVIDER: Tollie Eth, NP  REFERRING DIAG: M75.01 (ICD-10-CM) - Adhesive capsulitis  of right shoulder  THERAPY DIAG:  Stiffness of right shoulder, not elsewhere classified  Right shoulder pain, unspecified chronicity  Muscle weakness (generalized)  Rationale for Evaluation and Treatment: Rehabilitation  ONSET DATE: MD order 01/17/2023 ; chronic sx's  SUBJECTIVE:                                                                                                                                                                                      SUBJECTIVE STATEMENT: Pt denies any adverse effects after prior Rx.  Pt denies pain currently.  She has been compliant with HEP.  Pt is going out of town tomorrow and will be out of town for a couple of weeks.   Hand dominance: Right  PERTINENT HISTORY: DM type 2 R THA in 2022 depression Anemia   PAIN:  NPRS:  0/10 current and best, 4-5/10 worst Location:  R shoulder  PRECAUTIONS: Other: R THA  WEIGHT BEARING RESTRICTIONS: No  FALLS:  Has patient fallen in last 6 months? No  LIVING ENVIRONMENT: Lives with: lives alone   OCCUPATION: Pt is retired  PLOF: Independent.  Pt has had chronic stiffness in shoulder which has affected reaching and daily activities.   PATIENT GOALS:to be able to reach normally and to do the same activities on R that she does with L  NEXT MD VISIT:   OBJECTIVE:   DIAGNOSTIC FINDINGS:  N/A     TODAY'S TREATMENT:  Manual Therapy: -PROM right shoulder in flex, abd, ER and IR GHJ mobilizations with posterior and inferior glide grade 2-3  Therapeutic Exercise: Pulleys approx 20 reps in flexion, scaption, and abd Supine serratus punch 1# 2x10 Supine shoulder ABC x 1 rep with 1# Side-lying ER 2 x 10 1lb Supine wand ER 2x10 Anterior capsule stretch in doorway 3x10 sec Standing wall walks in flexion 2 x 10 reps Scapular row green Thera-Band 3 x  10 Shoulder extension green Thera-Band 3 x 10 Standing wand IR 2x10  Pt received a HEP handout and was educated in correct form and appropriate frequency.  Pt instructed she should not have increased pain with HEP.     PATIENT EDUCATION: Education details: dx, HEP, POC, rationale of exercises, HEP, exercise form, and relevant anatomy.  Person educated: Patient Education method: Explanation, Demonstration, Tactile cues, Verbal cues, and Handouts Education comprehension: verbalized understanding, returned demonstration, verbal cues required, tactile cues required, and needs further education  HOME EXERCISE PROGRAM: Access Code: [email or web address] URL: [email or web address] Date: 02/15/2023 Prepared by: Aaron Edelman  Exercises - Supine Shoulder Wand flexion AAROM  - 2 x daily - 7 x weekly - 2 sets - 10 reps - Seated Shoulder External Rotation AAROM with Cane and Hand in Neutral  - 2 x daily - 7 x weekly - 2 sets - 10 reps  Updated HEP: - Standing Shoulder Flexion Wall Walk  - 2 x daily - 7 x weekly - 1 sets - 10 reps - Standing Shoulder Row with Anchored Resistance  - 1 x daily - 4 x weekly - 2-3 sets - 10 reps - Single Arm Serratus Punches  - 1 x daily - 4-5 x weekly - 2 sets - 10 reps - Supine Shoulder Alphabet  - 1 x daily - 7 x weekly - 1 reps - Supine Shoulder External Rotation in 45 Degrees Abduction AAROM with Dowel  - 2 x daily - 7 x weekly - 2 sets - 10 reps - Sidelying Shoulder External Rotation  - 1 x daily - 3 x weekly - 2 sets - 10 reps - Standing Bilateral Shoulder Internal Rotation AAROM with Dowel  - 2 x daily - 7 x weekly - 2 sets - 10 reps  ASSESSMENT:  CLINICAL IMPRESSION: Pt continues to have tightness with shoulder in all planes especially ER though is making progress.  Pt is improving with ROM t/o R shoulder.  Pt performed exercises well with cuing and instruction in correct form and positioning.  Pt has difficulty performing doorway ant capsule stretch  requiring increased instruction for correct form.  PT updated HEP and spent time going through updated exercises.  Pt demonstrates good understanding of updated HEP.  She responded well to Rx having no pain after Rx.  She should cont to benefit from cont skilled PT services to address ongoing goals and impairments and to assist in restoring desired level of function.    OBJECTIVE IMPAIRMENTS: decreased ROM, decreased strength, hypomobility, impaired flexibility, impaired UE functional use, and pain.   ACTIVITY LIMITATIONS: lifting, dressing, and reach over head  PARTICIPATION LIMITATIONS: cleaning  PERSONAL FACTORS: Time since onset of injury/illness/exacerbation and 1-2 comorbidities: DM type 2 and anemia  are also affecting patient's functional outcome.   REHAB POTENTIAL: Good  CLINICAL DECISION MAKING: Stable/uncomplicated  EVALUATION COMPLEXITY: Low   GOALS:   SHORT TERM GOALS: Target date: 03/08/2023    Pt will be independent and compliant with HEP for improved ROM, strength, pain, and function. Baseline: Goal  status: INITIAL  2.  Pt will demo at least a 10 deg increase in flexion, scaption, abd, ER, and IR AROM for improved stiffness and reaching.  Baseline:  Goal status: PARTIALLY MET 5/29  3.  Pt will report at least a 25% improvement in reaching activities.  Baseline:  Goal status: INITIAL    LONG TERM GOALS: Target date: 03/29/2023  Pt will report increased usage of R UE with household cleaning and reaching into cabinets and decreased compensatory usage of L UE.  Baseline:  Goal status: INITIAL  2.  Pt will report she is reaching into overhead cabinets with R UE without difficulty.   Baseline:  Goal status: INITIAL  3.  Pt will report she is able to dress without significant pain and difficulty.  Baseline:  Goal status: INITIAL  4.  Pt will demo improved R shoulder AROM to at least 140 deg in flexion and scaption, 60 deg in ER, and 55 deg in IR for improved  performance of ADLs/IADLs and reaching activities.  Baseline:  Goal status: INITIAL   PLAN:  PT FREQUENCY: 2x/week  PT DURATION: 6 weeks  PLANNED INTERVENTIONS: Therapeutic exercises, Therapeutic activity, Neuromuscular re-education, Patient/Family education, Self Care, Joint mobilization, Aquatic Therapy, Dry Needling, Electrical stimulation, Spinal mobilization, Cryotherapy, Moist heat, Taping, Ultrasound, Manual therapy, and Re-evaluation  PLAN FOR NEXT SESSION: Cont with shoulder ROM, strengthening and joint mobs.   Audie Clear III PT, DPT 03/09/23 8:42 PM

## 2023-03-09 ENCOUNTER — Encounter (HOSPITAL_BASED_OUTPATIENT_CLINIC_OR_DEPARTMENT_OTHER): Payer: Self-pay | Admitting: Physical Therapy

## 2023-03-15 ENCOUNTER — Encounter (HOSPITAL_BASED_OUTPATIENT_CLINIC_OR_DEPARTMENT_OTHER): Payer: HMO | Admitting: Physical Therapy

## 2023-03-17 ENCOUNTER — Encounter (HOSPITAL_BASED_OUTPATIENT_CLINIC_OR_DEPARTMENT_OTHER): Payer: HMO

## 2023-03-17 ENCOUNTER — Encounter: Payer: Self-pay | Admitting: Gastroenterology

## 2023-03-18 ENCOUNTER — Encounter: Payer: Self-pay | Admitting: Gastroenterology

## 2023-03-18 ENCOUNTER — Encounter: Payer: HMO | Admitting: Gastroenterology

## 2023-03-24 ENCOUNTER — Encounter (HOSPITAL_BASED_OUTPATIENT_CLINIC_OR_DEPARTMENT_OTHER): Payer: HMO

## 2023-03-26 ENCOUNTER — Encounter (HOSPITAL_BASED_OUTPATIENT_CLINIC_OR_DEPARTMENT_OTHER): Payer: Self-pay

## 2023-03-26 ENCOUNTER — Ambulatory Visit (HOSPITAL_BASED_OUTPATIENT_CLINIC_OR_DEPARTMENT_OTHER): Payer: HMO

## 2023-03-26 DIAGNOSIS — M25611 Stiffness of right shoulder, not elsewhere classified: Secondary | ICD-10-CM | POA: Diagnosis not present

## 2023-03-26 DIAGNOSIS — M25511 Pain in right shoulder: Secondary | ICD-10-CM

## 2023-03-26 DIAGNOSIS — M6281 Muscle weakness (generalized): Secondary | ICD-10-CM

## 2023-03-26 NOTE — Therapy (Signed)
OUTPATIENT PHYSICAL THERAPY SHOULDER TREATMENT   Patient Name: Bianca Phillips MRN: [id number] DOB:September 02, 1956, 67 y.o., female Today's Date: 03/26/2023  END OF SESSION:  PT End of Session - 03/26/23 1207     Visit Number 8    Number of Visits 12    Date for PT Re-Evaluation 03/29/23    Authorization Type HTA    PT Start Time 1127    PT Stop Time 1212    PT Time Calculation (min) 45 min    Activity Tolerance Patient tolerated treatment well    Behavior During Therapy [facility] for tasks assessed/performed                Past Medical History:  Diagnosis Date   Complication of anesthesia    Constipation    GAD (generalized anxiety disorder)    Heart murmur    Heart palpitations    evaluated by cardiologist--- dr h. Shari Prows, note in epic 04-03-2021, normal echo and monitor showed NSR w/ SVT   History of COVID-19    per pt had covid twice in 12/ 2020 with mild symptoms with residual intermittant loss of taste;  and 04/ 2022 with mild symptoms that resolved   History of hyperthyroidism    age 31 s/p RAI   History of kidney stones    History of recurrent UTIs    History of repair of hiatal hernia 02/2020   Hyperlipidemia    Hypothyroidism, postablative    age 77  s/p RAI for hyperthyroidism;  followed by pcp   IDA (iron deficiency anemia)    MDD (major depressive disorder)    OA (osteoarthritis)    Right ureteral calculus    Seasonal allergic rhinitis    Type 2 diabetes mellitus (HCC)    followed by pcp    (07-14-2021  per pt checks blood sugar at home 2-3 times weekly,  fasting sugar --120-130)   Urgency of urination    Wears hearing aid in both ears    Past Surgical History:  Procedure Laterality Date   CESAREAN SECTION     x2  last one 1986   CYSTOSCOPY/URETEROSCOPY/HOLMIUM LASER/STENT PLACEMENT  2012   approx   CYSTOSCOPY/URETEROSCOPY/HOLMIUM LASER/STENT PLACEMENT Right 07/20/2021   Procedure: CYSTOSCOPY/RETROGRADE/URETEROSCOPY/HOLMIUM LASER/STENT PLACEMENT;   Surgeon: Jannifer Hick, MD;  Location: [facility];  Service: Urology;  Laterality: Right;  ONLY NEEDS 45 MIN   LAPAROSCOPIC NISSEN FUNDOPLICATION  02/2020   TOTAL ABDOMINAL HYSTERECTOMY W/ BILATERAL SALPINGOOPHORECTOMY  2002   TOTAL HIP ARTHROPLASTY Right 08/30/2021   Procedure: TOTAL HIP ARTHROPLASTY ANTERIOR APPROACH;  Surgeon: Kathryne Hitch, MD;  Location: [facility];  Service: Orthopedics;  Laterality: Right;   Patient Active Problem List   Diagnosis Date Noted   Pain in left shin 01/19/2023   Chronic constipation 01/19/2023   Acute non-recurrent frontal sinusitis 07/14/2022   Closed right hip fracture (HCC) 08/29/2021   Insomnia 06/24/2021   Candida lusitanea infection 03/16/2021   Chronic kidney disease (CKD) stage G3b/A2, moderately decreased glomerular filtration rate (GFR) between 30-44 mL/min/1.73 square meter and albuminuria creatinine ratio between 30-299 mg/g (HCC) 03/16/2021   Acute midline low back pain without sciatica 02/24/2021   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   Tachycardia 02/05/2021   Itching in the vaginal area 02/05/2021   Generalized anxiety disorder 02/05/2021   Depression, major, single episode, mild (HCC) 02/05/2021   Heart palpitations 02/05/2021   Adhesive capsulitis of right shoulder 02/05/2021   Urinary frequency 02/05/2021   Type 2 diabetes mellitus  with hyperglycemia, without long-term current use of insulin (HCC) 02/05/2021   Postoperative hypothyroidism 02/05/2021   Hyperlipidemia associated with type 2 diabetes mellitus (HCC) 02/05/2021   Intractable migraine without aura and without status migrainosus 02/05/2021   Seasonal allergic rhinitis due to pollen 02/05/2021   Hiatal hernia with GERD 02/27/2020   Lump of right breast 11/28/2019   Angina pectoris (HCC) 10/12/2015   Kidney stone 12/16/2014    PCP: Tollie Eth, NP  REFERRING PROVIDER: Tollie Eth, NP  REFERRING DIAG: M75.01 (ICD-10-CM) - Adhesive capsulitis  of right shoulder  THERAPY DIAG:  Stiffness of right shoulder, not elsewhere classified  Right shoulder pain, unspecified chronicity  Muscle weakness (generalized)  Rationale for Evaluation and Treatment: Rehabilitation  ONSET DATE: MD order 01/17/2023 ; chronic sx's  SUBJECTIVE:                                                                                                                                                                                      SUBJECTIVE STATEMENT: Pt reports she was compliant with HEP during her trip to [place]. No pain at entry. Still having trouble hooking her bra.   Hand dominance: Right  PERTINENT HISTORY: DM type 2 R THA in 2022 depression Anemia   PAIN:  NPRS:  0/10 current and best, 4-5/10 worst Location:  R shoulder  PRECAUTIONS: Other: R THA  WEIGHT BEARING RESTRICTIONS: No  FALLS:  Has patient fallen in last 6 months? No  LIVING ENVIRONMENT: Lives with: lives alone   OCCUPATION: Pt is retired  PLOF: Independent.  Pt has had chronic stiffness in shoulder which has affected reaching and daily activities.   PATIENT GOALS:to be able to reach normally and to do the same activities on R that she does with L  NEXT MD VISIT:   OBJECTIVE:   DIAGNOSTIC FINDINGS:  N/A  FOTO 72% on 6/22 (MET GOAL)   TODAY'S TREATMENT:  Manual Therapy: -PROM right shoulder in flex, abd, ER and IR GHJ mobilizations with posterior and inferior glide grade 2-3  Therapeutic Exercise: Pulleys 2' flexion and abduction Supine wand flexion 2lb bar 2x10 Supine serratus punch 1# 2x10 Supine shoulder ABC x 1 rep with 1# Supine active flexion 2# x10 Side-lying ER 2 x 10 2lb Scapular row green Thera-Band 2x15 Shoulder extension green Thera-Band 2x15 Standing wand IR 2x10 Standing bicep curl to OH press 1#  x10 Standing press out 2#2x10 Standing flexion 1# 2x10 Overhead stretch at wall into flexion-5sec hold x10    PATIENT EDUCATION: Education details: dx, HEP, POC, rationale of exercises, HEP, exercise form, and relevant anatomy.  Person educated: Patient Education method: Explanation, Demonstration, Tactile cues, Verbal cues, and Handouts Education comprehension: verbalized understanding, returned demonstration, verbal cues required, tactile cues required, and needs further education  HOME EXERCISE PROGRAM: Access Code: [email or web address] URL: [email or web address] Date: 02/15/2023 Prepared by: Aaron Edelman  Exercises - Supine Shoulder Wand flexion AAROM  - 2 x daily - 7 x weekly - 2 sets - 10 reps - Seated Shoulder External Rotation AAROM with Cane and Hand in Neutral  - 2 x daily - 7 x weekly - 2 sets - 10 reps  Updated HEP: - Standing Shoulder Flexion Wall Walk  - 2 x daily - 7 x weekly - 1 sets - 10 reps - Standing Shoulder Row with Anchored Resistance  - 1 x daily - 4 x weekly - 2-3 sets - 10 reps - Single Arm Serratus Punches  - 1 x daily - 4-5 x weekly - 2 sets - 10 reps - Supine Shoulder Alphabet  - 1 x daily - 7 x weekly - 1 reps - Supine Shoulder External Rotation in 45 Degrees Abduction AAROM with Dowel  - 2 x daily - 7 x weekly - 2 sets - 10 reps - Sidelying Shoulder External Rotation  - 1 x daily - 3 x weekly - 2 sets - 10 reps - Standing Bilateral Shoulder Internal Rotation AAROM with Dowel  - 2 x daily - 7 x weekly - 2 sets - 10 reps  ASSESSMENT:  CLINICAL IMPRESSION: Pt has met FOTO goal. Stiffness present in shoulder with mild improvement noted following manual intervention. Mild discomfort at end range PROM all planes. Pt has met 2/3 STG and 1/4 LTG . Pt to have re-eval next visit. She would benefit from continued ROM interventions to improve function.   OBJECTIVE IMPAIRMENTS: decreased ROM, decreased strength, hypomobility, impaired flexibility, impaired  UE functional use, and pain.   ACTIVITY LIMITATIONS: lifting, dressing, and reach over head  PARTICIPATION LIMITATIONS: cleaning  PERSONAL FACTORS: Time since onset of injury/illness/exacerbation and 1-2 comorbidities: DM type 2 and anemia  are also affecting patient's functional outcome.   REHAB POTENTIAL: Good  CLINICAL DECISION MAKING: Stable/uncomplicated  EVALUATION COMPLEXITY: Low   GOALS:   SHORT TERM GOALS: Target date: 03/08/2023    Pt will be independent and compliant with HEP for improved ROM, strength, pain, and function. Baseline: Goal status: MET 6/22  2.  Pt will demo at least a 10 deg increase in flexion, scaption, abd, ER, and IR AROM for improved stiffness and reaching.  Baseline:  Goal status: PARTIALLY MET 5/29  3.  Pt will report at least a 25% improvement in reaching activities.  Baseline:  Goal status: MET 6/22    LONG TERM GOALS: Target date: 03/29/2023  Pt will report increased usage of R UE with household cleaning and reaching into cabinets  and decreased compensatory usage of L UE.  Baseline:  Goal status: MET 6/22  2.  Pt will report she is reaching into overhead cabinets with R UE without difficulty.   Baseline:  Goal status: IN PROGRESS 6/22  3.  Pt will report she is able to dress without significant pain and difficulty.  Baseline:  Goal status: IN PROGRESS 6/22  4.  Pt will demo improved R shoulder AROM to at least 140 deg in flexion and scaption, 60 deg in ER, and 55 deg in IR for improved performance of ADLs/IADLs and reaching activities.  Baseline:  Goal status: IN PROGRESS   PLAN:  PT FREQUENCY: 2x/week  PT DURATION: 6 weeks  PLANNED INTERVENTIONS: Therapeutic exercises, Therapeutic activity, Neuromuscular re-education, Patient/Family education, Self Care, Joint mobilization, Aquatic Therapy, Dry Needling, Electrical stimulation, Spinal mobilization, Cryotherapy, Moist heat, Taping, Ultrasound, Manual therapy, and  Re-evaluation  PLAN FOR NEXT SESSION: Cont with shoulder ROM, strengthening and joint mobs.   Riki Altes, PTA  03/26/23 12:26 PM

## 2023-03-29 ENCOUNTER — Ambulatory Visit (HOSPITAL_BASED_OUTPATIENT_CLINIC_OR_DEPARTMENT_OTHER): Payer: HMO | Admitting: Physical Therapy

## 2023-03-29 DIAGNOSIS — M25611 Stiffness of right shoulder, not elsewhere classified: Secondary | ICD-10-CM | POA: Diagnosis not present

## 2023-03-29 DIAGNOSIS — M25511 Pain in right shoulder: Secondary | ICD-10-CM

## 2023-03-29 DIAGNOSIS — M6281 Muscle weakness (generalized): Secondary | ICD-10-CM

## 2023-03-29 NOTE — Therapy (Signed)
OUTPATIENT PHYSICAL THERAPY SHOULDER TREATMENT   Patient Name: Bianca Phillips MRN: [id number] DOB:Jul 02, 1956, 67 y.o., female Today's Date: 03/30/2023  END OF SESSION:   PT End of Session - 03/29/23       Visit Number 9    Number of Visits 18     Date for PT Re-Evaluation 05/03/23     Authorization Type HTA     PT Start Time 1539     PT Stop Time 1621     PT Time Calculation (min) 42 min     Activity Tolerance Patient tolerated treatment well     Behavior During Therapy WFL for tasks assessed/performed        Past Medical History:  Diagnosis Date   Complication of anesthesia    Constipation    GAD (generalized anxiety disorder)    Heart murmur    Heart palpitations    evaluated by cardiologist--- dr h. Shari Prows, note in epic 04-03-2021, normal echo and monitor showed NSR w/ SVT   History of COVID-19    per pt had covid twice in 12/ 2020 with mild symptoms with residual intermittant loss of taste;  and 04/ 2022 with mild symptoms that resolved   History of hyperthyroidism    age 62 s/p RAI   History of kidney stones    History of recurrent UTIs    History of repair of hiatal hernia 02/2020   Hyperlipidemia    Hypothyroidism, postablative    age 68  s/p RAI for hyperthyroidism;  followed by pcp   IDA (iron deficiency anemia)    MDD (major depressive disorder)    OA (osteoarthritis)    Right ureteral calculus    Seasonal allergic rhinitis    Type 2 diabetes mellitus (HCC)    followed by pcp    (07-14-2021  per pt checks blood sugar at home 2-3 times weekly,  fasting sugar --120-130)   Urgency of urination    Wears hearing aid in both ears    Past Surgical History:  Procedure Laterality Date   CESAREAN SECTION     x2  last one 1986   CYSTOSCOPY/URETEROSCOPY/HOLMIUM LASER/STENT PLACEMENT  2012   approx   CYSTOSCOPY/URETEROSCOPY/HOLMIUM LASER/STENT PLACEMENT Right 07/20/2021   Procedure: CYSTOSCOPY/RETROGRADE/URETEROSCOPY/HOLMIUM LASER/STENT PLACEMENT;   Surgeon: Jannifer Hick, MD;  Location: [facility];  Service: Urology;  Laterality: Right;  ONLY NEEDS 45 MIN   LAPAROSCOPIC NISSEN FUNDOPLICATION  02/2020   TOTAL ABDOMINAL HYSTERECTOMY W/ BILATERAL SALPINGOOPHORECTOMY  2002   TOTAL HIP ARTHROPLASTY Right 08/30/2021   Procedure: TOTAL HIP ARTHROPLASTY ANTERIOR APPROACH;  Surgeon: Kathryne Hitch, MD;  Location: [facility];  Service: Orthopedics;  Laterality: Right;   Patient Active Problem List   Diagnosis Date Noted   Pain in left shin 01/19/2023   Chronic constipation 01/19/2023   Acute non-recurrent frontal sinusitis 07/14/2022   Closed right hip fracture (HCC) 08/29/2021   Insomnia 06/24/2021   Candida lusitanea infection 03/16/2021   Chronic kidney disease (CKD) stage G3b/A2, moderately decreased glomerular filtration rate (GFR) between 30-44 mL/min/1.73 square meter and albuminuria creatinine ratio between 30-299 mg/g (HCC) 03/16/2021   Acute midline low back pain without sciatica 02/24/2021   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   Tachycardia 02/05/2021   Itching in the vaginal area 02/05/2021   Generalized anxiety disorder 02/05/2021   Depression, major, single episode, mild (HCC) 02/05/2021   Heart palpitations 02/05/2021   Adhesive capsulitis of right shoulder 02/05/2021   Urinary frequency 02/05/2021   Type 2 diabetes  mellitus with hyperglycemia, without long-term current use of insulin (HCC) 02/05/2021   Postoperative hypothyroidism 02/05/2021   Hyperlipidemia associated with type 2 diabetes mellitus (HCC) 02/05/2021   Intractable migraine without aura and without status migrainosus 02/05/2021   Seasonal allergic rhinitis due to pollen 02/05/2021   Hiatal hernia with GERD 02/27/2020   Lump of right breast 11/28/2019   Angina pectoris (HCC) 10/12/2015   Kidney stone 12/16/2014    PCP: Tollie Eth, NP  REFERRING PROVIDER: Tollie Eth, NP  REFERRING DIAG: M75.01 (ICD-10-CM) - Adhesive capsulitis  of right shoulder  THERAPY DIAG:  Stiffness of right shoulder, not elsewhere classified  Right shoulder pain, unspecified chronicity  Muscle weakness (generalized)  Rationale for Evaluation and Treatment: Rehabilitation  ONSET DATE: MD order 01/17/2023 ; chronic sx's  SUBJECTIVE:                                                                                                                                                                                      SUBJECTIVE STATEMENT: Pt states she was sore after prior Rx.  Pt states she did her exercises while on vacation, but only did 2 exercises daily.   FUNCTIONAL IMPROVEMENTS:  Pt was able to carry heavier grocery bags.  Pt able to unhook bra.  Reaching overhead. Dressing. cleaning FUNCTIONAL LIMITATIONS:  little bit of difficulty with dressing, reaching behind back, clasping bra, vacuuming   Hand dominance: Right  PERTINENT HISTORY: DM type 2 R THA in 2022 depression Anemia   PAIN:  NPRS:  0/10 current and best, 3-4/10 worst Location:  R shoulder  PRECAUTIONS: Other: R THA  WEIGHT BEARING RESTRICTIONS: No  FALLS:  Has patient fallen in last 6 months? No  LIVING ENVIRONMENT: Lives with: lives alone   OCCUPATION: Pt is retired  PLOF: Independent.  Pt has had chronic stiffness in shoulder which has affected reaching and daily activities.   PATIENT GOALS:to be able to reach normally and to do the same activities on R that she does with L  NEXT MD VISIT:   OBJECTIVE:   DIAGNOSTIC FINDINGS:  N/A  FOTO 72% on 6/22 (MET GOAL)   TODAY'S TREATMENT:  Therapeutic Exercise:  AROM/PROM:   ER:  40/47 deg IR:  58            Flexion:  130/153 deg   Scaption:  132 deg   Abduction:  107/104 deg  Shoulder strength:    Flex:  5/5  Scaption:  5/5  Abd: 4+/5  ER:  4/5  IR:   4/5  Pt received PROM right shoulder in flex, abd, ER and IR   Pt performed: Pulleys x 20 reps each in flexion and scaption Supine serratus punch 2# 2x10 Supine shoulder ABC x 1 rep with 1# Side-lying ER 2 x 10 2lb Standing wand hz add behind back x 10 reps Standing wand IR x10 Ant capsule stretch at doorway 3x10-15 sec    PATIENT EDUCATION: Education details: objective findings and progress, goal progress, HEP, POC, rationale of exercises, HEP, exercise form, and relevant anatomy.  Person educated: Patient Education method: Explanation, Demonstration, Tactile cues, Verbal cues Education comprehension:  verbalized understanding, returned demonstration, verbal cues required, tactile cues required  HOME EXERCISE PROGRAM: Access Code: [email or web address] URL: [email or web address] Date: 02/15/2023 Prepared by: Aaron Edelman   ASSESSMENT:  CLINICAL IMPRESSION: Pt has met FOTO goal. Stiffness present in shoulder with mild improvement noted following manual intervention. Mild discomfort at end range PROM all planes. Pt has met 2/3 STG and 1/4 LTG . Pt to have re-eval next visit. She would benefit from continued ROM interventions to improve function.  FUNCTIONAL IMPROVEMENTS:  Pt was able to carry heavier grocery bags.  Pt able to unhook bra.  Reaching overhead. Dressing. cleaning FUNCTIONAL LIMITATIONS:  little bit of difficulty with dressing, reaching behind back, clasping bra, vacuuming    OBJECTIVE IMPAIRMENTS: decreased ROM, decreased strength, hypomobility, impaired flexibility, impaired UE functional use, and pain.   ACTIVITY LIMITATIONS: lifting, dressing, and reach over head  PARTICIPATION LIMITATIONS: cleaning  PERSONAL FACTORS: Time since onset of injury/illness/exacerbation and 1-2 comorbidities: DM type 2 and anemia  are also affecting patient's functional outcome.   REHAB POTENTIAL: Good  CLINICAL DECISION MAKING: Stable/uncomplicated  EVALUATION  COMPLEXITY: Low   GOALS:   SHORT TERM GOALS: Target date: 03/08/2023    Pt will be independent and compliant with HEP for improved ROM, strength, pain, and function. Baseline: Goal status: MET 6/22  2.  Pt will demo at least a 10 deg increase in flexion, scaption, abd, ER, and IR AROM for improved stiffness and reaching.  Baseline:  Goal status: 60% MET 6/24  3.  Pt will report at least a 25% improvement in reaching activities.  Baseline:  Goal status: MET 6/22    LONG TERM GOALS: Target date: 03/29/2023  Pt will report increased usage of R UE with household cleaning and reaching into cabinets and decreased compensatory usage of L UE.  Baseline:  Goal status: MET 6/22  2.  Pt will report she is reaching into overhead cabinets with R UE without difficulty.   Baseline:  Goal status: PROGRESSING 6/24  3.  Pt will report she is able to dress without significant pain and difficulty.  Baseline:  Goal status: IN PROGRESS 6/24  4.  Pt will demo improved R shoulder AROM to at least 140 deg in flexion and scaption, 60 deg in ER, and 55 deg in IR for improved performance of ADLs/IADLs and reaching activities.  Baseline:  Goal status: IN PROGRESS 6/24   PLAN:  PT FREQUENCY: 2x/week  PT DURATION: 4-5 weeks  PLANNED INTERVENTIONS: Therapeutic exercises, Therapeutic activity, Neuromuscular re-education, Patient/Family education, Self  Care, Joint mobilization, Aquatic Therapy, Dry Needling, Electrical stimulation, Spinal mobilization, Cryotherapy, Moist heat, Taping, Ultrasound, Manual therapy, and Re-evaluation  PLAN FOR NEXT SESSION: Cont with shoulder ROM, strengthening and joint mobs.   Riki Altes, PTA  03/30/23 11:50 PM

## 2023-03-30 ENCOUNTER — Encounter (HOSPITAL_BASED_OUTPATIENT_CLINIC_OR_DEPARTMENT_OTHER): Payer: Self-pay | Admitting: Physical Therapy

## 2023-03-31 ENCOUNTER — Encounter (HOSPITAL_BASED_OUTPATIENT_CLINIC_OR_DEPARTMENT_OTHER): Payer: HMO | Admitting: Physical Therapy

## 2023-04-01 ENCOUNTER — Ambulatory Visit (AMBULATORY_SURGERY_CENTER): Payer: HMO | Admitting: Gastroenterology

## 2023-04-01 ENCOUNTER — Other Ambulatory Visit: Payer: Self-pay | Admitting: Gastroenterology

## 2023-04-01 ENCOUNTER — Encounter: Payer: Self-pay | Admitting: Gastroenterology

## 2023-04-01 VITALS — BP 148/73 | HR 97 | Temp 97.8°F | Resp 15 | Ht 65.0 in | Wt 147.6 lb

## 2023-04-01 DIAGNOSIS — K625 Hemorrhage of anus and rectum: Secondary | ICD-10-CM | POA: Diagnosis not present

## 2023-04-01 DIAGNOSIS — K5904 Chronic idiopathic constipation: Secondary | ICD-10-CM | POA: Diagnosis not present

## 2023-04-01 DIAGNOSIS — K2210 Ulcer of esophagus without bleeding: Secondary | ICD-10-CM | POA: Diagnosis not present

## 2023-04-01 DIAGNOSIS — K229 Disease of esophagus, unspecified: Secondary | ICD-10-CM | POA: Diagnosis not present

## 2023-04-01 DIAGNOSIS — Z86010 Personal history of colonic polyps: Secondary | ICD-10-CM

## 2023-04-01 DIAGNOSIS — D123 Benign neoplasm of transverse colon: Secondary | ICD-10-CM

## 2023-04-01 DIAGNOSIS — K219 Gastro-esophageal reflux disease without esophagitis: Secondary | ICD-10-CM | POA: Diagnosis not present

## 2023-04-01 DIAGNOSIS — Z09 Encounter for follow-up examination after completed treatment for conditions other than malignant neoplasm: Secondary | ICD-10-CM

## 2023-04-01 DIAGNOSIS — F329 Major depressive disorder, single episode, unspecified: Secondary | ICD-10-CM | POA: Diagnosis not present

## 2023-04-01 DIAGNOSIS — K259 Gastric ulcer, unspecified as acute or chronic, without hemorrhage or perforation: Secondary | ICD-10-CM

## 2023-04-01 DIAGNOSIS — K623 Rectal prolapse: Secondary | ICD-10-CM | POA: Diagnosis not present

## 2023-04-01 DIAGNOSIS — K626 Ulcer of anus and rectum: Secondary | ICD-10-CM

## 2023-04-01 DIAGNOSIS — F411 Generalized anxiety disorder: Secondary | ICD-10-CM | POA: Diagnosis not present

## 2023-04-01 DIAGNOSIS — K2100 Gastro-esophageal reflux disease with esophagitis, without bleeding: Secondary | ICD-10-CM

## 2023-04-01 DIAGNOSIS — K51211 Ulcerative (chronic) proctitis with rectal bleeding: Secondary | ICD-10-CM | POA: Diagnosis not present

## 2023-04-01 MED ORDER — SODIUM CHLORIDE 0.9 % IV SOLN
500.0000 mL | INTRAVENOUS | Status: AC
Start: 2023-04-01 — End: ?

## 2023-04-01 MED ORDER — PANTOPRAZOLE SODIUM 40 MG PO TBEC
40.0000 mg | DELAYED_RELEASE_TABLET | Freq: Two times a day (BID) | ORAL | 3 refills | Status: DC
Start: 2023-04-01 — End: 2024-05-22

## 2023-04-01 NOTE — Progress Notes (Signed)
Pt's states no medical or surgical changes since previsit or office visit. 

## 2023-04-01 NOTE — Progress Notes (Signed)
Called to room to assist during endoscopic procedure.  Patient ID and intended procedure confirmed with present staff. Received instructions for my participation in the procedure from the performing physician.  
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
## 2023-04-01 NOTE — Progress Notes (Signed)
Vss nad trans to pacu 

## 2023-04-01 NOTE — Op Note (Signed)
[facility] Patient Name: Addilynn Cramer Procedure Date: 04/01/2023 3:24 PM MRN: [id number] Endoscopist: Lynann Bologna , MD, [id number] Age: 67 Referring MD:  Date of Birth: [birth year] Gender: Female Account #: [id number] Procedure:                Upper GI endoscopy Indications:              Epigastric abdominal pain. GERD. Medicines:                Monitored Anesthesia Care Procedure:                Pre-Anesthesia Assessment:                           - Prior to the procedure, a History and Physical                            was performed, and patient medications and                            allergies were reviewed. The patient's tolerance of                            previous anesthesia was also reviewed. The risks                            and benefits of the procedure and the sedation                            options and risks were discussed with the patient.                            All questions were answered, and informed consent                            was obtained. Prior Anticoagulants: The patient has                            taken no anticoagulant or antiplatelet agents. ASA                            Grade Assessment: II - A patient with mild systemic                            disease. After reviewing the risks and benefits,                            the patient was deemed in satisfactory condition to                            undergo the procedure.                           After obtaining informed consent, the endoscope was  passed under direct vision. Throughout the                            procedure, the patient's blood pressure, pulse, and                            oxygen saturations were monitored continuously. The                            GIF [id number] #[id number] was introduced through the                            mouth, and advanced to the second part of duodenum.                            The upper GI endoscopy  was accomplished without                            difficulty. The patient tolerated the procedure                            well. Scope In: Scope Out: Findings:                 The esophagus was mildly torturous. LA Grade C (one                            or more mucosal breaks continuous between tops of 2                            or more mucosal folds, less than 75% circumference)                            esophagitis with no bleeding was found 33 to 35 cm                            from the incisors. Biopsies were taken with a cold                            forceps for histology.                           A 3 cm hiatal hernia was present. Evidence of                            previous Nissen's fundoplication on retroflexed                            examination of the cardia.                           One non-bleeding superficial gastric ulcer with no  stigmata of bleeding was found in the gastric body                            with surrounding gastritis. The lesion was 8 mm in                            largest dimension. Biopsies were taken with a cold                            forceps for histology.                           The examined duodenum was normal. Biopsies for                            histology were taken with a cold forceps for                            evaluation of celiac disease. Complications:            No immediate complications. Did have transient                            laryngospasm which resolved promptly. Estimated Blood Loss:     Estimated blood loss: none. Impression:               - LA Grade C reflux esophagitis with no bleeding.                            Biopsied.                           - 3 cm hiatal hernia. Slipped Nissen's                            fundoplication.                           - Non-bleeding gastric ulcer with no stigmata of                            bleeding. Biopsied.                            - Normal examined duodenum. Biopsied. Recommendation:           - Patient has a contact number available for                            emergencies. The signs and symptoms of potential                            delayed complications were discussed with the                            patient. Return to normal activities tomorrow.  Written discharge instructions were provided to the                            patient.                           - Resume previous diet.                           - Continue present medications.                           - No aspirin, ibuprofen, naproxen, or other                            non-steroidal anti-inflammatory drugs.                           - Use Protonix (pantoprazole) 40 mg PO BID x 8                            weeks, then once a day.                           - Recommend repeating EGD with distal esophageal                            biopsies (to rule out Barrett's) and ensure healing                            of the gastric ulcer in appox 12 weeks                           - The findings and recommendations were discussed                            with the patient's family. Lynann Bologna, MD 04/01/2023 4:27:19 PM This report has been signed electronically.

## 2023-04-01 NOTE — Patient Instructions (Addendum)
NO aspirin, ibuprofen, naproxen, or other non-steroidal anti-inflammatory drugs.  Use Protonix 40 mg twice daily for 8 weeks then once daily.  Use benefiber one teaspoon daily with 8 ounces of water. Miralax one capful (17grams) in 8 ounces of water.       YOU HAD AN ENDOSCOPIC PROCEDURE TODAY AT THE [facility]:   Refer to the procedure report that was given to you for any specific questions about what was found during the examination.  If the procedure report does not answer your questions, please call your gastroenterologist to clarify.  If you requested that your care partner not be given the details of your procedure findings, then the procedure report has been included in a sealed envelope for you to review at your convenience later.  YOU SHOULD EXPECT: Some feelings of bloating in the abdomen. Passage of more gas than usual.  Walking can help get rid of the air that was put into your GI tract during the procedure and reduce the bloating. If you had a lower endoscopy (such as a colonoscopy or flexible sigmoidoscopy) you may notice spotting of blood in your stool or on the toilet paper. If you underwent a bowel prep for your procedure, you may not have a normal bowel movement for a few days.  Please Note:  You might notice some irritation and congestion in your nose or some drainage.  This is from the oxygen used during your procedure.  There is no need for concern and it should clear up in a day or so.  SYMPTOMS TO REPORT IMMEDIATELY:  Following lower endoscopy (colonoscopy or flexible sigmoidoscopy):  Excessive amounts of blood in the stool  Significant tenderness or worsening of abdominal pains  Swelling of the abdomen that is new, acute  Fever of 100F or higher  Following upper endoscopy (EGD)  Vomiting of blood or coffee ground material  New chest pain or pain under the shoulder blades  Painful or persistently difficult swallowing  New shortness of breath  Fever of  100F or higher  Black, tarry-looking stools  For urgent or emergent issues, a gastroenterologist can be reached at any hour by calling [phone]. Do not use MyChart messaging for urgent concerns.    DIET:  We do recommend a small meal at first, but then you may proceed to your regular diet.  Drink plenty of fluids but you should avoid alcoholic beverages for 24 hours.  ACTIVITY:  You should plan to take it easy for the rest of today and you should NOT DRIVE or use heavy machinery until tomorrow (because of the sedation medicines used during the test).    FOLLOW UP: Our staff will call the number listed on your records the next business day following your procedure.  We will call around 7:15- 8:00 am to check on you and address any questions or concerns that you may have regarding the information given to you following your procedure. If we do not reach you, we will leave a message.     If any biopsies were taken you will be contacted by phone or by letter within the next 1-3 weeks.  Please call us at [phone] if you have not heard about the biopsies in 3 weeks.    SIGNATURES/CONFIDENTIALITY: You and/or your care partner have signed paperwork which will be entered into your electronic medical record.  These signatures attest to the fact that that the information above on your After Visit Summary has been reviewed and is  understood.  Full responsibility of the confidentiality of this discharge information lies with you and/or your care-partner. 

## 2023-04-01 NOTE — Op Note (Signed)
[facility] Patient Name: Bianca Phillips Procedure Date: 04/01/2023 3:23 PM MRN: [id number] Endoscopist: Lynann Bologna , MD, [id number] Age: 67 Referring MD:  Date of Birth: 1956-03-14 Gender: Female Account #: [id number] Procedure:                Colonoscopy Indications:              Rectal bleeding. H/O polyps s/p colonoscopy 4-5                            years ago in [place]. Records not available. Medicines:                Monitored Anesthesia Care Procedure:                Pre-Anesthesia Assessment:                           - Prior to the procedure, a History and Physical                            was performed, and patient medications and                            allergies were reviewed. The patient's tolerance of                            previous anesthesia was also reviewed. The risks                            and benefits of the procedure and the sedation                            options and risks were discussed with the patient.                            All questions were answered, and informed consent                            was obtained. Prior Anticoagulants: The patient has                            taken no anticoagulant or antiplatelet agents. ASA                            Grade Assessment: III - A patient with severe                            systemic disease. After reviewing the risks and                            benefits, the patient was deemed in satisfactory                            condition to undergo the procedure.  After obtaining informed consent, the colonoscope                            was passed under direct vision. Throughout the                            procedure, the patient's blood pressure, pulse, and                            oxygen saturations were monitored continuously. The                            Olympus Scope [id number] was introduced through the                            anus  and advanced to the the cecum, identified by                            appendiceal orifice and ileocecal valve. The                            colonoscopy was performed with moderate difficulty                            due to a tortuous colon. Successful completion of                            the procedure was aided by changing the patient to                            a supine position and applying abdominal pressure.                            The patient tolerated the procedure well. The                            quality of the bowel preparation was adequate to                            identify polyps greater than 5 mm in size. The                            ileocecal valve, appendiceal orifice, and rectum                            were photographed.                           The exam was difficult d/t highly redundant colon                            and retained solid vegetable material in some areas  of the colon clogging the suction channel of the                            scope. Scope In: 3:47:49 PM Scope Out: 4:20:00 PM Scope Withdrawal Time: 0 hours 16 minutes 50 seconds  Total Procedure Duration: 0 hours 32 minutes 11 seconds  Findings:                 A 6 mm polyp was found in the mid transverse colon.                            The polyp was sessile. The polyp was removed with a                            cold snare. Resection and retrieval were complete.                           A single (solitary) ten mm ulcer was found in the                            mid rectum. No bleeding was present. No stigmata of                            recent bleeding were seen. She also has associated                            rectal prolapse. The retroflexed examination of the                            rectum could not be performed due to limited                            mobility. Biopsies were taken with a cold forceps                            for  histology.                           Multiple medium-mouthed diverticula were found in                            the sigmoid colon, transverse colon and ascending                            colon. Complications:            No immediate complications. Estimated Blood Loss:     Estimated blood loss: none. Impression:               - One 6 mm polyp in the mid transverse colon,                            removed with a cold snare. Resected and retrieved.                           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 Biopsied. R/O                            other causes.                           - Moderate pancolonic diverticulosis predominantly                            in the right colon. Recommendation:           - Patient has a contact number available for                            emergencies. The signs and symptoms of potential                            delayed complications were discussed with the                            patient. Return to normal activities tomorrow.                            Written discharge instructions were provided to the                            patient.                           - Resume previous diet.                           - Use Benefiber one teaspoon PO daily with 8 ounces                            of water.                           - Await pathology results.                           - Repeat colonoscopy in 5 years for surveillance                            based on pathology results, with 2-day prep.                            Earlier, if with any new problems                           - Return  to GI clinic in 6-8 weeks.                           - The findings and recommendations were discussed                            with the patient's family. Lynann Bologna, MD 04/01/2023 4:39:14 PM This  report has been signed electronically.

## 2023-04-02 ENCOUNTER — Encounter (HOSPITAL_BASED_OUTPATIENT_CLINIC_OR_DEPARTMENT_OTHER): Payer: HMO

## 2023-04-04 ENCOUNTER — Telehealth: Payer: Self-pay

## 2023-04-04 NOTE — Telephone Encounter (Signed)
  Follow up Call-     04/01/2023    2:51 PM  Call back number  Post procedure Call Back phone  # [phone]  Permission to leave phone message Yes     Patient questions:  Do you have a fever, pain , or abdominal swelling? No. Pain Score  0 *  Have you tolerated food without any problems? Yes.    Have you been able to return to your normal activities? Yes.    Do you have any questions about your discharge instructions: Diet   No. Medications  No. Follow up visit  No.  Do you have questions or concerns about your Care? No.  Actions: * If pain score is 4 or above: No action needed, pain <4.

## 2023-04-10 ENCOUNTER — Encounter: Payer: Self-pay | Admitting: Gastroenterology

## 2023-04-13 ENCOUNTER — Ambulatory Visit (HOSPITAL_BASED_OUTPATIENT_CLINIC_OR_DEPARTMENT_OTHER): Payer: HMO | Attending: Nurse Practitioner

## 2023-04-13 ENCOUNTER — Encounter (HOSPITAL_BASED_OUTPATIENT_CLINIC_OR_DEPARTMENT_OTHER): Payer: Self-pay

## 2023-04-13 DIAGNOSIS — M25511 Pain in right shoulder: Secondary | ICD-10-CM | POA: Diagnosis not present

## 2023-04-13 DIAGNOSIS — M25611 Stiffness of right shoulder, not elsewhere classified: Secondary | ICD-10-CM | POA: Diagnosis not present

## 2023-04-13 DIAGNOSIS — M6281 Muscle weakness (generalized): Secondary | ICD-10-CM | POA: Insufficient documentation

## 2023-04-13 NOTE — Therapy (Signed)
OUTPATIENT PHYSICAL THERAPY SHOULDER TREATMENT   Patient Name: Bianca Phillips MRN: [id number] DOB:07/10/1956, 67 y.o., female Today's Date: 04/13/2023  END OF SESSION:  PT End of Session - 04/13/23 1044     Visit Number 10    Number of Visits 18    Date for PT Re-Evaluation 05/03/23    Authorization Type HTA    PT Start Time 1020    PT Stop Time 1050    PT Time Calculation (min) 30 min    Activity Tolerance Patient tolerated treatment well    Behavior During Therapy [facility] for tasks assessed/performed             PT End of Session - 03/29/23       Visit Number 9    Number of Visits 18     Date for PT Re-Evaluation 05/03/23     Authorization Type HTA     PT Start Time 1539     PT Stop Time 1621     PT Time Calculation (min) 42 min     Activity Tolerance Patient tolerated treatment well     Behavior During Therapy [facility] for tasks assessed/performed        Past Medical History:  Diagnosis Date   Complication of anesthesia    Constipation    GAD (generalized anxiety disorder)    Heart murmur    Heart palpitations    evaluated by cardiologist--- dr h. Shari Prows, note in epic 04-03-2021, normal echo and monitor showed NSR w/ SVT   History of COVID-19    per pt had covid twice in 12/ 2020 with mild symptoms with residual intermittant loss of taste;  and 04/ 2022 with mild symptoms that resolved   History of hyperthyroidism    age 67 s/p RAI   History of kidney stones    History of recurrent UTIs    History of repair of hiatal hernia 02/2020   Hyperlipidemia    Hypothyroidism, postablative    age 67  s/p RAI for hyperthyroidism;  followed by pcp   IDA (iron deficiency anemia)    MDD (major depressive disorder)    OA (osteoarthritis)    Right ureteral calculus    Seasonal allergic rhinitis    Type 2 diabetes mellitus (HCC)    followed by pcp    (07-14-2021  per pt checks blood sugar at home 2-3 times weekly,  fasting sugar --120-130)   Urgency of urination     Wears hearing aid in both ears    Past Surgical History:  Procedure Laterality Date   CESAREAN SECTION     x2  last one 1986   CYSTOSCOPY/URETEROSCOPY/HOLMIUM LASER/STENT PLACEMENT  2012   approx   CYSTOSCOPY/URETEROSCOPY/HOLMIUM LASER/STENT PLACEMENT Right 07/20/2021   Procedure: CYSTOSCOPY/RETROGRADE/URETEROSCOPY/HOLMIUM LASER/STENT PLACEMENT;  Surgeon: Jannifer Hick, MD;  Location: [facility];  Service: Urology;  Laterality: Right;  ONLY NEEDS 45 MIN   LAPAROSCOPIC NISSEN FUNDOPLICATION  02/2020   TOTAL ABDOMINAL HYSTERECTOMY W/ BILATERAL SALPINGOOPHORECTOMY  2002   TOTAL HIP ARTHROPLASTY Right 08/30/2021   Procedure: TOTAL HIP ARTHROPLASTY ANTERIOR APPROACH;  Surgeon: Kathryne Hitch, MD;  Location: [facility];  Service: Orthopedics;  Laterality: Right;   Patient Active Problem List   Diagnosis Date Noted   Pain in left shin 01/19/2023   Chronic constipation 01/19/2023   Acute non-recurrent frontal sinusitis 07/14/2022   Closed right hip fracture (HCC) 08/29/2021   Insomnia 06/24/2021   Candida lusitanea infection 03/16/2021   Chronic kidney disease (CKD)  stage G3b/A2, moderately decreased glomerular filtration rate (GFR) between 30-44 mL/min/1.73 square meter and albuminuria creatinine ratio between 30-299 mg/g (HCC) 03/16/2021   Acute midline low back pain without sciatica 02/24/2021   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   Tachycardia 02/05/2021   Itching in the vaginal area 02/05/2021   Generalized anxiety disorder 02/05/2021   Depression, major, single episode, mild (HCC) 02/05/2021   Heart palpitations 02/05/2021   Adhesive capsulitis of right shoulder 02/05/2021   Urinary frequency 02/05/2021   Type 2 diabetes mellitus with hyperglycemia, without long-term current use of insulin (HCC) 02/05/2021   Postoperative hypothyroidism 02/05/2021   Hyperlipidemia associated with type 2 diabetes mellitus (HCC) 02/05/2021   Intractable migraine without aura and  without status migrainosus 02/05/2021   Seasonal allergic rhinitis due to pollen 02/05/2021   Hiatal hernia with GERD 02/27/2020   Lump of right breast 11/28/2019   Angina pectoris (HCC) 10/12/2015   Kidney stone 12/16/2014    PCP: Tollie Eth, NP  REFERRING PROVIDER: Tollie Eth, NP  REFERRING DIAG: M75.01 (ICD-10-CM) - Adhesive capsulitis of right shoulder  THERAPY DIAG:  Stiffness of right shoulder, not elsewhere classified  Right shoulder pain, unspecified chronicity  Muscle weakness (generalized)  Rationale for Evaluation and Treatment: Rehabilitation  ONSET DATE: MD order 01/17/2023 ; chronic sx's  SUBJECTIVE:                                                                                                                                                                                      SUBJECTIVE STATEMENT: Pt states she was sore after prior Rx.  Pt states she did her exercises while on vacation, but only did 2 exercises daily.   FUNCTIONAL IMPROVEMENTS:  Pt was able to carry heavier grocery bags.  Pt able to unhook bra.  Reaching overhead. Dressing. cleaning FUNCTIONAL LIMITATIONS:  little bit of difficulty with dressing, reaching behind back, clasping bra, vacuuming   Hand dominance: Right  PERTINENT HISTORY: DM type 2 R THA in 2022 depression Anemia   PAIN:  NPRS:  0/10 current and best, 3-4/10 worst Location:  R shoulder  PRECAUTIONS: Other: R THA  WEIGHT BEARING RESTRICTIONS: No  FALLS:  Has patient fallen in last 6 months? No  LIVING ENVIRONMENT: Lives with: lives alone   OCCUPATION: Pt is retired  PLOF: Independent.  Pt has had chronic stiffness in shoulder which has affected reaching and daily activities.   PATIENT GOALS:to be able to reach normally and to do the same activities on R that she does with L  NEXT MD VISIT:   OBJECTIVE:   6/25  AROM/PROM:   ER:  40/47 deg IR:  58  Flexion:  130/153 deg   Scaption:  132  deg   Abduction:  107/104 deg  Shoulder strength:    Flex:  5/5  Scaption:  5/5  Abd: 4+/5  ER:  4/5  IR:  4/5  DIAGNOSTIC FINDINGS:  N/A  FOTO 72% on 6/22 (MET GOAL)   TODAY'S TREATMENT:                                                                                                                                          Therapeutic Exercise:    Pt received PROM right shoulder in flex, abd, ER and IR   Pt performed: Supine serratus punch 2# 2x10 Supine shoulder ABC x 1 rep with 2# Supine flexion 2x10 3# TB IR and ER 2x10ea RTB Standing flexion/abduction 2.5lb cuff weight 2x10ea Standing wand IR x10, 5 sec hold  Seated row machine 25lbs x10 (#12)   PATIENT EDUCATION: Education details: objective findings and progress, goal progress, HEP, POC, rationale of exercises, HEP, exercise form, and relevant anatomy.  Person educated: Patient Education method: Explanation, Demonstration, Tactile cues, Verbal cues Education comprehension:  verbalized understanding, returned demonstration, verbal cues required, tactile cues required  HOME EXERCISE PROGRAM: Access Code: [email or web address] URL: [email or web address] Date: 02/15/2023 Prepared by: Aaron Edelman   ASSESSMENT:  CLINICAL IMPRESSION: Pt requested to leave session early to make it to aquatic class. She remains slightly stiff into end ranges each plane during shoulder PROM. Improving strength as noted by progressions with therex. Mild scapular winging noted with IR behind back. Will continue to progress towards goals.     OBJECTIVE IMPAIRMENTS: decreased ROM, decreased strength, hypomobility, impaired flexibility, impaired UE functional use, and pain.   ACTIVITY LIMITATIONS: lifting, dressing, and reach over head  PARTICIPATION LIMITATIONS: cleaning  PERSONAL FACTORS: Time since onset of injury/illness/exacerbation and 1-2 comorbidities: DM type 2 and anemia  are also affecting patient's functional outcome.    REHAB POTENTIAL: Good  CLINICAL DECISION MAKING: Stable/uncomplicated  EVALUATION COMPLEXITY: Low   GOALS:   SHORT TERM GOALS: Target date: 03/08/2023    Pt will be independent and compliant with HEP for improved ROM, strength, pain, and function. Baseline: Goal status: MET 6/22  2.  Pt will demo at least a 10 deg increase in flexion, scaption, abd, ER, and IR AROM for improved stiffness and reaching.  Baseline:  Goal status: 60% MET 6/25  3.  Pt will report at least a 25% improvement in reaching activities.  Baseline:  Goal status: MET 6/22    LONG TERM GOALS: Target date: 05/03/2023  Pt will report increased usage of R UE with household cleaning and reaching into cabinets and decreased compensatory usage of L UE.  Baseline:  Goal status: MET 6/22  2.  Pt will report she is reaching into overhead cabinets with R UE without difficulty.   Baseline:  Goal status: PROGRESSING 6/25  3.  Pt will  report she is able to dress without significant pain and difficulty.  Baseline:  Goal status: IN PROGRESS 6/25  4.  Pt will demo improved R shoulder AROM to at least 140 deg in flexion and scaption, 60 deg in ER, and 55 deg in IR for improved performance of ADLs/IADLs and reaching activities.  Baseline:  Goal status: IN PROGRESS 6/25   PLAN:  PT FREQUENCY: 2x/week  PT DURATION: 4-5 weeks  PLANNED INTERVENTIONS: Therapeutic exercises, Therapeutic activity, Neuromuscular re-education, Patient/Family education, Self Care, Joint mobilization, Aquatic Therapy, Dry Needling, Electrical stimulation, Spinal mobilization, Cryotherapy, Moist heat, Taping, Ultrasound, Manual therapy, and Re-evaluation  PLAN FOR NEXT SESSION: Cont with shoulder ROM, strengthening and joint mobs.  Riki Altes, PTA  04/13/23 10:54 AM

## 2023-04-16 ENCOUNTER — Encounter (HOSPITAL_BASED_OUTPATIENT_CLINIC_OR_DEPARTMENT_OTHER): Payer: Self-pay

## 2023-04-16 ENCOUNTER — Ambulatory Visit (HOSPITAL_BASED_OUTPATIENT_CLINIC_OR_DEPARTMENT_OTHER): Payer: HMO

## 2023-04-16 DIAGNOSIS — M25511 Pain in right shoulder: Secondary | ICD-10-CM

## 2023-04-16 DIAGNOSIS — M25611 Stiffness of right shoulder, not elsewhere classified: Secondary | ICD-10-CM | POA: Diagnosis not present

## 2023-04-16 DIAGNOSIS — M6281 Muscle weakness (generalized): Secondary | ICD-10-CM

## 2023-04-16 NOTE — Therapy (Signed)
OUTPATIENT PHYSICAL THERAPY SHOULDER TREATMENT   Patient Name: Bianca Phillips MRN: [id number] DOB:05/27/1956, 67 y.o., female Today's Date: 04/16/2023  END OF SESSION:  PT End of Session - 04/16/23 1148     Visit Number 11    Number of Visits 18    Date for PT Re-Evaluation 05/03/23    Authorization Type HTA    PT Start Time 1133    PT Stop Time 1212    PT Time Calculation (min) 39 min    Activity Tolerance Patient tolerated treatment well    Behavior During Therapy [facility] for tasks assessed/performed              PT End of Session - 03/29/23       Visit Number 9    Number of Visits 18     Date for PT Re-Evaluation 05/03/23     Authorization Type HTA     PT Start Time 1539     PT Stop Time 1621     PT Time Calculation (min) 42 min     Activity Tolerance Patient tolerated treatment well     Behavior During Therapy [facility] for tasks assessed/performed        Past Medical History:  Diagnosis Date   Complication of anesthesia    Constipation    GAD (generalized anxiety disorder)    Heart murmur    Heart palpitations    evaluated by cardiologist--- dr h. Shari Prows, note in epic 04-03-2021, normal echo and monitor showed NSR w/ SVT   History of COVID-19    per pt had covid twice in 12/ 2020 with mild symptoms with residual intermittant loss of taste;  and 04/ 2022 with mild symptoms that resolved   History of hyperthyroidism    age 19 s/p RAI   History of kidney stones    History of recurrent UTIs    History of repair of hiatal hernia 02/2020   Hyperlipidemia    Hypothyroidism, postablative    age 6  s/p RAI for hyperthyroidism;  followed by pcp   IDA (iron deficiency anemia)    MDD (major depressive disorder)    OA (osteoarthritis)    Right ureteral calculus    Seasonal allergic rhinitis    Type 2 diabetes mellitus (HCC)    followed by pcp    (07-14-2021  per pt checks blood sugar at home 2-3 times weekly,  fasting sugar --120-130)   Urgency of urination     Wears hearing aid in both ears    Past Surgical History:  Procedure Laterality Date   CESAREAN SECTION     x2  last one 1986   CYSTOSCOPY/URETEROSCOPY/HOLMIUM LASER/STENT PLACEMENT  2012   approx   CYSTOSCOPY/URETEROSCOPY/HOLMIUM LASER/STENT PLACEMENT Right 07/20/2021   Procedure: CYSTOSCOPY/RETROGRADE/URETEROSCOPY/HOLMIUM LASER/STENT PLACEMENT;  Surgeon: Jannifer Hick, MD;  Location: [facility];  Service: Urology;  Laterality: Right;  ONLY NEEDS 45 MIN   LAPAROSCOPIC NISSEN FUNDOPLICATION  02/2020   TOTAL ABDOMINAL HYSTERECTOMY W/ BILATERAL SALPINGOOPHORECTOMY  2002   TOTAL HIP ARTHROPLASTY Right 08/30/2021   Procedure: TOTAL HIP ARTHROPLASTY ANTERIOR APPROACH;  Surgeon: Kathryne Hitch, MD;  Location: [facility] OR;  Service: Orthopedics;  Laterality: Right;   Patient Active Problem List   Diagnosis Date Noted   Pain in left shin 01/19/2023   Chronic constipation 01/19/2023   Acute non-recurrent frontal sinusitis 07/14/2022   Closed right hip fracture (HCC) 08/29/2021   Insomnia 06/24/2021   Candida lusitanea infection 03/16/2021   Chronic kidney disease (  CKD) stage G3b/A2, moderately decreased glomerular filtration rate (GFR) between 30-44 mL/min/1.73 square meter and albuminuria creatinine ratio between 30-299 mg/g (HCC) 03/16/2021   Acute midline low back pain without sciatica 02/24/2021   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   Tachycardia 02/05/2021   Itching in the vaginal area 02/05/2021   Generalized anxiety disorder 02/05/2021   Depression, major, single episode, mild (HCC) 02/05/2021   Heart palpitations 02/05/2021   Adhesive capsulitis of right shoulder 02/05/2021   Urinary frequency 02/05/2021   Type 2 diabetes mellitus with hyperglycemia, without long-term current use of insulin (HCC) 02/05/2021   Postoperative hypothyroidism 02/05/2021   Hyperlipidemia associated with type 2 diabetes mellitus (HCC) 02/05/2021   Intractable migraine without aura and  without status migrainosus 02/05/2021   Seasonal allergic rhinitis due to pollen 02/05/2021   Hiatal hernia with GERD 02/27/2020   Lump of right breast 11/28/2019   Angina pectoris (HCC) 10/12/2015   Kidney stone 12/16/2014    PCP: Tollie Eth, NP  REFERRING PROVIDER: Tollie Eth, NP  REFERRING DIAG: M75.01 (ICD-10-CM) - Adhesive capsulitis of right shoulder  THERAPY DIAG:  Stiffness of right shoulder, not elsewhere classified  Right shoulder pain, unspecified chronicity  Muscle weakness (generalized)  Rationale for Evaluation and Treatment: Rehabilitation  ONSET DATE: MD order 01/17/2023 ; chronic sx's  SUBJECTIVE:                                                                                                                                                                                      SUBJECTIVE STATEMENT: Pt reports she was able to fasten her bra behind her back this morning. She denies pain. States she has not been compliant with HEP as much as she should, but has been trying to work her shoulder in the pool.   Hand dominance: Right  PERTINENT HISTORY: DM type 2 R THA in 2022 depression Anemia   PAIN:  NPRS:  0/10 current and best, 3-4/10 worst Location:  R shoulder  PRECAUTIONS: Other: R THA  WEIGHT BEARING RESTRICTIONS: No  FALLS:  Has patient fallen in last 6 months? No  LIVING ENVIRONMENT: Lives with: lives alone   OCCUPATION: Pt is retired  PLOF: Independent.  Pt has had chronic stiffness in shoulder which has affected reaching and daily activities.   PATIENT GOALS:to be able to reach normally and to do the same activities on R that she does with L  NEXT MD VISIT:   OBJECTIVE:   6/25  AROM/PROM:   ER:  40/47 deg IR:  58            Flexion:  130/153 deg   Scaption:  132 deg  Abduction:  107/104 deg  Shoulder strength:    Flex:  5/5  Scaption:  5/5  Abd: 4+/5  ER:  4/5  IR:  4/5  DIAGNOSTIC FINDINGS:  N/A  FOTO 72%  on 6/22 (MET GOAL)   TODAY'S TREATMENT:                                                                                                                                          Therapeutic Exercise:    Pt received PROM right shoulder in flex, abd, ER and IR   Pt performed: Supine serratus punch 2# 2x10 Supine shoulder ABC x 1 rep with 2# Supine flexion 2x10 3# TB IR and ER 2x10ea RTB Standing flexion/abduction 3# x10ea Standing wand IR x10, 5 sec hold Overhead ball 4way 3#- x20ea Overhead ball lift off wall 2.2#- 2x10 Bilateral ER YTB 2x10 Wall push ups (bothered hand) Bent over row 6# 2x10 (significant cuing required for form)  PATIENT EDUCATION: Education details: objective findings and progress, goal progress, HEP, POC, rationale of exercises, HEP, exercise form, and relevant anatomy.  Person educated: Patient Education method: Explanation, Demonstration, Tactile cues, Verbal cues Education comprehension:  verbalized understanding, returned demonstration, verbal cues required, tactile cues required  HOME EXERCISE PROGRAM: Access Code: [email or web address] URL: [email or web address] Date: 02/15/2023 Prepared by: Aaron Edelman   ASSESSMENT:  CLINICAL IMPRESSION: Pt improving in end range PROM, though mild deficits still observed. Good tolerance for active strengthening of R shoulder. She denied pain with AROM activities. Unable to complete wall push ups due to R hand pain. Will continue to progress as tolerated while monitoring pain level.     OBJECTIVE IMPAIRMENTS: decreased ROM, decreased strength, hypomobility, impaired flexibility, impaired UE functional use, and pain.   ACTIVITY LIMITATIONS: lifting, dressing, and reach over head  PARTICIPATION LIMITATIONS: cleaning  PERSONAL FACTORS: Time since onset of injury/illness/exacerbation and 1-2 comorbidities: DM type 2 and anemia  are also affecting patient's functional outcome.   REHAB POTENTIAL:  Good  CLINICAL DECISION MAKING: Stable/uncomplicated  EVALUATION COMPLEXITY: Low   GOALS:   SHORT TERM GOALS: Target date: 03/08/2023    Pt will be independent and compliant with HEP for improved ROM, strength, pain, and function. Baseline: Goal status: MET 6/22  2.  Pt will demo at least a 10 deg increase in flexion, scaption, abd, ER, and IR AROM for improved stiffness and reaching.  Baseline:  Goal status: 60% MET 6/25  3.  Pt will report at least a 25% improvement in reaching activities.  Baseline:  Goal status: MET 6/22    LONG TERM GOALS: Target date: 05/03/2023  Pt will report increased usage of R UE with household cleaning and reaching into cabinets and decreased compensatory usage of L UE.  Baseline:  Goal status: MET 6/22  2.  Pt will report she is reaching into overhead cabinets with R UE without difficulty.   Baseline:  Goal status: PROGRESSING 6/25  3.  Pt will report she is able to dress without significant pain and difficulty.  Baseline:  Goal status: IN PROGRESS 6/25  4.  Pt will demo improved R shoulder AROM to at least 140 deg in flexion and scaption, 60 deg in ER, and 55 deg in IR for improved performance of ADLs/IADLs and reaching activities.  Baseline:  Goal status: IN PROGRESS 6/25   PLAN:  PT FREQUENCY: 2x/week  PT DURATION: 4-5 weeks  PLANNED INTERVENTIONS: Therapeutic exercises, Therapeutic activity, Neuromuscular re-education, Patient/Family education, Self Care, Joint mobilization, Aquatic Therapy, Dry Needling, Electrical stimulation, Spinal mobilization, Cryotherapy, Moist heat, Taping, Ultrasound, Manual therapy, and Re-evaluation  PLAN FOR NEXT SESSION: Cont with shoulder ROM, strengthening and joint mobs.  Riki Altes, PTA  04/16/23 12:18 PM

## 2023-04-20 ENCOUNTER — Ambulatory Visit (HOSPITAL_BASED_OUTPATIENT_CLINIC_OR_DEPARTMENT_OTHER): Payer: HMO | Admitting: Physical Therapy

## 2023-04-20 DIAGNOSIS — M6281 Muscle weakness (generalized): Secondary | ICD-10-CM

## 2023-04-20 DIAGNOSIS — M25511 Pain in right shoulder: Secondary | ICD-10-CM

## 2023-04-20 DIAGNOSIS — M25611 Stiffness of right shoulder, not elsewhere classified: Secondary | ICD-10-CM | POA: Diagnosis not present

## 2023-04-20 NOTE — Therapy (Signed)
OUTPATIENT PHYSICAL THERAPY SHOULDER TREATMENT   Patient Name: Bianca Phillips MRN: [id number] DOB:July 28, 1956, 67 y.o., female Today's Date: 04/21/2023  END OF SESSION:  PT End of Session - 04/20/23 1408     Visit Number 12    Number of Visits 18    Date for PT Re-Evaluation 05/03/23    Authorization Type HTA    PT Start Time 1406    PT Stop Time 1447    PT Time Calculation (min) 41 min    Activity Tolerance Patient tolerated treatment well    Behavior During Therapy [facility] for tasks assessed/performed                 Past Medical History:  Diagnosis Date   Complication of anesthesia    Constipation    GAD (generalized anxiety disorder)    Heart murmur    Heart palpitations    evaluated by cardiologist--- dr h. Shari Prows, note in epic 04-03-2021, normal echo and monitor showed NSR w/ SVT   History of COVID-19    per pt had covid twice in 12/ 2020 with mild symptoms with residual intermittant loss of taste;  and 04/ 2022 with mild symptoms that resolved   History of hyperthyroidism    age 29 s/p RAI   History of kidney stones    History of recurrent UTIs    History of repair of hiatal hernia 02/2020   Hyperlipidemia    Hypothyroidism, postablative    age 44  s/p RAI for hyperthyroidism;  followed by pcp   IDA (iron deficiency anemia)    MDD (major depressive disorder)    OA (osteoarthritis)    Right ureteral calculus    Seasonal allergic rhinitis    Type 2 diabetes mellitus (HCC)    followed by pcp    (07-14-2021  per pt checks blood sugar at home 2-3 times weekly,  fasting sugar --120-130)   Urgency of urination    Wears hearing aid in both ears    Past Surgical History:  Procedure Laterality Date   CESAREAN SECTION     x2  last one 1986   CYSTOSCOPY/URETEROSCOPY/HOLMIUM LASER/STENT PLACEMENT  2012   approx   CYSTOSCOPY/URETEROSCOPY/HOLMIUM LASER/STENT PLACEMENT Right 07/20/2021   Procedure: CYSTOSCOPY/RETROGRADE/URETEROSCOPY/HOLMIUM LASER/STENT  PLACEMENT;  Surgeon: Jannifer Hick, MD;  Location: [facility];  Service: Urology;  Laterality: Right;  ONLY NEEDS 45 MIN   LAPAROSCOPIC NISSEN FUNDOPLICATION  02/2020   TOTAL ABDOMINAL HYSTERECTOMY W/ BILATERAL SALPINGOOPHORECTOMY  2002   TOTAL HIP ARTHROPLASTY Right 08/30/2021   Procedure: TOTAL HIP ARTHROPLASTY ANTERIOR APPROACH;  Surgeon: Kathryne Hitch, MD;  Location: [facility] OR;  Service: Orthopedics;  Laterality: Right;   Patient Active Problem List   Diagnosis Date Noted   Pain in left shin 01/19/2023   Chronic constipation 01/19/2023   Acute non-recurrent frontal sinusitis 07/14/2022   Closed right hip fracture (HCC) 08/29/2021   Insomnia 06/24/2021   Candida lusitanea infection 03/16/2021   Chronic kidney disease (CKD) stage G3b/A2, moderately decreased glomerular filtration rate (GFR) between 30-44 mL/min/1.73 square meter and albuminuria creatinine ratio between 30-299 mg/g (HCC) 03/16/2021   Acute midline low back pain without sciatica 02/24/2021   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   Tachycardia 02/05/2021   Itching in the vaginal area 02/05/2021   Generalized anxiety disorder 02/05/2021   Depression, major, single episode, mild (HCC) 02/05/2021   Heart palpitations 02/05/2021   Adhesive capsulitis of right shoulder 02/05/2021   Urinary frequency 02/05/2021   Type 2 diabetes  mellitus with hyperglycemia, without long-term current use of insulin (HCC) 02/05/2021   Postoperative hypothyroidism 02/05/2021   Hyperlipidemia associated with type 2 diabetes mellitus (HCC) 02/05/2021   Intractable migraine without aura and without status migrainosus 02/05/2021   Seasonal allergic rhinitis due to pollen 02/05/2021   Hiatal hernia with GERD 02/27/2020   Lump of right breast 11/28/2019   Angina pectoris (HCC) 10/12/2015   Kidney stone 12/16/2014    PCP: Tollie Eth, NP  REFERRING PROVIDER: Tollie Eth, NP  REFERRING DIAG: M75.01 (ICD-10-CM) - Adhesive  capsulitis of right shoulder  THERAPY DIAG:  Stiffness of right shoulder, not elsewhere classified  Right shoulder pain, unspecified chronicity  Muscle weakness (generalized)  Rationale for Evaluation and Treatment: Rehabilitation  ONSET DATE: MD order 01/17/2023 ; chronic sx's  SUBJECTIVE:                                                                                                                                                                                      SUBJECTIVE STATEMENT: Pt reports she has a H/A.  Pt denies shoulder pain currently.  Pt reports a little soreness after prior Rx.  Pt reports she has some pain with initially getting up which goes away with movement.  Pt is able to fasten and unhook her bra behind her back.  Pt states she was able to lift the box of protein drinks with R UE which is an improvement.  States she has not been compliant with HEP as much as she should, but has been trying to work her shoulder in the pool.   Hand dominance: Right  PERTINENT HISTORY: DM type 2 R THA in 2022 depression Anemia   PAIN:  NPRS:  0/10 current and best, 3-4/10 worst Location:  R shoulder  PRECAUTIONS: Other: R THA  WEIGHT BEARING RESTRICTIONS: No  FALLS:  Has patient fallen in last 6 months? No  LIVING ENVIRONMENT: Lives with: lives alone   OCCUPATION: Pt is retired  PLOF: Independent.  Pt has had chronic stiffness in shoulder which has affected reaching and daily activities.   PATIENT GOALS:to be able to reach normally and to do the same activities on R that she does with L  NEXT MD VISIT:   OBJECTIVE:     TODAY'S TREATMENT:  Therapeutic Exercise:  Pt received PROM right shoulder in flex, abd, ER and IR   Pt performed: Supine serratus punch 2# 2x10 Supine shoulder ABC x 1 rep with 2# Supine flexion 2x10  3# TB IR and ER 2x10ea RTB Standing flexion 3# 2x10 Standing scaption 2# 2x10 Standing wand hz add x15 and wand IR x15 Overhead ball 4way 3#- x20ea Overhead ball lift off wall 2.2#- 2x10 anterior capsule stretch at doorway 3x10-15 sec   PATIENT EDUCATION: Education details:  HEP, POC, rationale of exercises, exercise form, and relevant anatomy.  PT instructed pt to continue with HEP. Person educated: Patient Education method: Explanation, Demonstration, Tactile cues, Verbal cues Education comprehension:  verbalized understanding, returned demonstration, verbal cues required, tactile cues required  HOME EXERCISE PROGRAM: Access Code: [email or web address] URL: [email or web address] Date: 02/15/2023 Prepared by: Aaron Edelman   ASSESSMENT:  CLINICAL IMPRESSION: Pt is improving with function as evidenced by subjective reports of donning/doffing bra and lifting the box of protein drinks at home.  Pt continues to have tightness in R shoulder ROM though has improved overall.  She is tighter in ER PROM today and seems to have worse ER based upon visual observation.  PT reviewed home exercises to improve ER mobility.  She performed exercises well and is improving with R shoulder strength as evidenced by performance of exercises.  Pt responded well to Rx reporting improved tightness and having no pain after Rx.     OBJECTIVE IMPAIRMENTS: decreased ROM, decreased strength, hypomobility, impaired flexibility, impaired UE functional use, and pain.   ACTIVITY LIMITATIONS: lifting, dressing, and reach over head  PARTICIPATION LIMITATIONS: cleaning  PERSONAL FACTORS: Time since onset of injury/illness/exacerbation and 1-2 comorbidities: DM type 2 and anemia  are also affecting patient's functional outcome.   REHAB POTENTIAL: Good  CLINICAL DECISION MAKING: Stable/uncomplicated  EVALUATION COMPLEXITY: Low   GOALS:   SHORT TERM GOALS: Target date: 03/08/2023    Pt will be independent  and compliant with HEP for improved ROM, strength, pain, and function. Baseline: Goal status: MET 6/22  2.  Pt will demo at least a 10 deg increase in flexion, scaption, abd, ER, and IR AROM for improved stiffness and reaching.  Baseline:  Goal status: 60% MET 6/25  3.  Pt will report at least a 25% improvement in reaching activities.  Baseline:  Goal status: MET 6/22    LONG TERM GOALS: Target date: 05/03/2023  Pt will report increased usage of R UE with household cleaning and reaching into cabinets and decreased compensatory usage of L UE.  Baseline:  Goal status: MET 6/22  2.  Pt will report she is reaching into overhead cabinets with R UE without difficulty.   Baseline:  Goal status: PROGRESSING 6/25  3.  Pt will report she is able to dress without significant pain and difficulty.  Baseline:  Goal status: IN PROGRESS 6/25  4.  Pt will demo improved R shoulder AROM to at least 140 deg in flexion and scaption, 60 deg in ER, and 55 deg in IR for improved performance of ADLs/IADLs and reaching activities.  Baseline:  Goal status: IN PROGRESS 6/25   PLAN:  PT FREQUENCY: 2x/week  PT DURATION: 4-5 weeks  PLANNED INTERVENTIONS: Therapeutic exercises, Therapeutic activity, Neuromuscular re-education, Patient/Family education, Self Care, Joint mobilization, Aquatic Therapy, Dry Needling, Electrical stimulation, Spinal mobilization, Cryotherapy, Moist heat, Taping, Ultrasound, Manual therapy, and Re-evaluation  PLAN FOR NEXT SESSION: Cont with shoulder ROM, strengthening and joint mobs.   Audie Clear III PT, DPT 04/21/23  9:56 PM

## 2023-04-21 ENCOUNTER — Encounter (HOSPITAL_BASED_OUTPATIENT_CLINIC_OR_DEPARTMENT_OTHER): Payer: Self-pay | Admitting: Physical Therapy

## 2023-04-22 ENCOUNTER — Encounter (HOSPITAL_BASED_OUTPATIENT_CLINIC_OR_DEPARTMENT_OTHER): Payer: Self-pay

## 2023-04-22 ENCOUNTER — Ambulatory Visit (HOSPITAL_BASED_OUTPATIENT_CLINIC_OR_DEPARTMENT_OTHER): Payer: HMO

## 2023-04-22 DIAGNOSIS — M25511 Pain in right shoulder: Secondary | ICD-10-CM

## 2023-04-22 DIAGNOSIS — M6281 Muscle weakness (generalized): Secondary | ICD-10-CM

## 2023-04-22 DIAGNOSIS — M25611 Stiffness of right shoulder, not elsewhere classified: Secondary | ICD-10-CM | POA: Diagnosis not present

## 2023-04-22 NOTE — Therapy (Signed)
OUTPATIENT PHYSICAL THERAPY SHOULDER TREATMENT   Patient Name: Bianca Phillips MRN: [id number] DOB:10/05/1955, 67 y.o., female Today's Date: 04/22/2023  END OF SESSION:  PT End of Session - 04/22/23 1604     Visit Number 13    Number of Visits 18    Date for PT Re-Evaluation 05/03/23    Authorization Type HTA    PT Start Time 1603    PT Stop Time 1639    PT Time Calculation (min) 36 min    Activity Tolerance Patient tolerated treatment well    Behavior During Therapy [facility] for tasks assessed/performed                  Past Medical History:  Diagnosis Date   Complication of anesthesia    Constipation    GAD (generalized anxiety disorder)    Heart murmur    Heart palpitations    evaluated by cardiologist--- dr h. Shari Prows, note in epic 04-03-2021, normal echo and monitor showed NSR w/ SVT   History of COVID-19    per pt had covid twice in 12/ 2020 with mild symptoms with residual intermittant loss of taste;  and 04/ 2022 with mild symptoms that resolved   History of hyperthyroidism    age 63 s/p RAI   History of kidney stones    History of recurrent UTIs    History of repair of hiatal hernia 02/2020   Hyperlipidemia    Hypothyroidism, postablative    age 27  s/p RAI for hyperthyroidism;  followed by pcp   IDA (iron deficiency anemia)    MDD (major depressive disorder)    OA (osteoarthritis)    Right ureteral calculus    Seasonal allergic rhinitis    Type 2 diabetes mellitus (HCC)    followed by pcp    (07-14-2021  per pt checks blood sugar at home 2-3 times weekly,  fasting sugar --120-130)   Urgency of urination    Wears hearing aid in both ears    Past Surgical History:  Procedure Laterality Date   CESAREAN SECTION     x2  last one 1986   CYSTOSCOPY/URETEROSCOPY/HOLMIUM LASER/STENT PLACEMENT  2012   approx   CYSTOSCOPY/URETEROSCOPY/HOLMIUM LASER/STENT PLACEMENT Right 07/20/2021   Procedure: CYSTOSCOPY/RETROGRADE/URETEROSCOPY/HOLMIUM LASER/STENT  PLACEMENT;  Surgeon: Jannifer Hick, MD;  Location: [facility];  Service: Urology;  Laterality: Right;  ONLY NEEDS 45 MIN   LAPAROSCOPIC NISSEN FUNDOPLICATION  02/2020   TOTAL ABDOMINAL HYSTERECTOMY W/ BILATERAL SALPINGOOPHORECTOMY  2002   TOTAL HIP ARTHROPLASTY Right 08/30/2021   Procedure: TOTAL HIP ARTHROPLASTY ANTERIOR APPROACH;  Surgeon: Kathryne Hitch, MD;  Location: [facility] OR;  Service: Orthopedics;  Laterality: Right;   Patient Active Problem List   Diagnosis Date Noted   Pain in left shin 01/19/2023   Chronic constipation 01/19/2023   Acute non-recurrent frontal sinusitis 07/14/2022   Closed right hip fracture (HCC) 08/29/2021   Insomnia 06/24/2021   Candida lusitanea infection 03/16/2021   Chronic kidney disease (CKD) stage G3b/A2, moderately decreased glomerular filtration rate (GFR) between 30-44 mL/min/1.73 square meter and albuminuria creatinine ratio between 30-299 mg/g (HCC) 03/16/2021   Acute midline low back pain without sciatica 02/24/2021   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   Tachycardia 02/05/2021   Itching in the vaginal area 02/05/2021   Generalized anxiety disorder 02/05/2021   Depression, major, single episode, mild (HCC) 02/05/2021   Heart palpitations 02/05/2021   Adhesive capsulitis of right shoulder 02/05/2021   Urinary frequency 02/05/2021   Type 2  diabetes mellitus with hyperglycemia, without long-term current use of insulin (HCC) 02/05/2021   Postoperative hypothyroidism 02/05/2021   Hyperlipidemia associated with type 2 diabetes mellitus (HCC) 02/05/2021   Intractable migraine without aura and without status migrainosus 02/05/2021   Seasonal allergic rhinitis due to pollen 02/05/2021   Hiatal hernia with GERD 02/27/2020   Lump of right breast 11/28/2019   Angina pectoris (HCC) 10/12/2015   Kidney stone 12/16/2014    PCP: Tollie Eth, NP  REFERRING PROVIDER: Tollie Eth, NP  REFERRING DIAG: M75.01 (ICD-10-CM) - Adhesive  capsulitis of right shoulder  THERAPY DIAG:  Stiffness of right shoulder, not elsewhere classified  Right shoulder pain, unspecified chronicity  Muscle weakness (generalized)  Rationale for Evaluation and Treatment: Rehabilitation  ONSET DATE: MD order 01/17/2023 ; chronic sx's  SUBJECTIVE:                                                                                                                                                                                      SUBJECTIVE STATEMENT: Pt reports that she has been trying to do her HEP more frequently. Had water aerobics this morning. Has some R hand pain.   Hand dominance: Right  PERTINENT HISTORY: DM type 2 R THA in 2022 depression Anemia   PAIN:  NPRS:  0/10 current and best, 3-4/10 worst Location:  R shoulder  PRECAUTIONS: Other: R THA  WEIGHT BEARING RESTRICTIONS: No  FALLS:  Has patient fallen in last 6 months? No  LIVING ENVIRONMENT: Lives with: lives alone   OCCUPATION: Pt is retired  PLOF: Independent.  Pt has had chronic stiffness in shoulder which has affected reaching and daily activities.   PATIENT GOALS:to be able to reach normally and to do the same activities on R that she does with L  NEXT MD VISIT:   OBJECTIVE:     TODAY'S TREATMENT:  Therapeutic Exercise:  Pt received PROM right shoulder in flex, abd, ER and IR   Pt performed: Supine serratus punch 3# (cuff weight) 3x10 Supine shoulder ABC x 1 rep with 2# Supine flexion 2x10 3# cuff weight Standing flexion 3# 1x10, 2# 1x10 Standing scaption 3# 1x10, 2# 1x10 Standing wand hz add 5sx10 and wand IR 5sx3 (stopped due to pain) Overhead ball 4way 2.2#- x20ea (up/down and side/side  only. Circles bothered R hand) Overhead ball lift off wall 2.2#- 2x10 anterior capsule stretch at doorway 3x10-15 sec   Seated row machine #12- 25lbs 2x10  PATIENT EDUCATION: Education details:  HEP, POC, rationale of exercises, exercise form, and relevant anatomy.  PT instructed pt to continue with HEP. Person educated: Patient Education method: Explanation, Demonstration, Tactile cues, Verbal cues Education comprehension:  verbalized understanding, returned demonstration, verbal cues required, tactile cues required  HOME EXERCISE PROGRAM: Access Code: [email or web address] URL: [email or web address] Date: 02/15/2023 Prepared by: Aaron Edelman   ASSESSMENT:  CLINICAL IMPRESSION: Pt remains tight into end range PROM all planes. Had increased hand pain with overhead weighted circles and had increased hand and shoulder pain with wand IR, so discontinued. Limited with dumbbell use due to her hand discomfort. She had better response for using cuff weight around wrist.  Educated pt on importance of performing HEP. Will continue to progress as tolerated.     OBJECTIVE IMPAIRMENTS: decreased ROM, decreased strength, hypomobility, impaired flexibility, impaired UE functional use, and pain.   ACTIVITY LIMITATIONS: lifting, dressing, and reach over head  PARTICIPATION LIMITATIONS: cleaning  PERSONAL FACTORS: Time since onset of injury/illness/exacerbation and 1-2 comorbidities: DM type 2 and anemia  are also affecting patient's functional outcome.   REHAB POTENTIAL: Good  CLINICAL DECISION MAKING: Stable/uncomplicated  EVALUATION COMPLEXITY: Low   GOALS:   SHORT TERM GOALS: Target date: 03/08/2023    Pt will be independent and compliant with HEP for improved ROM, strength, pain, and function. Baseline: Goal status: MET 6/22  2.  Pt will demo at least a 10 deg increase in flexion, scaption, abd, ER, and IR AROM for improved stiffness and reaching.  Baseline:  Goal status: 60% MET 6/25  3.  Pt will report at least a 25% improvement in reaching activities.  Baseline:  Goal status: MET  6/22    LONG TERM GOALS: Target date: 05/03/2023  Pt will report increased usage of R UE with household cleaning and reaching into cabinets and decreased compensatory usage of L UE.  Baseline:  Goal status: MET 6/22  2.  Pt will report she is reaching into overhead cabinets with R UE without difficulty.   Baseline:  Goal status: PROGRESSING 6/25  3.  Pt will report she is able to dress without significant pain and difficulty.  Baseline:  Goal status: IN PROGRESS 6/25  4.  Pt will demo improved R shoulder AROM to at least 140 deg in flexion and scaption, 60 deg in ER, and 55 deg in IR for improved performance of ADLs/IADLs and reaching activities.  Baseline:  Goal status: IN PROGRESS 6/25   PLAN:  PT FREQUENCY: 2x/week  PT DURATION: 4-5 weeks  PLANNED INTERVENTIONS: Therapeutic exercises, Therapeutic activity, Neuromuscular re-education, Patient/Family education, Self Care, Joint mobilization, Aquatic Therapy, Dry Needling, Electrical stimulation, Spinal mobilization, Cryotherapy, Moist heat, Taping, Ultrasound, Manual therapy, and Re-evaluation  PLAN FOR NEXT SESSION: Cont with shoulder ROM, strengthening and joint mobs.   Riki Altes, PTA  04/22/23 4:47 PM

## 2023-04-26 ENCOUNTER — Ambulatory Visit (HOSPITAL_BASED_OUTPATIENT_CLINIC_OR_DEPARTMENT_OTHER): Payer: HMO | Admitting: Physical Therapy

## 2023-04-26 DIAGNOSIS — M25611 Stiffness of right shoulder, not elsewhere classified: Secondary | ICD-10-CM | POA: Diagnosis not present

## 2023-04-26 DIAGNOSIS — M25511 Pain in right shoulder: Secondary | ICD-10-CM

## 2023-04-26 DIAGNOSIS — M6281 Muscle weakness (generalized): Secondary | ICD-10-CM

## 2023-04-26 NOTE — Therapy (Signed)
OUTPATIENT PHYSICAL THERAPY SHOULDER TREATMENT   Patient Name: Bianca Phillips MRN: [id number] DOB:15-Dec-1955, 67 y.o., female Today's Date: 04/27/2023  END OF SESSION:  PT End of Session - 04/26/23 1320     Visit Number 14    Number of Visits 18    Date for PT Re-Evaluation 05/03/23    Authorization Type HTA    PT Start Time 1319    PT Stop Time 1358    PT Time Calculation (min) 39 min    Activity Tolerance Patient tolerated treatment well    Behavior During Therapy WFL for tasks assessed/performed                  Past Medical History:  Diagnosis Date   Complication of anesthesia    Constipation    GAD (generalized anxiety disorder)    Heart murmur    Heart palpitations    evaluated by cardiologist--- dr h. Shari Prows, note in epic 04-03-2021, normal echo and monitor showed NSR w/ SVT   History of COVID-19    per pt had covid twice in 12/ 2020 with mild symptoms with residual intermittant loss of taste;  and 04/ 2022 with mild symptoms that resolved   History of hyperthyroidism    age 14 s/p RAI   History of kidney stones    History of recurrent UTIs    History of repair of hiatal hernia 02/2020   Hyperlipidemia    Hypothyroidism, postablative    age 40  s/p RAI for hyperthyroidism;  followed by pcp   IDA (iron deficiency anemia)    MDD (major depressive disorder)    OA (osteoarthritis)    Right ureteral calculus    Seasonal allergic rhinitis    Type 2 diabetes mellitus (HCC)    followed by pcp    (07-14-2021  per pt checks blood sugar at home 2-3 times weekly,  fasting sugar --120-130)   Urgency of urination    Wears hearing aid in both ears    Past Surgical History:  Procedure Laterality Date   CESAREAN SECTION     x2  last one 1986   CYSTOSCOPY/URETEROSCOPY/HOLMIUM LASER/STENT PLACEMENT  2012   approx   CYSTOSCOPY/URETEROSCOPY/HOLMIUM LASER/STENT PLACEMENT Right 07/20/2021   Procedure: CYSTOSCOPY/RETROGRADE/URETEROSCOPY/HOLMIUM LASER/STENT  PLACEMENT;  Surgeon: Jannifer Hick, MD;  Location: [facility];  Service: Urology;  Laterality: Right;  ONLY NEEDS 45 MIN   LAPAROSCOPIC NISSEN FUNDOPLICATION  02/2020   TOTAL ABDOMINAL HYSTERECTOMY W/ BILATERAL SALPINGOOPHORECTOMY  2002   TOTAL HIP ARTHROPLASTY Right 08/30/2021   Procedure: TOTAL HIP ARTHROPLASTY ANTERIOR APPROACH;  Surgeon: Kathryne Hitch, MD;  Location: [facility];  Service: Orthopedics;  Laterality: Right;   Patient Active Problem List   Diagnosis Date Noted   Pain in left shin 01/19/2023   Chronic constipation 01/19/2023   Acute non-recurrent frontal sinusitis 07/14/2022   Closed right hip fracture (HCC) 08/29/2021   Insomnia 06/24/2021   Candida lusitanea infection 03/16/2021   Chronic kidney disease (CKD) stage G3b/A2, moderately decreased glomerular filtration rate (GFR) between 30-44 mL/min/1.73 square meter and albuminuria creatinine ratio between 30-299 mg/g (HCC) 03/16/2021   Acute midline low back pain without sciatica 02/24/2021   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   Tachycardia 02/05/2021   Itching in the vaginal area 02/05/2021   Generalized anxiety disorder 02/05/2021   Depression, major, single episode, mild (HCC) 02/05/2021   Heart palpitations 02/05/2021   Adhesive capsulitis of right shoulder 02/05/2021   Urinary frequency 02/05/2021   Type 2  diabetes mellitus with hyperglycemia, without long-term current use of insulin (HCC) 02/05/2021   Postoperative hypothyroidism 02/05/2021   Hyperlipidemia associated with type 2 diabetes mellitus (HCC) 02/05/2021   Intractable migraine without aura and without status migrainosus 02/05/2021   Seasonal allergic rhinitis due to pollen 02/05/2021   Hiatal hernia with GERD 02/27/2020   Lump of right breast 11/28/2019   Angina pectoris (HCC) 10/12/2015   Kidney stone 12/16/2014    PCP: Tollie Eth, NP  REFERRING PROVIDER: Tollie Eth, NP  REFERRING DIAG: M75.01 (ICD-10-CM) - Adhesive  capsulitis of right shoulder  THERAPY DIAG:  Stiffness of right shoulder, not elsewhere classified  Right shoulder pain, unspecified chronicity  Muscle weakness (generalized)  Rationale for Evaluation and Treatment: Rehabilitation  ONSET DATE: MD order 01/17/2023 ; chronic sx's  SUBJECTIVE:                                                                                                                                                                                      SUBJECTIVE STATEMENT: Pt reports she was able to lift a little tree to put in her home.  Pt reports she is trying to be more consistent with HEP.  Pt is performing water aerobics 3 times per week and sometimes 4 times per week.  Pt denies any adverse effects after prior Rx.  Hand dominance: Right  PERTINENT HISTORY: DM type 2 R THA in 2022 depression Anemia   PAIN:  NPRS:  0/10 current and best, 3-4/10 worst Location:  R shoulder  PRECAUTIONS: Other: R THA  WEIGHT BEARING RESTRICTIONS: No  FALLS:  Has patient fallen in last 6 months? No  LIVING ENVIRONMENT: Lives with: lives alone   OCCUPATION: Pt is retired  PLOF: Independent.  Pt has had chronic stiffness in shoulder which has affected reaching and daily activities.   PATIENT GOALS:to be able to reach normally and to do the same activities on R that she does with L  NEXT MD VISIT:   OBJECTIVE:     TODAY'S TREATMENT:  Therapeutic Exercise:  Pt received PROM right shoulder in flex, abd, ER and IR   Pt performed: Supine serratus punch 3# (cuff weight) 3x10 Standing flexion 3# 1x10, 2# 1x10 Standing scaption 2# 2x10 Standing wand hz add x10 and wand IR x 10 anterior capsule stretch at doorway 3x12-15 sec  Seated row machine #12-  25lbs 2x10  Neuro Re-ed Activities: Overhead ball 4way 2.2#- x20ea  (up/down and side/side  only. Circles bothered R hand) Overhead ball lift off wall 2.2#- 2x10 ABC with 1.1# ball on wall  PATIENT EDUCATION: Education details:  HEP, POC, rationale of exercises, exercise form, and relevant anatomy.  PT instructed pt to continue with HEP. Person educated: Patient Education method: Explanation, Demonstration, Tactile cues, Verbal cues Education comprehension:  verbalized understanding, returned demonstration, verbal cues required, tactile cues required  HOME EXERCISE PROGRAM: Access Code: [email or web address] URL: [email or web address] Date: 02/15/2023 Prepared by: Aaron Edelman   ASSESSMENT:  CLINICAL IMPRESSION: Pt continues to have tightness and limitations with shoulder PROM though has made progress overall.  Though pt has limited ER, she had improved PROM in ER today based on visual observation.  Pt is improving with functional usage of R shoulder.  Pt has weakness in R shoulder.  PT monitored hand pain t/o Rx.  She tolerated exercises well and performed exercises well.  She requires instruction and cuing for correct form and positioning with exercises including seated rows.  Pt responded well to Rx reporting no increased pain after Rx.     OBJECTIVE IMPAIRMENTS: decreased ROM, decreased strength, hypomobility, impaired flexibility, impaired UE functional use, and pain.   ACTIVITY LIMITATIONS: lifting, dressing, and reach over head  PARTICIPATION LIMITATIONS: cleaning  PERSONAL FACTORS: Time since onset of injury/illness/exacerbation and 1-2 comorbidities: DM type 2 and anemia  are also affecting patient's functional outcome.   REHAB POTENTIAL: Good  CLINICAL DECISION MAKING: Stable/uncomplicated  EVALUATION COMPLEXITY: Low   GOALS:   SHORT TERM GOALS: Target date: 03/08/2023    Pt will be independent and compliant with HEP for improved ROM, strength, pain, and function. Baseline: Goal status: MET 6/22  2.  Pt will demo at least a  10 deg increase in flexion, scaption, abd, ER, and IR AROM for improved stiffness and reaching.  Baseline:  Goal status: 60% MET 6/25  3.  Pt will report at least a 25% improvement in reaching activities.  Baseline:  Goal status: MET 6/22    LONG TERM GOALS: Target date: 05/03/2023  Pt will report increased usage of R UE with household cleaning and reaching into cabinets and decreased compensatory usage of L UE.  Baseline:  Goal status: MET 6/22  2.  Pt will report she is reaching into overhead cabinets with R UE without difficulty.   Baseline:  Goal status: PROGRESSING 6/25  3.  Pt will report she is able to dress without significant pain and difficulty.  Baseline:  Goal status: IN PROGRESS 6/25  4.  Pt will demo improved R shoulder AROM to at least 140 deg in flexion and scaption, 60 deg in ER, and 55 deg in IR for improved performance of ADLs/IADLs and reaching activities.  Baseline:  Goal status: IN PROGRESS 6/25   PLAN:  PT FREQUENCY: 2x/week  PT DURATION: 4-5 weeks  PLANNED INTERVENTIONS: Therapeutic exercises, Therapeutic activity, Neuromuscular re-education, Patient/Family education, Self Care, Joint mobilization, Aquatic Therapy, Dry Needling, Electrical stimulation, Spinal mobilization, Cryotherapy, Moist heat, Taping, Ultrasound, Manual therapy, and Re-evaluation  PLAN FOR NEXT SESSION: Cont with shoulder ROM, strengthening and  joint mobs.   Audie Clear III PT, DPT 04/27/23 6:37 PM

## 2023-04-27 ENCOUNTER — Encounter (HOSPITAL_BASED_OUTPATIENT_CLINIC_OR_DEPARTMENT_OTHER): Payer: Self-pay | Admitting: Physical Therapy

## 2023-04-28 ENCOUNTER — Ambulatory Visit (HOSPITAL_BASED_OUTPATIENT_CLINIC_OR_DEPARTMENT_OTHER): Payer: HMO | Admitting: Physical Therapy

## 2023-04-28 ENCOUNTER — Encounter (HOSPITAL_BASED_OUTPATIENT_CLINIC_OR_DEPARTMENT_OTHER): Payer: Self-pay

## 2023-05-03 NOTE — Therapy (Incomplete)
OUTPATIENT PHYSICAL THERAPY SHOULDER TREATMENT / PROGRESS NOTE   Patient Name: Bianca Phillips MRN: [id number] DOB:06-27-1956, 67 y.o., female Today's Date: 05/04/2023  END OF SESSION:  PT End of Session - 05/04/23 1242     Visit Number 15    Number of Visits 16    Date for PT Re-Evaluation 05/11/23    Authorization Type HTA    PT Start Time 1155    PT Stop Time 1237    PT Time Calculation (min) 42 min    Activity Tolerance Patient tolerated treatment well    Behavior During Therapy [facility] for tasks assessed/performed                   Past Medical History:  Diagnosis Date   Complication of anesthesia    Constipation    GAD (generalized anxiety disorder)    Heart murmur    Heart palpitations    evaluated by cardiologist--- dr h. Shari Prows, note in epic 04-03-2021, normal echo and monitor showed NSR w/ SVT   History of COVID-19    per pt had covid twice in 12/ 2020 with mild symptoms with residual intermittant loss of taste;  and 04/ 2022 with mild symptoms that resolved   History of hyperthyroidism    age 12 s/p RAI   History of kidney stones    History of recurrent UTIs    History of repair of hiatal hernia 02/2020   Hyperlipidemia    Hypothyroidism, postablative    age 52  s/p RAI for hyperthyroidism;  followed by pcp   IDA (iron deficiency anemia)    MDD (major depressive disorder)    OA (osteoarthritis)    Right ureteral calculus    Seasonal allergic rhinitis    Type 2 diabetes mellitus (HCC)    followed by pcp    (07-14-2021  per pt checks blood sugar at home 2-3 times weekly,  fasting sugar --120-130)   Urgency of urination    Wears hearing aid in both ears    Past Surgical History:  Procedure Laterality Date   CESAREAN SECTION     x2  last one 1986   CYSTOSCOPY/URETEROSCOPY/HOLMIUM LASER/STENT PLACEMENT  2012   approx   CYSTOSCOPY/URETEROSCOPY/HOLMIUM LASER/STENT PLACEMENT Right 07/20/2021   Procedure: CYSTOSCOPY/RETROGRADE/URETEROSCOPY/HOLMIUM  LASER/STENT PLACEMENT;  Surgeon: Jannifer Hick, MD;  Location: [facility];  Service: Urology;  Laterality: Right;  ONLY NEEDS 45 MIN   LAPAROSCOPIC NISSEN FUNDOPLICATION  02/2020   TOTAL ABDOMINAL HYSTERECTOMY W/ BILATERAL SALPINGOOPHORECTOMY  2002   TOTAL HIP ARTHROPLASTY Right 08/30/2021   Procedure: TOTAL HIP ARTHROPLASTY ANTERIOR APPROACH;  Surgeon: Kathryne Hitch, MD;  Location: [facility];  Service: Orthopedics;  Laterality: Right;   Patient Active Problem List   Diagnosis Date Noted   Pain in left shin 01/19/2023   Chronic constipation 01/19/2023   Acute non-recurrent frontal sinusitis 07/14/2022   Closed right hip fracture (HCC) 08/29/2021   Insomnia 06/24/2021   Candida lusitanea infection 03/16/2021   Chronic kidney disease (CKD) stage G3b/A2, moderately decreased glomerular filtration rate (GFR) between 30-44 mL/min/1.73 square meter and albuminuria creatinine ratio between 30-299 mg/g (HCC) 03/16/2021   Acute midline low back pain without sciatica 02/24/2021   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   Tachycardia 02/05/2021   Itching in the vaginal area 02/05/2021   Generalized anxiety disorder 02/05/2021   Depression, major, single episode, mild (HCC) 02/05/2021   Heart palpitations 02/05/2021   Adhesive capsulitis of right shoulder 02/05/2021   Urinary frequency 02/05/2021  Type 2 diabetes mellitus with hyperglycemia, without long-term current use of insulin (HCC) 02/05/2021   Postoperative hypothyroidism 02/05/2021   Hyperlipidemia associated with type 2 diabetes mellitus (HCC) 02/05/2021   Intractable migraine without aura and without status migrainosus 02/05/2021   Seasonal allergic rhinitis due to pollen 02/05/2021   Hiatal hernia with GERD 02/27/2020   Lump of right breast 11/28/2019   Angina pectoris (HCC) 10/12/2015   Kidney stone 12/16/2014    PCP: Tollie Eth, NP  REFERRING PROVIDER: Tollie Eth, NP  REFERRING DIAG: M75.01  (ICD-10-CM) - Adhesive capsulitis of right shoulder  THERAPY DIAG:  Stiffness of right shoulder, not elsewhere classified  Right shoulder pain, unspecified chronicity  Muscle weakness (generalized)  Rationale for Evaluation and Treatment: Rehabilitation  ONSET DATE: MD order 01/17/2023 ; chronic sx's  SUBJECTIVE:                                                                                                                                                                                      SUBJECTIVE STATEMENT: Pt reports she was able to carry 4 grocery bags in her hand.  Pt states she feels better with her aquatic exercises.  Pt is performing water aerobics 3 times per week and sometimes 4 times per week.  She did have some pain after her aquatic class though is not having that pain now.  Pt states she is not having pain. Pt states she is improving with reaching and is less apprehensive with reaching.   Pt reports she has been performing her HEP though has forgotten the wand ER exercise.   Pt denies any adverse effects after prior Rx.  Pt states she is able to perform dressing without significant pain and difficulty.  Pt able to don/doff bra.   Hand dominance: Right  PERTINENT HISTORY: DM type 2 R THA in 2022 depression Anemia   PAIN:  NPRS:  0/10 current and best, 1-2/10 worst Location:  R shoulder  PRECAUTIONS: Other: R THA  WEIGHT BEARING RESTRICTIONS: No  FALLS:  Has patient fallen in last 6 months? No  LIVING ENVIRONMENT: Lives with: lives alone   OCCUPATION: Pt is retired  PLOF: Independent.  Pt has had chronic stiffness in shoulder which has affected reaching and daily activities.   PATIENT GOALS:to be able to reach normally and to do the same activities on R that she does with L  NEXT MD VISIT:   OBJECTIVE:     TODAY'S TREATMENT:  Therapeutic Exercise:  Therapeutic Exercise:   AROM Prior/Current:   ER:  40 / 35 IR:  58  / 62            Flexion:  130 / 137                    Scaption:  132 deg  /  141             Abduction:  107 / 110      PROM: Flex:  153 ER:  47 deg   Shoulder strength:      Flex:  5/5    Scaption:  5/5    Abd:  5/5 w/n available range    ER:  4+/5    IR:  5/5   FOTO:  Initial/Current:  63/72.  Goal of 67.   Pt received PROM right shoulder in flex, abd, ER and IR   Pt performed: Seated wand ER x 10 Supine wand ER x10 Supine 90/90 wand ER approx 15 Standing flexion 2# 2x10 Standing scaption 2# 2x10 Seated row machine #12-  25lbs 2x10    PATIENT EDUCATION: Education details:  HEP, POC, rationale of exercises, exercise form, and relevant anatomy.  PT instructed pt to continue with HEP. Person educated: Patient Education method: Explanation, Demonstration, Tactile cues, Verbal cues Education comprehension:  verbalized understanding, returned demonstration, verbal cues required, tactile cues required  HOME EXERCISE PROGRAM: Access Code: [email or web address] URL: [email or web address] Date: 02/15/2023 Prepared by: Aaron Edelman   ASSESSMENT:  CLINICAL IMPRESSION: Pt is making good progress in pain and function as evidenced by subjective reports.  She reports much improved pain and is not having pain after aquatic class.  She has improved with performance of reaching activities and also having less pain with reaching.  Pt states she is able to perform dressing without significant pain and difficulty.  She has improved with ROM though continues to have tightness and limitations with shoulder ROM especially ER.  Pt has worse ER ROM today compared to prior testing.  PT educated pt in home exercises to perform to improve ER ROM.  PT instructed pt in correct form and positioning of ER AAROM exercises.  Pt has good strength t/o R shoulder with slight weakness in ER.   though has made progress overall.  Pt has met all goals except partially meeting ROM goals (STG #2 and LTG #4).  Pt should benefit from 1 more visit to review and update HEP as needed to ensure proper performance and understanding of correct exercises.     OBJECTIVE IMPAIRMENTS: decreased ROM, decreased strength, hypomobility, impaired flexibility, impaired UE functional use, and pain.   ACTIVITY LIMITATIONS: lifting, dressing, and reach over head  PARTICIPATION LIMITATIONS: cleaning  PERSONAL FACTORS: Time since onset of injury/illness/exacerbation and 1-2 comorbidities: DM type 2 and anemia  are also affecting patient's functional outcome.   REHAB POTENTIAL: Good  CLINICAL DECISION MAKING: Stable/uncomplicated  EVALUATION COMPLEXITY: Low   GOALS:   SHORT TERM GOALS: Target date: 03/08/2023    Pt will be independent and compliant with HEP for improved ROM, strength, pain, and function. Baseline: Goal status: MET 6/22  2.  Pt will demo at least a 10 deg increase in flexion, scaption, abd, ER, and IR AROM for improved stiffness and reaching.  Baseline:  Goal status: 80% MET 7/31  3.  Pt will report at least a 25% improvement in reaching activities.  Baseline:  Goal status: MET 6/22    LONG TERM GOALS:  Target date: 05/03/2023  Pt will report increased usage of R UE with household cleaning and reaching into cabinets and decreased compensatory usage of L UE.  Baseline:  Goal status: MET 6/22  2.  Pt will report she is reaching into overhead cabinets with R UE without difficulty.   Baseline:  Goal status: GOAL MET  7/31  3.  Pt will report she is able to dress without significant pain and difficulty.  Baseline:  Goal status:  GOAL MET  7/31  4.  Pt will demo improved R shoulder AROM to at least 140 deg in flexion and scaption, 60 deg in ER, and 55 deg in IR for improved performance of ADLs/IADLs and reaching activities.  Baseline:  Goal status: 50% MET 7/31 Target date:  05/11/2023   PLAN:  PT FREQUENCY: 1 visit  PT DURATION: 1 week  PLANNED INTERVENTIONS: Therapeutic exercises, Therapeutic activity, Neuromuscular re-education, Patient/Family education, Self Care, Joint mobilization, Aquatic Therapy, Dry Needling, Electrical stimulation, Spinal mobilization, Cryotherapy, Moist heat, Taping, Ultrasound, Manual therapy, and Re-evaluation  PLAN FOR NEXT SESSION:  1 more visit then discharge.  Review HEP and update HEP as needed.   Audie Clear III PT, DPT 05/04/23 11:59 PM

## 2023-05-04 ENCOUNTER — Encounter (HOSPITAL_BASED_OUTPATIENT_CLINIC_OR_DEPARTMENT_OTHER): Payer: Self-pay | Admitting: Physical Therapy

## 2023-05-04 ENCOUNTER — Ambulatory Visit (HOSPITAL_BASED_OUTPATIENT_CLINIC_OR_DEPARTMENT_OTHER): Payer: HMO | Admitting: Physical Therapy

## 2023-05-04 DIAGNOSIS — M25511 Pain in right shoulder: Secondary | ICD-10-CM

## 2023-05-04 DIAGNOSIS — M6281 Muscle weakness (generalized): Secondary | ICD-10-CM

## 2023-05-04 DIAGNOSIS — M25611 Stiffness of right shoulder, not elsewhere classified: Secondary | ICD-10-CM | POA: Diagnosis not present

## 2023-05-06 ENCOUNTER — Encounter (HOSPITAL_BASED_OUTPATIENT_CLINIC_OR_DEPARTMENT_OTHER): Payer: Self-pay

## 2023-05-06 ENCOUNTER — Encounter (HOSPITAL_BASED_OUTPATIENT_CLINIC_OR_DEPARTMENT_OTHER): Payer: HMO

## 2023-05-09 ENCOUNTER — Other Ambulatory Visit: Payer: Self-pay | Admitting: Nurse Practitioner

## 2023-05-11 ENCOUNTER — Ambulatory Visit (HOSPITAL_BASED_OUTPATIENT_CLINIC_OR_DEPARTMENT_OTHER): Payer: HMO | Attending: Nurse Practitioner | Admitting: Physical Therapy

## 2023-05-11 DIAGNOSIS — M25611 Stiffness of right shoulder, not elsewhere classified: Secondary | ICD-10-CM | POA: Insufficient documentation

## 2023-05-11 DIAGNOSIS — M25511 Pain in right shoulder: Secondary | ICD-10-CM | POA: Diagnosis not present

## 2023-05-11 DIAGNOSIS — M6281 Muscle weakness (generalized): Secondary | ICD-10-CM | POA: Insufficient documentation

## 2023-05-11 NOTE — Therapy (Incomplete)
OUTPATIENT PHYSICAL THERAPY SHOULDER TREATMENT / PROGRESS NOTE   Patient Name: Bianca Phillips MRN: [id number] DOB:[birth year], 67 y.o., female Today's Date: 05/11/2023  END OF SESSION:  PT End of Session - 05/11/23 1622     Visit Number 16    Number of Visits 16    Date for PT Re-Evaluation 05/11/23    Authorization Type HTA    PT Start Time 1620                   Past Medical History:  Diagnosis Date   Complication of anesthesia    Constipation    GAD (generalized anxiety disorder)    Heart murmur    Heart palpitations    evaluated by cardiologist--- dr h. Shari Prows, note in epic 04-03-2021, normal echo and monitor showed NSR w/ SVT   History of COVID-19    per pt had covid twice in 12/ 2020 with mild symptoms with residual intermittant loss of taste;  and 04/ 2022 with mild symptoms that resolved   History of hyperthyroidism    age 11 s/p RAI   History of kidney stones    History of recurrent UTIs    History of repair of hiatal hernia 02/2020   Hyperlipidemia    Hypothyroidism, postablative    age 64  s/p RAI for hyperthyroidism;  followed by pcp   IDA (iron deficiency anemia)    MDD (major depressive disorder)    OA (osteoarthritis)    Right ureteral calculus    Seasonal allergic rhinitis    Type 2 diabetes mellitus (HCC)    followed by pcp    (07-14-2021  per pt checks blood sugar at home 2-3 times weekly,  fasting sugar --120-130)   Urgency of urination    Wears hearing aid in both ears    Past Surgical History:  Procedure Laterality Date   CESAREAN SECTION     x2  last one 1986   CYSTOSCOPY/URETEROSCOPY/HOLMIUM LASER/STENT PLACEMENT  2012   approx   CYSTOSCOPY/URETEROSCOPY/HOLMIUM LASER/STENT PLACEMENT Right 07/20/2021   Procedure: CYSTOSCOPY/RETROGRADE/URETEROSCOPY/HOLMIUM LASER/STENT PLACEMENT;  Surgeon: Jannifer Hick, MD;  Location: [facility];  Service: Urology;  Laterality: Right;  ONLY NEEDS 45 MIN   LAPAROSCOPIC NISSEN  FUNDOPLICATION  02/2020   TOTAL ABDOMINAL HYSTERECTOMY W/ BILATERAL SALPINGOOPHORECTOMY  2002   TOTAL HIP ARTHROPLASTY Right 08/30/2021   Procedure: TOTAL HIP ARTHROPLASTY ANTERIOR APPROACH;  Surgeon: Kathryne Hitch, MD;  Location: [facility];  Service: Orthopedics;  Laterality: Right;   Patient Active Problem List   Diagnosis Date Noted   Pain in left shin 01/19/2023   Chronic constipation 01/19/2023   Acute non-recurrent frontal sinusitis 07/14/2022   Closed right hip fracture (HCC) 08/29/2021   Insomnia 06/24/2021   Candida lusitanea infection 03/16/2021   Chronic kidney disease (CKD) stage G3b/A2, moderately decreased glomerular filtration rate (GFR) between 30-44 mL/min/1.73 square meter and albuminuria creatinine ratio between 30-299 mg/g (HCC) 03/16/2021   Acute midline low back pain without sciatica 02/24/2021   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   Tachycardia 02/05/2021   Itching in the vaginal area 02/05/2021   Generalized anxiety disorder 02/05/2021   Depression, major, single episode, mild (HCC) 02/05/2021   Heart palpitations 02/05/2021   Adhesive capsulitis of right shoulder 02/05/2021   Urinary frequency 02/05/2021   Type 2 diabetes mellitus with hyperglycemia, without long-term current use of insulin (HCC) 02/05/2021   Postoperative hypothyroidism 02/05/2021   Hyperlipidemia associated with type 2 diabetes mellitus (HCC) 02/05/2021   Intractable  migraine without aura and without status migrainosus 02/05/2021   Seasonal allergic rhinitis due to pollen 02/05/2021   Hiatal hernia with GERD 02/27/2020   Lump of right breast 11/28/2019   Angina pectoris (HCC) 10/12/2015   Kidney stone 12/16/2014    PCP: Tollie Eth, NP  REFERRING PROVIDER: Tollie Eth, NP  REFERRING DIAG: M75.01 (ICD-10-CM) - Adhesive capsulitis of right shoulder  THERAPY DIAG:  Stiffness of right shoulder, not elsewhere classified  Right shoulder pain, unspecified chronicity  Muscle  weakness (generalized)  Rationale for Evaluation and Treatment: Rehabilitation  ONSET DATE: MD order 01/17/2023 ; chronic sx's  SUBJECTIVE:                                                                                                                                                                                      SUBJECTIVE STATEMENT: Pt states her shoulder is doing much better and she doesn't notice her shoulder as much now.    reports she was able to carry 4 grocery bags in her hand.  Pt states she feels better with her aquatic exercises.  Pt is performing water aerobics 3 times per week and sometimes 4 times per week.  She did have some pain after her aquatic class though is not having that pain now.  Pt states she is not having pain. Pt states she is improving with reaching and is less apprehensive with reaching.   Pt reports she has been performing her HEP though has forgotten the wand ER exercise.   Pt denies any adverse effects after prior Rx.  Pt states she is able to perform dressing without significant pain and difficulty.  Pt able to don/doff bra.   Hand dominance: Right  PERTINENT HISTORY: DM type 2 R THA in 2022 depression Anemia   PAIN:  NPRS:  0/10 current and best, 1-2/10 worst Location:  R shoulder  PRECAUTIONS: Other: R THA  WEIGHT BEARING RESTRICTIONS: No  FALLS:  Has patient fallen in last 6 months? No  LIVING ENVIRONMENT: Lives with: lives alone   OCCUPATION: Pt is retired  PLOF: Independent.  Pt has had chronic stiffness in shoulder which has affected reaching and daily activities.   PATIENT GOALS:to be able to reach normally and to do the same activities on R that she does with L  NEXT MD VISIT:   OBJECTIVE:     TODAY'S TREATMENT:  Therapeutic Exercise:  Therapeutic Exercise:   AROM Prior/Current:    ER:  40 / 35 IR:  58  / 62            Flexion:  130 / 137                    Scaption:  132 deg  /  141             Abduction:  107 / 110      PROM: Flex:  153 ER:  47 deg   Shoulder strength:      Flex:  5/5    Scaption:  5/5    Abd:  5/5 w/n available range    ER:  4+/5    IR:  5/5   FOTO:  Initial/Current:  63/72.  Goal of 67.   PT reviewed and updated HEP.  Pt performed: Seated wand ER x 15 Supine wand ER x 15 Anterior capsule stretch in doorway 3x10-15 sec  Standing wand IR x10 Standing hz add x 10 Standing rows with retraction with GTB 2x10 Standing ext with retraction with RTB and GTB x 10 reach Standing flexion 2# 2x10 Standing scaption 2# 2x10     PATIENT EDUCATION: Education details:  HEP, POC, rationale of exercises, exercise form, and relevant anatomy.  PT instructed pt to continue with HEP. Person educated: Patient Education method: Explanation, Demonstration, Tactile cues, Verbal cues Education comprehension:  verbalized understanding, returned demonstration, verbal cues required, tactile cues required  HOME EXERCISE PROGRAM: Access Code: [email or web address] URL: [email or web address] Date: 02/15/2023 Prepared by: Aaron Edelman  Updated HEP: - Single Arm Doorway Pec Stretch at 90 Degrees Abduction  - 1 x daily - 7 x weekly - 3 reps - 10-15 seconds hold - Shoulder extension with resistance - Neutral  - 1 x daily - 4 x weekly - 2-3 sets - 10 reps - Standing Shoulder Flexion with Dumbbells  - 1 x daily - 2-3 x weekly - 2-3 sets - 10 reps   ASSESSMENT:  CLINICAL IMPRESSION: Pt is making good progress in pain and function as evidenced by subjective reports.  She reports much improved pain and is not having pain after aquatic class.  She has improved with performance of reaching activities and also having less pain with reaching.  Pt states she is able to perform dressing without significant pain and difficulty.  She has improved with ROM though  continues to have tightness and limitations with shoulder ROM especially ER.  Pt has worse ER ROM today compared to prior testing.  PT educated pt in home exercises to perform to improve ER ROM.  PT instructed pt in correct form and positioning of ER AAROM exercises.  Pt has good strength t/o R shoulder with slight weakness in ER.  though has made progress overall.  Pt has met all goals except partially meeting ROM goals (STG #2 and LTG #4).  Pt should benefit from 1 more visit to review and update HEP as needed to ensure proper performance and understanding of correct exercises.     OBJECTIVE IMPAIRMENTS: decreased ROM, decreased strength, hypomobility, impaired flexibility, impaired UE functional use, and pain.   ACTIVITY LIMITATIONS: lifting, dressing, and reach over head  PARTICIPATION LIMITATIONS: cleaning  PERSONAL FACTORS: Time since onset of injury/illness/exacerbation and 1-2 comorbidities: DM type 2 and anemia  are also affecting patient's functional outcome.   REHAB POTENTIAL: Good  CLINICAL DECISION MAKING: Stable/uncomplicated  EVALUATION COMPLEXITY: Low   GOALS:  SHORT TERM GOALS: Target date: 03/08/2023    Pt will be independent and compliant with HEP for improved ROM, strength, pain, and function. Baseline: Goal status: MET 6/22  2.  Pt will demo at least a 10 deg increase in flexion, scaption, abd, ER, and IR AROM for improved stiffness and reaching.  Baseline:  Goal status: 80% MET 7/31  3.  Pt will report at least a 25% improvement in reaching activities.  Baseline:  Goal status: MET 6/22    LONG TERM GOALS: Target date: 05/03/2023  Pt will report increased usage of R UE with household cleaning and reaching into cabinets and decreased compensatory usage of L UE.  Baseline:  Goal status: MET 6/22  2.  Pt will report she is reaching into overhead cabinets with R UE without difficulty.   Baseline:  Goal status: GOAL MET  7/31  3.  Pt will report she is  able to dress without significant pain and difficulty.  Baseline:  Goal status:  GOAL MET  7/31  4.  Pt will demo improved R shoulder AROM to at least 140 deg in flexion and scaption, 60 deg in ER, and 55 deg in IR for improved performance of ADLs/IADLs and reaching activities.  Baseline:  Goal status: 50% MET 7/31 Target date: 05/11/2023   PLAN:  PT FREQUENCY: 1 visit  PT DURATION: 1 week  PLANNED INTERVENTIONS: Therapeutic exercises, Therapeutic activity, Neuromuscular re-education, Patient/Family education, Self Care, Joint mobilization, Aquatic Therapy, Dry Needling, Electrical stimulation, Spinal mobilization, Cryotherapy, Moist heat, Taping, Ultrasound, Manual therapy, and Re-evaluation  PLAN FOR NEXT SESSION:  1 more visit then discharge.  Review HEP and update HEP as needed.   Audie Clear III PT, DPT 05/11/23 4:23 PM

## 2023-05-12 ENCOUNTER — Encounter (HOSPITAL_BASED_OUTPATIENT_CLINIC_OR_DEPARTMENT_OTHER): Payer: Self-pay | Admitting: Physical Therapy

## 2023-05-27 ENCOUNTER — Encounter: Payer: Self-pay | Admitting: Gastroenterology

## 2023-06-01 ENCOUNTER — Ambulatory Visit (INDEPENDENT_AMBULATORY_CARE_PROVIDER_SITE_OTHER): Payer: HMO | Admitting: Gastroenterology

## 2023-06-01 ENCOUNTER — Encounter: Payer: Self-pay | Admitting: Gastroenterology

## 2023-06-01 VITALS — BP 120/68 | HR 96 | Ht 65.0 in | Wt 147.0 lb

## 2023-06-01 DIAGNOSIS — K219 Gastro-esophageal reflux disease without esophagitis: Secondary | ICD-10-CM

## 2023-06-01 DIAGNOSIS — K208 Other esophagitis without bleeding: Secondary | ICD-10-CM | POA: Diagnosis not present

## 2023-06-01 DIAGNOSIS — K259 Gastric ulcer, unspecified as acute or chronic, without hemorrhage or perforation: Secondary | ICD-10-CM | POA: Diagnosis not present

## 2023-06-01 HISTORY — DX: Other esophagitis without bleeding: K20.80

## 2023-06-01 NOTE — Progress Notes (Signed)
Agree with assessment/plan.  Raj Gupta, MD [facility] [phone]  

## 2023-06-01 NOTE — Progress Notes (Signed)
06/01/2023 ELLISHA OLLIVER [id number] 25-Jan-1956   HISTORY OF PRESENT ILLNESS: This is a 67 year old female who is a patient of Dr. Urban Gibson.  She is here for follow-up after her endoscopy for her reflux symptoms.  She is now on pantoprazole 40 mg twice daily for 8 weeks and then will be reducing down to once daily soon.  Since starting the medication she has not needed any type of Tums only on a couple of occasions over the last 8 weeks.  Is scheduled for repeat EGD on 10/1 to assess healing.  - LA Grade C reflux esophagitis with no bleeding. Biopsied. - 3 cm hiatal hernia. Slipped Nissen's fundoplication. - Non-bleeding gastric ulcer with no stigmata of bleeding. Biopsied. - Normal examined duodenum. Biopsied.  Colonoscopy 03/2013:  - One 6 mm polyp in the mid transverse colon, removed with a cold snare. Resected and retrieved. 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 Biopsied. R/O other causes. - Moderate pancolonic diverticulosis predominantly in the right colon.  1. Surgical [P], small bowel bxs - BENIGN SMALL BOWEL MUCOSA WITH NO SIGNIFICANT PATHOLOGIC CHANGES 2. Surgical [P], gastric ulcer bxs - GASTRIC MUCOSA WITH FEATURES OF REACTIVE GASTROPATHY - NEGATIVE FOR H. PYLORI ON H&E STAIN - NEGATIVE FOR INTESTINAL METAPLASIA OR MALIGNANCY 3. Surgical [P], distal esophagus - BENIGN SQUAMOUS AND GLANDULAR MUCOSA WITH REACTIVE CHANGES - NEGATIVE FOR INTESTINAL METAPLASIA, DYSPLASIA OR MALIGNANCY 4. Surgical [P], colon, transverse, polyp (1) - TUBULAR ADENOMA. - NO HIGH GRADE DYSPLASIA OR MALIGNANCY. 5. Surgical [P], colon, rectum ulcer ( at rectal prolapse likely solitary rectal ulcer) - COLONIC MUCOSA WITH PROLAPSE TYPE CHANGE AND MILD ACUTE INFLAMMATION, CONSISTENT WITH SOLITARY RECTAL ULCER   Past Medical History:  Diagnosis Date   Complication of anesthesia    Constipation    GAD  (generalized anxiety disorder)    Heart murmur    Heart palpitations    evaluated by cardiologist--- dr h. Shari Prows, note in epic 04-03-2021, normal echo and monitor showed NSR w/ SVT   History of COVID-19    per pt had covid twice in 12/ 2020 with mild symptoms with residual intermittant loss of taste;  and 04/ 2022 with mild symptoms that resolved   History of hyperthyroidism    age 24 s/p RAI   History of kidney stones    History of recurrent UTIs    History of repair of hiatal hernia 02/2020   Hyperlipidemia    Hypothyroidism, postablative    age 61  s/p RAI for hyperthyroidism;  followed by pcp   IDA (iron deficiency anemia)    MDD (major depressive disorder)    OA (osteoarthritis)    Right ureteral calculus    Seasonal allergic rhinitis    Type 2 diabetes mellitus (HCC)    followed by pcp    (07-14-2021  per pt checks blood sugar at home 2-3 times weekly,  fasting sugar --120-130)   Urgency of urination    Wears hearing aid in both ears    Past Surgical History:  Procedure Laterality Date   CESAREAN SECTION     x2  last one 1986   CYSTOSCOPY/URETEROSCOPY/HOLMIUM LASER/STENT PLACEMENT  2012   approx   CYSTOSCOPY/URETEROSCOPY/HOLMIUM LASER/STENT PLACEMENT Right 07/20/2021   Procedure: CYSTOSCOPY/RETROGRADE/URETEROSCOPY/HOLMIUM LASER/STENT PLACEMENT;  Surgeon: Jannifer Hick, MD;  Location: [facility];  Service: Urology;  Laterality: Right;  ONLY NEEDS 45 MIN   LAPAROSCOPIC NISSEN FUNDOPLICATION  02/2020   TOTAL ABDOMINAL HYSTERECTOMY W/ BILATERAL SALPINGOOPHORECTOMY  2002   TOTAL HIP ARTHROPLASTY Right 08/30/2021   Procedure: TOTAL HIP ARTHROPLASTY ANTERIOR APPROACH;  Surgeon: Kathryne Hitch, MD;  Location: [facility];  Service: Orthopedics;  Laterality: Right;    reports that she quit smoking about 36 years ago. Her smoking use included cigarettes. She started smoking about 46 years ago. She has a 5 pack-year smoking history. She has never used smokeless  tobacco. She reports that she does not drink alcohol and does not use drugs. family history includes Diabetes in her father and mother. Allergies  Allergen Reactions   Curare [Tubocurarine] Other (See Comments)    Per pt was awake and aware that she had paralysis and could not breath      Outpatient Encounter Medications as of 06/01/2023  Medication Sig   Alpha-Lipoic Acid 600 MG TABS Take 1 tablet by mouth every evening.   ALPRAZolam (XANAX) 0.25 MG tablet Take 1 tablet (0.25 mg total) by mouth 2 (two) times daily as needed for anxiety. (Patient not taking: Reported on 02/25/2023)   Ascorbic Acid (VITAMIN C) 1000 MG tablet Take 1,000 mg by mouth daily.   atorvastatin (LIPITOR) 40 MG tablet TAKE 1 TABLET(40 MG) BY MOUTH AT BEDTIME   azelastine (ASTELIN) 0.1 % nasal spray Place 2 sprays into both nostrils 2 (two) times daily.   Blood Glucose Monitoring Suppl DEVI 1 each by Does not apply route in the morning, at noon, and at bedtime. May substitute to any manufacturer covered by patient's insurance.   cetirizine (ZYRTEC) 10 MG tablet Take 10 mg by mouth daily.   cholecalciferol (VITAMIN D3) 25 MCG (1000 UNIT) tablet Take 1,000 Units by mouth daily.   docusate sodium (COLACE) 100 MG capsule Take 200 mg by mouth daily.   fluticasone (FLONASE) 50 MCG/ACT nasal spray Place 2 sprays into both nostrils daily.   Lancets (ONETOUCH DELICA PLUS LANCET30G) MISC    levothyroxine (SYNTHROID) 150 MCG tablet TAKE 1 TABLET(150 MCG) BY MOUTH DAILY BEFORE BREAKFAST   Magnesium 250 MG TABS Take 1 tablet by mouth daily.   metFORMIN (GLUCOPHAGE) 500 MG tablet TAKE 1 AND 1/2 TABLETS BY MOUTH DAILY, WITH BREAKFAST, AND 1 TABLET EVERY EVENING   Misc Natural Products (GLUCOSAMINE CHOND CMP ADVANCED PO) Take 2 capsules by mouth every evening.   nortriptyline (PAMELOR) 50 MG capsule TAKE 1 CAPSULE(50 MG) BY MOUTH AT BEDTIME   Omega-3 Fatty Acids (OMEGA-3 FISH OIL PO) Take 2,000 capsules by mouth every evening.    ONETOUCH ULTRA test strip    pantoprazole (PROTONIX) 40 MG tablet Take 1 tablet (40 mg total) by mouth 2 (two) times daily. Two times  daily for 8 weeks then once daily.   Probiotic Product (PROBIOTIC DAILY) CAPS Take 1 capsule by mouth every evening.   sertraline (ZOLOFT) 50 MG tablet TAKE 1 TABLET(50 MG) BY MOUTH DAILY   trimethoprim (TRIMPEX) 100 MG tablet TAKE 1 TABLET BY MOUTH EVERYDAY AT BEDTIME   Turmeric 500 MG CAPS Take 1 capsule by mouth every evening.   [DISCONTINUED] aspirin 81 MG chewable tablet Chew 1 tablet (81 mg total) by mouth 2 (two) times daily. (Patient taking differently: Chew 81 mg by mouth daily.)   Facility-Administered Encounter Medications as of 06/01/2023  Medication   0.9 %  sodium chloride infusion    REVIEW OF SYSTEMS  : All other systems reviewed and negative except where noted in the History of Present Illness.   PHYSICAL EXAM: Ht 5'  5" (1.651 m)   Wt 147 lb (66.7 kg)   BMI 24.46 kg/m  General: Well developed white female in no acute distress Head: Normocephalic and atraumatic Eyes:  Sclerae anicteric, conjunctiva pink. Ears: Normal auditory acuity Lungs: Clear throughout to auscultation; no W/R/R. Heart: Regular rate and rhythm; no M/R/G. Musculoskeletal: Symmetrical with no gross deformities  Skin: No lesions on visible extremities Extremities: No edema  Neurological: Alert oriented x 4, grossly non-focal Psychological:  Alert and cooperative. Normal mood and affect  ASSESSMENT AND PLAN: *GERD LA grade C esophagitis and gastric ulcer: Was not on any PPI therapy.  Now on pantoprazole 40 mg twice daily x 8 weeks and then will go down to once daily.  Repeat EGD recommended in 8 to 12 weeks.  This is already scheduled on 10/1.  Symptoms are improved and she has only needed to take Tums a couple of times since beginning the Protonix.  She has a slipped Nissen fundoplication.  Question if she can be reconsidered for a repeat or other surgical intervention  if she desires.   CC:  Early, Sung Amabile, NP

## 2023-06-01 NOTE — Patient Instructions (Signed)
You have been scheduled for an endoscopy. Please follow written instructions given to you at your visit today.  If you use inhalers (even only as needed), please bring them with you on the day of your procedure.  If you take any of the following medications, they will need to be adjusted prior to your procedure:   DO NOT TAKE 7 DAYS PRIOR TO TEST- Trulicity (dulaglutide) Ozempic, Wegovy (semaglutide) Mounjaro (tirzepatide) Bydureon Bcise (exanatide extended release)  DO NOT TAKE 1 DAY PRIOR TO YOUR TEST Rybelsus (semaglutide) Adlyxin (lixisenatide) Victoza (liraglutide) Byetta (exanatide) _______________________________________________________  If your blood pressure at your visit was 140/90 or greater, please contact your primary care physician to follow up on this.  _______________________________________________________  If you are age 69 or older, your body mass index should be between 23-30. Your Body mass index is 24.46 kg/m. If this is out of the aforementioned range listed, please consider follow up with your Primary Care Provider.  If you are age 16 or younger, your body mass index should be between 19-25. Your Body mass index is 24.46 kg/m. If this is out of the aformentioned range listed, please consider follow up with your Primary Care Provider.   ________________________________________________________  The [facility] providers would like to encourage you to use [facility] to communicate with providers for non-urgent requests or questions.  Due to long hold times on the telephone, sending your provider a message by [facility] may be a faster and more efficient way to get a response.  Please allow 48 business hours for a response.  Please remember that this is for non-urgent requests.  _______________________________________________________

## 2023-06-20 ENCOUNTER — Ambulatory Visit (INDEPENDENT_AMBULATORY_CARE_PROVIDER_SITE_OTHER): Payer: HMO | Admitting: Nurse Practitioner

## 2023-06-20 ENCOUNTER — Encounter: Payer: Self-pay | Admitting: Nurse Practitioner

## 2023-06-20 VITALS — BP 122/78 | HR 98 | Wt 148.2 lb

## 2023-06-20 DIAGNOSIS — E890 Postprocedural hypothyroidism: Secondary | ICD-10-CM | POA: Diagnosis not present

## 2023-06-20 DIAGNOSIS — E1169 Type 2 diabetes mellitus with other specified complication: Secondary | ICD-10-CM

## 2023-06-20 DIAGNOSIS — Z23 Encounter for immunization: Secondary | ICD-10-CM

## 2023-06-20 DIAGNOSIS — E1165 Type 2 diabetes mellitus with hyperglycemia: Secondary | ICD-10-CM | POA: Diagnosis not present

## 2023-06-20 DIAGNOSIS — E785 Hyperlipidemia, unspecified: Secondary | ICD-10-CM | POA: Diagnosis not present

## 2023-06-20 DIAGNOSIS — N1832 Chronic kidney disease, stage 3b: Secondary | ICD-10-CM | POA: Diagnosis not present

## 2023-06-20 DIAGNOSIS — M65331 Trigger finger, right middle finger: Secondary | ICD-10-CM | POA: Diagnosis not present

## 2023-06-20 NOTE — Progress Notes (Signed)
Tollie Eth, DNP, AGNP-c [facility] [address] [place], [place] [address] [phone]   ACUTE VISIT- ESTABLISHED PATIENT  Blood pressure 122/78, pulse 98, weight 148 lb 3.2 oz (67.2 kg).  Subjective:  HPI Bianca Phillips is a 67 y.o. female presents to day for evaluation of acute concern(s).   Reita reports pain in the right hand middle finger pain on palm of the hand proximal to the PIP joint along the tendon. She tells me this has been present for about a month. It has progressed to where she cannot bend the finger completely. She does not recall any repetitive use of the hand prior to the onset of the symptoms. She has not had anything like this happen in the past. She does feel her grip is affected.   She reports that she has stopped the iron supplements.   ROS negative except for what is listed in HPI. History, Medications, Surgery, SDOH, and Family History reviewed and updated as appropriate.  Objective:  Physical Exam Vitals and nursing note reviewed.  Constitutional:      Appearance: Normal appearance.  Pulmonary:     Effort: Pulmonary effort is normal.  Musculoskeletal:     Right hand: Tenderness present. No deformity. Decreased range of motion. Decreased strength. Normal sensation. There is no disruption of two-point discrimination. Normal capillary refill. Normal pulse.     Left hand: Normal.     Comments: Pain along the tendon of the middle finger on the right hand. Suspicious of trigger finger.   Skin:    General: Skin is warm and dry.     Capillary Refill: Capillary refill takes less than 2 seconds.  Neurological:     Mental Status: She is alert and oriented to person, place, and time.     Sensory: No sensory deficit.         Assessment & Plan:   Problem List Items Addressed This Visit     Type 2 diabetes mellitus with hyperglycemia, without long-term current use of insulin (HCC)    Monitor labs today      Relevant  Medications   aspirin EC 81 MG tablet   Other Relevant Orders   Hemoglobin A1c   CBC with Differential/Platelet   Comprehensive metabolic panel   Iron, TIBC and Ferritin Panel   LP+LDL Direct   TSH   T4, free   Microalbumin / creatinine urine ratio   Postoperative hypothyroidism    Monitor labs today      Relevant Orders   Hemoglobin A1c   CBC with Differential/Platelet   Comprehensive metabolic panel   Iron, TIBC and Ferritin Panel   LP+LDL Direct   TSH   T4, free   Hyperlipidemia associated with type 2 diabetes mellitus (HCC)    Monitor labs today      Relevant Medications   aspirin EC 81 MG tablet   Other Relevant Orders   Hemoglobin A1c   CBC with Differential/Platelet   Comprehensive metabolic panel   Iron, TIBC and Ferritin Panel   LP+LDL Direct   TSH   T4, free   Chronic kidney disease (CKD) stage G3b/A2, moderately decreased glomerular filtration rate (GFR) between 30-44 mL/min/1.73 square meter and albuminuria creatinine ratio between 30-299 mg/g (HCC)    Monitor labs today      Relevant Orders   Hemoglobin A1c   CBC with Differential/Platelet   Comprehensive metabolic panel   Iron, TIBC and Ferritin Panel   LP+LDL Direct   TSH   T4,  free   Trigger finger, right middle finger - Primary    Symptoms and presentation consistent with trigger finger. We discussed treatment options that may be helpful including splinting and ortho evaluation for injections. At this time she would like to trial splinting/stabilization to see if this is helpful. We discussed the type of splint that would be effective, but still allow her to use her hand. If this is not effective, she will let me know and we can place a referral to sports medicine for her.       Other Visit Diagnoses     Need for influenza vaccination       Relevant Orders   Flu Vaccine Trivalent High Dose (Fluad) (Completed)       Tollie Eth, DNP, AGNP-c

## 2023-06-20 NOTE — Patient Instructions (Addendum)
Look on [employer] for "Trigger Finger Splint for Middle Finger."  Voltaren Gel (diclofenac gel) over the counter can be helpful to reduce inflammation.   Trigger Finger  Trigger finger, also called stenosing tenosynovitis, is a condition that causes a finger or thumb to get stuck in a bent position. Each finger has tough, cord-like tissue (tendon) that connects muscle to bone, and each tendon passes through a tunnel of tissue (tendon sheath). The tendon sheaths are held close to the bone by a pulley. There is a pulley called the A1 pulley that is involved in the triggering of a finger or thumb. To move your finger, your tendon needs to glide freely through the sheath. Trigger finger happens when the tendon or the sheath thickens, making it difficult to bend or straighten your finger as the thickened tendon gets stuck in the A1 pulley. Trigger finger can affect any of the fingers or the thumbs. Mild cases may clear up with rest and medicine. Severe cases require more treatment. What are the causes? Trigger finger or thumb is caused by a thickened finger tendon or tendon sheath. The cause of this thickening is not known. What increases the risk? The following factors may make you more likely to develop this condition: Doing the same movements many times (repetitive activity) that require a strong grip. Having certain health conditions. These include rheumatoid arthritis, gout, carpal tunnel syndrome, or diabetes. Being 24-53 years old. Being female. What are the signs or symptoms? Symptoms of this condition include: Pain when bending or straightening your finger. Tenderness, swelling, or a lump in the palm of your hand just below the finger joint. Hearing a noise like a pop or a snap when you try to straighten your finger. Feeling a catch or locked feeling when you try to straighten your finger. Being unable to straighten your finger without help from your other hand. How is this diagnosed? This  condition is diagnosed based on your symptoms and a physical exam. How is this treated? This condition may be treated by: Resting your finger and avoiding activities that make symptoms worse. Wearing a finger splint to keep your finger extended. Taking NSAIDs, such as ibuprofen, to relieve pain and swelling around the tendon. Doing gentle exercises to stretch the finger as told by your health care provider. Having medicine that reduces swelling and inflammation (steroids) injected into the tendon sheath. Injections may need to be repeated. Trigger finger release. This surgery is done to open the pulley. This may be done if other treatments do not work and you cannot straighten or bend your finger. You may need hand therapy after surgery. Follow these instructions at home: If you have a removable splint: Wear the splint as told by your provider. Remove it only as told by your provider. Check the skin around the splint every day. Tell your provider about any concerns. Loosen the splint if your fingers tingle, become numb, or turn cold and blue. Keep the splint clean and dry. If the splint is not waterproof: Do not let it get wet. Cover it with a watertight covering when you take a bath or shower. Managing pain, stiffness, and swelling     If told, put ice on the painful area. If you have a removable splint, remove it as told by your health care provider. Put ice in a plastic bag. Place a towel between your skin and the bag or between your splint and the bag. Leave the ice on for 20 minutes, 2-3 times a  day. If told, apply heat to the affected area as often as told by your provider. Use the heat source that your provider recommends, such as a moist heat pack or a heating pad. Place a towel between your skin and the heat source. Leave the heat on for 20-30 minutes. If your skin turns bright red, remove the ice or heat right away to prevent skin damage. The risk of damage is higher if you  cannot feel pain, heat, or cold. Activity Rest your finger as told by your provider. Avoid activities that make the pain worse. Return to your normal activities as told by your provider. Ask your provider what activities are safe for you. Do exercises as told by your provider. Ask your provider when it is safe to drive if you have a splint on your hand. General instructions Take over-the-counter and prescription medicines only as told by your provider. Keep all follow-up visits. These are needed to see how you are progressing. Contact a health care provider if: Your symptoms are not improving with home care. This information is not intended to replace advice given to you by your health care provider. Make sure you discuss any questions you have with your health care provider. Document Revised: 05/07/2022 Document Reviewed: 05/07/2022 Elsevier Patient Education  2024 [employer].

## 2023-06-21 ENCOUNTER — Encounter: Payer: Self-pay | Admitting: Gastroenterology

## 2023-06-21 ENCOUNTER — Other Ambulatory Visit: Payer: HMO

## 2023-06-22 ENCOUNTER — Ambulatory Visit
Admission: RE | Admit: 2023-06-22 | Discharge: 2023-06-22 | Disposition: A | Discharge status: Home / Self Care | Payer: Self-pay | Source: Ambulatory Visit | Attending: Nurse Practitioner

## 2023-06-22 DIAGNOSIS — M8588 Other specified disorders of bone density and structure, other site: Secondary | ICD-10-CM | POA: Diagnosis not present

## 2023-06-22 DIAGNOSIS — Z13820 Encounter for screening for osteoporosis: Secondary | ICD-10-CM

## 2023-06-22 DIAGNOSIS — Z90722 Acquired absence of ovaries, bilateral: Secondary | ICD-10-CM | POA: Diagnosis not present

## 2023-06-22 DIAGNOSIS — E349 Endocrine disorder, unspecified: Secondary | ICD-10-CM | POA: Diagnosis not present

## 2023-06-23 ENCOUNTER — Other Ambulatory Visit: Payer: HMO

## 2023-06-24 ENCOUNTER — Other Ambulatory Visit: Payer: HMO

## 2023-06-24 DIAGNOSIS — E785 Hyperlipidemia, unspecified: Secondary | ICD-10-CM | POA: Diagnosis not present

## 2023-06-24 DIAGNOSIS — N1832 Chronic kidney disease, stage 3b: Secondary | ICD-10-CM | POA: Diagnosis not present

## 2023-06-24 DIAGNOSIS — E1169 Type 2 diabetes mellitus with other specified complication: Secondary | ICD-10-CM | POA: Diagnosis not present

## 2023-06-24 DIAGNOSIS — E1165 Type 2 diabetes mellitus with hyperglycemia: Secondary | ICD-10-CM | POA: Diagnosis not present

## 2023-06-24 DIAGNOSIS — E890 Postprocedural hypothyroidism: Secondary | ICD-10-CM | POA: Diagnosis not present

## 2023-06-24 DIAGNOSIS — M65331 Trigger finger, right middle finger: Secondary | ICD-10-CM | POA: Insufficient documentation

## 2023-06-24 MED ORDER — ASPIRIN 81 MG PO TBEC: 81.0000 mg | DELAYED_RELEASE_TABLET | Freq: Every day | ORAL | Status: DC

## 2023-06-24 NOTE — Assessment & Plan Note (Signed)
Symptoms and presentation consistent with trigger finger. We discussed treatment options that may be helpful including splinting and ortho evaluation for injections. At this time she would like to trial splinting/stabilization to see if this is helpful. We discussed the type of splint that would be effective, but still allow her to use her hand. If this is not effective, she will let me know and we can place a referral to sports medicine for her.

## 2023-06-24 NOTE — Assessment & Plan Note (Signed)
Monitor labs today.

## 2023-06-25 LAB — CBC WITH DIFFERENTIAL/PLATELET
Basophils Absolute: 0 10*3/uL (ref 0.0–0.2)
Basos: 1 %
EOS (ABSOLUTE): 0.2 10*3/uL (ref 0.0–0.4)
Eos: 3 %
Hematocrit: 42.8 % (ref 34.0–46.6)
Hemoglobin: 13.5 g/dL (ref 11.1–15.9)
Immature Grans (Abs): 0 10*3/uL (ref 0.0–0.1)
Immature Granulocytes: 0 %
Lymphocytes Absolute: 2.1 10*3/uL (ref 0.7–3.1)
Lymphs: 42 %
MCH: 29.5 pg (ref 26.6–33.0)
MCHC: 31.5 g/dL (ref 31.5–35.7)
MCV: 93 fL (ref 79–97)
Monocytes Absolute: 0.4 10*3/uL (ref 0.1–0.9)
Monocytes: 8 %
Neutrophils Absolute: 2.3 10*3/uL (ref 1.4–7.0)
Neutrophils: 46 %
Platelets: 247 10*3/uL (ref 150–450)
RBC: 4.58 x10E6/uL (ref 3.77–5.28)
RDW: 12.5 % (ref 11.7–15.4)
WBC: 5 10*3/uL (ref 3.4–10.8)

## 2023-06-25 LAB — COMPREHENSIVE METABOLIC PANEL
ALT: 22 IU/L (ref 0–32)
AST: 21 IU/L (ref 0–40)
Albumin: 4.4 g/dL (ref 3.9–4.9)
Alkaline Phosphatase: 113 IU/L (ref 44–121)
BUN/Creatinine Ratio: 18 (ref 12–28)
BUN: 20 mg/dL (ref 8–27)
Bilirubin Total: 0.4 mg/dL (ref 0.0–1.2)
CO2: 23 mmol/L (ref 20–29)
Calcium: 9.4 mg/dL (ref 8.7–10.3)
Chloride: 101 mmol/L (ref 96–106)
Creatinine, Ser: 1.12 mg/dL — ABNORMAL HIGH (ref 0.57–1.00)
Globulin, Total: 2.2 g/dL (ref 1.5–4.5)
Glucose: 155 mg/dL — ABNORMAL HIGH (ref 70–99)
Potassium: 4.5 mmol/L (ref 3.5–5.2)
Sodium: 138 mmol/L (ref 134–144)
Total Protein: 6.6 g/dL (ref 6.0–8.5)
eGFR: 53 mL/min/{1.73_m2} — ABNORMAL LOW (ref >59)

## 2023-06-25 LAB — LP+LDL DIRECT
Cholesterol, Total: 174 mg/dL (ref 100–199)
HDL: 37 mg/dL — ABNORMAL LOW (ref >39)
LDL Chol Calc (NIH): 93 mg/dL (ref 0–99)
LDL Direct: 81 mg/dL (ref 0–99)
Triglycerides: 263 mg/dL — ABNORMAL HIGH (ref 0–149)
VLDL Cholesterol Cal: 44 mg/dL — ABNORMAL HIGH (ref 5–40)

## 2023-06-25 LAB — TSH: TSH: 4.85 u[IU]/mL — ABNORMAL HIGH (ref 0.450–4.500)

## 2023-06-25 LAB — T4, FREE: Free T4: 3.16 ng/dL — ABNORMAL HIGH (ref 0.82–1.77)

## 2023-06-25 LAB — IRON,TIBC AND FERRITIN PANEL
Ferritin: 18 ng/mL (ref 15–150)
Iron Saturation: 28 % (ref 15–55)
Iron: 94 ug/dL (ref 27–139)
Total Iron Binding Capacity: 338 ug/dL (ref 250–450)
UIBC: 244 ug/dL (ref 118–369)

## 2023-06-25 LAB — MICROALBUMIN / CREATININE URINE RATIO
Creatinine, Urine: 107 mg/dL
Microalb/Creat Ratio: 14 mg/g creat (ref 0–29)
Microalbumin, Urine: 14.9 ug/mL

## 2023-06-25 LAB — HEMOGLOBIN A1C
Est. average glucose Bld gHb Est-mCnc: 146 mg/dL
Hgb A1c MFr Bld: 6.7 % — ABNORMAL HIGH (ref 4.8–5.6)

## 2023-06-28 ENCOUNTER — Other Ambulatory Visit: Payer: Self-pay

## 2023-06-28 ENCOUNTER — Encounter: Payer: Self-pay | Admitting: Nurse Practitioner

## 2023-06-28 DIAGNOSIS — E782 Mixed hyperlipidemia: Secondary | ICD-10-CM

## 2023-06-28 DIAGNOSIS — E1165 Type 2 diabetes mellitus with hyperglycemia: Secondary | ICD-10-CM

## 2023-06-28 DIAGNOSIS — R7989 Other specified abnormal findings of blood chemistry: Secondary | ICD-10-CM

## 2023-06-28 MED ORDER — ATORVASTATIN CALCIUM 40 MG PO TABS: ORAL_TABLET | ORAL | 1 refills | Status: DC

## 2023-07-04 ENCOUNTER — Ambulatory Visit: Payer: HMO | Admitting: Gastroenterology

## 2023-07-05 ENCOUNTER — Encounter: Payer: Self-pay | Admitting: Gastroenterology

## 2023-07-05 ENCOUNTER — Ambulatory Visit: Payer: HMO | Admitting: Gastroenterology

## 2023-07-05 VITALS — BP 133/67 | HR 84 | Temp 98.2°F | Resp 20 | Ht 65.0 in | Wt 147.0 lb

## 2023-07-05 DIAGNOSIS — F411 Generalized anxiety disorder: Secondary | ICD-10-CM | POA: Diagnosis not present

## 2023-07-05 DIAGNOSIS — K222 Esophageal obstruction: Secondary | ICD-10-CM

## 2023-07-05 DIAGNOSIS — K219 Gastro-esophageal reflux disease without esophagitis: Secondary | ICD-10-CM

## 2023-07-05 DIAGNOSIS — K449 Diaphragmatic hernia without obstruction or gangrene: Secondary | ICD-10-CM | POA: Diagnosis not present

## 2023-07-05 DIAGNOSIS — K3189 Other diseases of stomach and duodenum: Secondary | ICD-10-CM | POA: Diagnosis not present

## 2023-07-05 DIAGNOSIS — K2970 Gastritis, unspecified, without bleeding: Secondary | ICD-10-CM

## 2023-07-05 DIAGNOSIS — F32A Depression, unspecified: Secondary | ICD-10-CM | POA: Diagnosis not present

## 2023-07-05 DIAGNOSIS — E119 Type 2 diabetes mellitus without complications: Secondary | ICD-10-CM | POA: Diagnosis not present

## 2023-07-05 MED ORDER — PANTOPRAZOLE SODIUM 40 MG PO TBEC: 40.0000 mg | DELAYED_RELEASE_TABLET | Freq: Every day | ORAL | 3 refills | Status: DC

## 2023-07-05 MED ORDER — SODIUM CHLORIDE 0.9 % IV SOLN: 500.0000 mL | Freq: Once | INTRAVENOUS | Status: DC

## 2023-07-05 NOTE — Patient Instructions (Addendum)
YOU HAD AN ENDOSCOPIC PROCEDURE TODAY AT THE [facility]:   Refer to the procedure report that was given to you for any specific questions about what was found during the examination.  If the procedure report does not answer your questions, please call your gastroenterologist to clarify.  If you requested that your care partner not be given the details of your procedure findings, then the procedure report has been included in a sealed envelope for you to review at your convenience later.  YOU SHOULD EXPECT: Some feelings of bloating in the abdomen. Passage of more gas than usual.  Walking can help get rid of the air that was put into your GI tract during the procedure and reduce the bloating. If you had a lower endoscopy (such as a colonoscopy or flexible sigmoidoscopy) you may notice spotting of blood in your stool or on the toilet paper. If you underwent a bowel prep for your procedure, you may not have a normal bowel movement for a few days.  Please Note:  You might notice some irritation and congestion in your nose or some drainage.  This is from the oxygen used during your procedure.  There is no need for concern and it should clear up in a day or so.  SYMPTOMS TO REPORT IMMEDIATELY:   Following upper endoscopy (EGD)  Vomiting of blood or coffee ground material  New chest pain or pain under the shoulder blades  Painful or persistently difficult swallowing  New shortness of breath  Fever of 100F or higher  Black, tarry-looking stools  For urgent or emergent issues, a gastroenterologist can be reached at any hour by calling [phone]. Do not use MyChart messaging for urgent concerns.    DIET:  We do recommend a small meal at first, but then you may proceed to your regular diet.  Drink plenty of fluids but you should avoid alcoholic beverages for 24 hours.  MEDICATIONS: Continue present medications including Protonix 40 mg by mouth daily (new prescription sent to your  pharmacy). You can also try it every other day.  FOLLOW UP: Use Anti - reflux precautions (see handout given to you today). Await pathology results.  Please see handouts given to you by your recovery nurse: Hiatal Hernia with anti-reflux precautions, Gastritis.  Thank you for allowing [place] to provide for your healthcare needs today.  ACTIVITY:  You should plan to take it easy for the rest of today and you should NOT DRIVE or use heavy machinery until tomorrow (because of the sedation medicines used during the test).    FOLLOW UP: Our staff will call the number listed on your records the next business day following your procedure.  We will call around 7:15- 8:00 am to check on you and address any questions or concerns that you may have regarding the information given to you following your procedure. If we do not reach you, we will leave a message.     If any biopsies were taken you will be contacted by phone or by letter within the next 1-3 weeks.  Please call us at [phone] if you have not heard about the biopsies in 3 weeks.    SIGNATURES/CONFIDENTIALITY: You and/or your care partner have signed paperwork which will be entered into your electronic medical record.  These signatures attest to the fact that that the information above on your After Visit Summary has been reviewed and is understood.  Full responsibility of the confidentiality of this discharge information lies with  you and/or your care-partner.

## 2023-07-05 NOTE — Progress Notes (Signed)
06/01/2023 Bianca Phillips [id number] [birth year]     HISTORY OF PRESENT ILLNESS: This is a 67 year old female who is a patient of Dr. Urban Gibson.  She is here for follow-up after her endoscopy for her reflux symptoms.  She is now on pantoprazole 40 mg twice daily for 8 weeks and then will be reducing down to once daily soon.  Since starting the medication she has not needed any type of Tums only on a couple of occasions over the last 8 weeks.  Is scheduled for repeat EGD on 10/1 to assess healing.   - LA Grade C reflux esophagitis with no bleeding. Biopsied. - 3 cm hiatal hernia. Slipped Nissen's fundoplication. - Non-bleeding gastric ulcer with no stigmata of bleeding. Biopsied. - Normal examined duodenum. Biopsied.   Colonoscopy 03/2013:   - One 6 mm polyp in the mid transverse colon, removed with a cold snare. Resected and retrieved. 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 Biopsied. R/O other causes. - Moderate pancolonic diverticulosis predominantly in the right colon.   1. Surgical [P], small bowel bxs - BENIGN SMALL BOWEL MUCOSA WITH NO SIGNIFICANT PATHOLOGIC CHANGES 2. Surgical [P], gastric ulcer bxs - GASTRIC MUCOSA WITH FEATURES OF REACTIVE GASTROPATHY - NEGATIVE FOR H. PYLORI ON H&E STAIN - NEGATIVE FOR INTESTINAL METAPLASIA OR MALIGNANCY 3. Surgical [P], distal esophagus - BENIGN SQUAMOUS AND GLANDULAR MUCOSA WITH REACTIVE CHANGES - NEGATIVE FOR INTESTINAL METAPLASIA, DYSPLASIA OR MALIGNANCY 4. Surgical [P], colon, transverse, polyp (1) - TUBULAR ADENOMA. - NO HIGH GRADE DYSPLASIA OR MALIGNANCY. 5. Surgical [P], colon, rectum ulcer ( at rectal prolapse likely solitary rectal ulcer) - COLONIC MUCOSA WITH PROLAPSE TYPE CHANGE AND MILD ACUTE INFLAMMATION, CONSISTENT WITH SOLITARY RECTAL ULCER         Past Medical History:  Diagnosis Date   Complication of anesthesia     Constipation      GAD (generalized anxiety disorder)     Heart murmur     Heart palpitations      evaluated by cardiologist--- dr h. Shari Prows, note in epic 04-03-2021, normal echo and monitor showed NSR w/ SVT   History of COVID-19      per pt had covid twice in 12/ 2020 with mild symptoms with residual intermittant loss of taste;  and 04/ 2022 with mild symptoms that resolved   History of hyperthyroidism      age 76 s/p RAI   History of kidney stones     History of recurrent UTIs     History of repair of hiatal hernia 02/2020   Hyperlipidemia     Hypothyroidism, postablative      age 54  s/p RAI for hyperthyroidism;  followed by pcp   IDA (iron deficiency anemia)     MDD (major depressive disorder)     OA (osteoarthritis)     Right ureteral calculus     Seasonal allergic rhinitis     Type 2 diabetes mellitus (HCC)      followed by pcp    (07-14-2021  per pt checks blood sugar at home 2-3 times weekly,  fasting sugar --120-130)   Urgency of urination     Wears hearing aid in both ears               Past Surgical History:  Procedure Laterality Date   CESAREAN SECTION        x2  last one 1986  CYSTOSCOPY/URETEROSCOPY/HOLMIUM LASER/STENT PLACEMENT   2012    approx   CYSTOSCOPY/URETEROSCOPY/HOLMIUM LASER/STENT PLACEMENT Right 07/20/2021    Procedure: CYSTOSCOPY/RETROGRADE/URETEROSCOPY/HOLMIUM LASER/STENT PLACEMENT;  Surgeon: Jannifer Hick, MD;  Location: [facility];  Service: Urology;  Laterality: Right;  ONLY NEEDS 45 MIN   LAPAROSCOPIC NISSEN FUNDOPLICATION   02/2020   TOTAL ABDOMINAL HYSTERECTOMY W/ BILATERAL SALPINGOOPHORECTOMY   2002   TOTAL HIP ARTHROPLASTY Right 08/30/2021    Procedure: TOTAL HIP ARTHROPLASTY ANTERIOR APPROACH;  Surgeon: Kathryne Hitch, MD;  Location: [facility] OR;  Service: Orthopedics;  Laterality: Right;         reports that she quit smoking about 36 years ago. Her smoking use included cigarettes. She started smoking about 46 years ago. She has a  5 pack-year smoking history. She has never used smokeless tobacco. She reports that she does not drink alcohol and does not use drugs. family history includes Diabetes in her father and mother. Allergies       Allergies  Allergen Reactions   Curare [Tubocurarine] Other (See Comments)      Per pt was awake and aware that she had paralysis and could not breath              Outpatient Encounter Medications as of 06/01/2023  Medication Sig   Alpha-Lipoic Acid 600 MG TABS Take 1 tablet by mouth every evening.   ALPRAZolam (XANAX) 0.25 MG tablet Take 1 tablet (0.25 mg total) by mouth 2 (two) times daily as needed for anxiety. (Patient not taking: Reported on 02/25/2023)   Ascorbic Acid (VITAMIN C) 1000 MG tablet Take 1,000 mg by mouth daily.   atorvastatin (LIPITOR) 40 MG tablet TAKE 1 TABLET(40 MG) BY MOUTH AT BEDTIME   azelastine (ASTELIN) 0.1 % nasal spray Place 2 sprays into both nostrils 2 (two) times daily.   Blood Glucose Monitoring Suppl DEVI 1 each by Does not apply route in the morning, at noon, and at bedtime. May substitute to any manufacturer covered by patient's insurance.   cetirizine (ZYRTEC) 10 MG tablet Take 10 mg by mouth daily.   cholecalciferol (VITAMIN D3) 25 MCG (1000 UNIT) tablet Take 1,000 Units by mouth daily.   docusate sodium (COLACE) 100 MG capsule Take 200 mg by mouth daily.   fluticasone (FLONASE) 50 MCG/ACT nasal spray Place 2 sprays into both nostrils daily.   Lancets (ONETOUCH DELICA PLUS LANCET30G) MISC     levothyroxine (SYNTHROID) 150 MCG tablet TAKE 1 TABLET(150 MCG) BY MOUTH DAILY BEFORE BREAKFAST   Magnesium 250 MG TABS Take 1 tablet by mouth daily.   metFORMIN (GLUCOPHAGE) 500 MG tablet TAKE 1 AND 1/2 TABLETS BY MOUTH DAILY, WITH BREAKFAST, AND 1 TABLET EVERY EVENING   Misc Natural Products (GLUCOSAMINE CHOND CMP ADVANCED PO) Take 2 capsules by mouth every evening.   nortriptyline (PAMELOR) 50 MG capsule TAKE 1 CAPSULE(50 MG) BY MOUTH AT BEDTIME    Omega-3 Fatty Acids (OMEGA-3 FISH OIL PO) Take 2,000 capsules by mouth every evening.   ONETOUCH ULTRA test strip     pantoprazole (PROTONIX) 40 MG tablet Take 1 tablet (40 mg total) by mouth 2 (two) times daily. Two times  daily for 8 weeks then once daily.   Probiotic Product (PROBIOTIC DAILY) CAPS Take 1 capsule by mouth every evening.   sertraline (ZOLOFT) 50 MG tablet TAKE 1 TABLET(50 MG) BY MOUTH DAILY   trimethoprim (TRIMPEX) 100 MG tablet TAKE 1 TABLET BY MOUTH EVERYDAY AT BEDTIME   Turmeric 500 MG CAPS Take 1 capsule  by mouth every evening.   [DISCONTINUED] aspirin 81 MG chewable tablet Chew 1 tablet (81 mg total) by mouth 2 (two) times daily. (Patient taking differently: Chew 81 mg by mouth daily.)         Facility-Administered Encounter Medications as of 06/01/2023  Medication   0.9 %  sodium chloride infusion        REVIEW OF SYSTEMS  : All other systems reviewed and negative except where noted in the History of Present Illness.     PHYSICAL EXAM: Ht 5\' 5"  (1.651 m)   Wt 147 lb (66.7 kg)   BMI 24.46 kg/m  General: Well developed white female in no acute distress Head: Normocephalic and atraumatic Eyes:  Sclerae anicteric, conjunctiva pink. Ears: Normal auditory acuity Lungs: Clear throughout to auscultation; no W/R/R. Heart: Regular rate and rhythm; no M/R/G. Musculoskeletal: Symmetrical with no gross deformities  Skin: No lesions on visible extremities Extremities: No edema  Neurological: Alert oriented x 4, grossly non-focal Psychological:  Alert and cooperative. Normal mood and affect   ASSESSMENT AND PLAN: *GERD LA grade C esophagitis and gastric ulcer: Was not on any PPI therapy.  Now on pantoprazole 40 mg twice daily x 8 weeks and then will go down to once daily.  Repeat EGD recommended in 8 to 12 weeks.  This is already scheduled on 10/1.  Symptoms are improved and she has only needed to take Tums a couple of times since beginning the Protonix.  She has a  slipped Nissen fundoplication.  Question if she can be reconsidered for a repeat or other surgical intervention if she desires.     CC:  Early, Sung Amabile, NP    Attending physician's note   I have taken history, reviewed the chart and examined the patient. I performed a substantive portion of this encounter, including complete performance of at least one of the key components, in conjunction with the APP. I agree with the Advanced Practitioner's note, impression and recommendations.   For EGD today   Edman Circle, MD [facility] [phone]

## 2023-07-05 NOTE — Progress Notes (Signed)
Called to room to assist during endoscopic procedure.  Patient ID and intended procedure confirmed with present staff. Received instructions for my participation in the procedure from the performing physician.  

## 2023-07-05 NOTE — Op Note (Signed)
[facility] Patient Name: Bianca Phillips Procedure Date: 07/05/2023 8:03 AM MRN: [id number] Endoscopist: Lynann Bologna , MD, [id number] Age: 66 Referring MD:  Date of Birth: [birth year] Gender: Female Account #: [id number] Procedure:                Upper GI endoscopy Indications:              Epigastric abdominal pain. FU GU 03/2023 Medicines:                Monitored Anesthesia Care Procedure:                Pre-Anesthesia Assessment:                           - Prior to the procedure, a History and Physical                            was performed, and patient medications and                            allergies were reviewed. The patient's tolerance of                            previous anesthesia was also reviewed. The risks                            and benefits of the procedure and the sedation                            options and risks were discussed with the patient.                            All questions were answered, and informed consent                            was obtained. Prior Anticoagulants: The patient has                            taken no anticoagulant or antiplatelet agents. ASA                            Grade Assessment: II - A patient with mild systemic                            disease. After reviewing the risks and benefits,                            the patient was deemed in satisfactory condition to                            undergo the procedure.                           After obtaining informed consent, the endoscope was  passed under direct vision. Throughout the                            procedure, the patient's blood pressure, pulse, and                            oxygen saturations were monitored continuously. The                            Olympus Scope [id number] was introduced through the                            mouth, and advanced to the second part of duodenum.                            The upper GI  endoscopy was accomplished without                            difficulty. The patient tolerated the procedure                            well. Scope In: Scope Out: Findings:                 The lower third of the esophagus was mildly                            tortuous. Esophagitis had resolved.                           A non-obstructing and mild Schatzki ring was found                            at the gastroesophageal junction, 35 cm from the                            incisors. Not dilated since patient not having any                            dysphagia                           A 3 cm hiatal hernia was present. Evidence of                            previous Nissen's fundoplication.                           Complete healing of gastric ulcer. Localized mild                            inflammation characterized by erythema was found in                            the gastric antrum. Biopsies were taken with a cold  forceps for histology.                           The examined duodenum was normal. Complications:            No immediate complications. Estimated Blood Loss:     Estimated blood loss: none. Impression:               - Non-obstructing and mild Schatzki ring.                           - 3 cm hiatal hernia. Evidence of slipped previous                            Nissen's fundoplication.                           - Minimal gastritis. Biopsied. Complete healing of                            gastric ulcer. Recommendation:           - Patient has a contact number available for                            emergencies. The signs and symptoms of potential                            delayed complications were discussed with the                            patient. Return to normal activities tomorrow.                            Written discharge instructions were provided to the                            patient.                           - Resume previous  diet.                           - Continue present medications including Protonix                            40 mg p.o. QD. Can always try it every other day.                           - Nonpharmacologic means of reflux control.                           - Await pathology results.                           - The findings and recommendations were discussed  with the patient's daughter Efraim Kaufmann. Lynann Bologna, MD 07/05/2023 8:18:47 AM This report has been signed electronically.

## 2023-07-05 NOTE — Progress Notes (Signed)
Report to PACU, RN, vss, BBS= Clear.  

## 2023-07-06 ENCOUNTER — Telehealth: Payer: Self-pay

## 2023-07-06 ENCOUNTER — Telehealth: Payer: Self-pay | Admitting: Nurse Practitioner

## 2023-07-06 MED ORDER — METFORMIN HCL 1000 MG PO TABS
1000.0000 mg | ORAL_TABLET | Freq: Every day | ORAL | 1 refills | Status: DC
Start: 2023-07-06 — End: 2023-10-14

## 2023-07-06 NOTE — Telephone Encounter (Signed)
  Follow up Call-     07/05/2023    7:20 AM 04/01/2023    2:51 PM  Call back number  Post procedure Call Back phone  # [phone]  Permission to leave phone message Yes Yes     Patient questions:  Do you have a fever, pain , or abdominal swelling? No. Pain Score  0 *  Have you tolerated food without any problems? Yes.    Have you been able to return to your normal activities? Yes.    Do you have any questions about your discharge instructions: Diet   No. Medications  No. Follow up visit  No.  Do you have questions or concerns about your Care? No.  Actions: * If pain score is 4 or above: No action needed, pain <4.

## 2023-07-06 NOTE — Telephone Encounter (Signed)
Pt called & would like the once a day Metformin sent in to [facility]

## 2023-07-07 LAB — SURGICAL PATHOLOGY

## 2023-07-07 NOTE — Telephone Encounter (Signed)
Sent 10/2 to [facility] in [place].

## 2023-07-09 ENCOUNTER — Encounter: Payer: Self-pay | Admitting: Gastroenterology

## 2023-07-14 ENCOUNTER — Other Ambulatory Visit: Payer: Self-pay | Admitting: Nurse Practitioner

## 2023-07-28 ENCOUNTER — Other Ambulatory Visit: Payer: Self-pay | Admitting: Gastroenterology

## 2023-07-28 DIAGNOSIS — K219 Gastro-esophageal reflux disease without esophagitis: Secondary | ICD-10-CM

## 2023-07-28 DIAGNOSIS — K259 Gastric ulcer, unspecified as acute or chronic, without hemorrhage or perforation: Secondary | ICD-10-CM

## 2023-09-09 ENCOUNTER — Other Ambulatory Visit: Payer: Self-pay

## 2023-09-09 DIAGNOSIS — R7989 Other specified abnormal findings of blood chemistry: Secondary | ICD-10-CM

## 2023-09-15 ENCOUNTER — Other Ambulatory Visit: Payer: HMO

## 2023-09-15 DIAGNOSIS — R7989 Other specified abnormal findings of blood chemistry: Secondary | ICD-10-CM | POA: Diagnosis not present

## 2023-09-16 LAB — TSH: TSH: 3.97 m[IU]/L (ref 0.40–4.50)

## 2023-09-16 LAB — T4, FREE: Free T4: 2.6 ng/dL — ABNORMAL HIGH (ref 0.8–1.8)

## 2023-09-20 ENCOUNTER — Ambulatory Visit (INDEPENDENT_AMBULATORY_CARE_PROVIDER_SITE_OTHER): Payer: HMO | Admitting: "Endocrinology

## 2023-09-20 ENCOUNTER — Encounter: Payer: Self-pay | Admitting: "Endocrinology

## 2023-09-20 VITALS — BP 122/72 | HR 113 | Ht 65.0 in | Wt 148.2 lb

## 2023-09-20 DIAGNOSIS — E1165 Type 2 diabetes mellitus with hyperglycemia: Secondary | ICD-10-CM | POA: Diagnosis not present

## 2023-09-20 DIAGNOSIS — R7989 Other specified abnormal findings of blood chemistry: Secondary | ICD-10-CM | POA: Diagnosis not present

## 2023-09-20 DIAGNOSIS — Z7984 Long term (current) use of oral hypoglycemic drugs: Secondary | ICD-10-CM

## 2023-09-20 NOTE — Progress Notes (Signed)
Outpatient Endocrinology Note Bianca Byron, MD  09/20/23   Bianca Phillips 18-Jan-1956 [id number]  Referring Provider: Tollie Eth, NP Primary Care Provider: Tollie Eth, NP Subjective  Chief Complaint  Patient presents with   Establish Care    Assessment & Plan  Bianca Phillips was seen today for establish care.  Diagnoses and all orders for this visit:  Abnormal thyroid blood test -     T3, free -     Cancel: Hemoglobin A1c -     Thyroid stimulating immunoglobulin -     TRAb (TSH Receptor Binding Antibody)  Uncontrolled type 2 diabetes mellitus with hyperglycemia (HCC)    Bianca Phillips is currently taking levothyroxine with other pills. History of hyperthyroidism (unknown etiology) s/p RAI ablation  Patient is currently biochemically normal TSH with persistently high FT4 and high heart rate.  Educated on thyroid axis.  Recommend the following: Take levothyroxine every morning.  Advised to take levothyroxine first thing in the morning on empty stomach and wait at least 30 minutes to 1 hour before eating or drinking anything or taking any other medications. Space out levothyroxine by 4 hours from any acid reflux medication/fibrate/iron/calcium/multivitamin. Advised to take birth control pills and nutritional supplements in the evening. Repeat lab before next visit or sooner if symptoms of hyperthyroidism or hypothyroidism develop.  Notify us immediately in case of significant weight gain or loss. Counseled on compliance and follow up needs.  C/o diabetes not well controlled, BG can go up to 170s On metformin 1000 mg qd, atorvastatin 40 mg qsd, no ACE/ARB Couldn't tolerate farxiga due to dehydration Requests A1C, ordered   I have reviewed current medications, nurse's notes, allergies, vital signs, past medical and surgical history, family medical history, and social history for this encounter. Counseled patient on symptoms, examination findings, lab  findings, imaging results, treatment decisions and monitoring and prognosis. The patient understood the recommendations and agrees with the treatment plan. All questions regarding treatment plan were fully answered.   Return in about 9 days (around 09/29/2023) for visit, labs today.   Bianca Big Stone, MD  09/20/23   I have reviewed current medications, nurse's notes, allergies, vital signs, past medical and surgical history, family medical history, and social history for this encounter. Counseled patient on symptoms, examination findings, lab findings, imaging results, treatment decisions and monitoring and prognosis. The patient understood the recommendations and agrees with the treatment plan. All questions regarding treatment plan were fully answered.   History of Present Illness Bianca Phillips is a 67 y.o. year old female who presents to our clinic with abnormal thyroid diagnosed diagnosed around age 67.  Patient reports tiredness and itching  Symptoms suggestive of HYPOTHYROIDISM:  fatigue Yes weight gain No cold intolerance  No constipation  Yes, on fiber   Symptoms suggestive of HYPERTHYROIDISM:  weight loss  No heat intolerance No hyperdefecation  No palpitations  Yes  Compressive symptoms:  dysphagia  No dysphonia  No positional dyspnea (especially with simultaneous arms elevation)  No  Smokes  No On biotin  No Personal history of head/neck surgery/irradiation  Yes   Physical Exam  BP 122/72 (BP Location: Left Arm, Patient Position: Sitting, Cuff Size: Normal)   Pulse (!) 113   Ht 5\' 5"  (1.651 m)   Wt 148 lb 3.2 oz (67.2 kg)   SpO2 98%   BMI 24.66 kg/m  Constitutional: well developed, well nourished Head: normocephalic, atraumatic, no exophthalmos Eyes: sclera anicteric, no redness Neck:  no thyromegaly, no thyroid tenderness; no nodules palpated Lungs: normal respiratory effort Neurology: alert and oriented, no fine hand tremor Skin: dry, no appreciable  rashes Musculoskeletal: no appreciable defects Psychiatric: normal mood and affect  Allergies Allergies  Allergen Reactions   Curare [Tubocurarine] Other (See Comments)    Per pt was awake and aware that she had paralysis and could not breath    Current Medications Patient's Medications  New Prescriptions   No medications on file  Previous Medications   ALPHA-LIPOIC ACID 600 MG TABS    Take 1 tablet by mouth every evening.   ALPRAZOLAM (XANAX) 0.25 MG TABLET    Take 1 tablet (0.25 mg total) by mouth 2 (two) times daily as needed for anxiety.   ASCORBIC ACID (VITAMIN C) 1000 MG TABLET    Take 1,000 mg by mouth daily.   ATORVASTATIN (LIPITOR) 40 MG TABLET    Take 2 tabs 3 days a week and 1 tablet 4 days a week.   AZELASTINE (ASTELIN) 0.1 % NASAL SPRAY    Place 2 sprays into both nostrils 2 (two) times daily.   BLOOD GLUCOSE MONITORING SUPPL DEVI    1 each by Does not apply route in the morning, at noon, and at bedtime. May substitute to any manufacturer covered by patient's insurance.   CETIRIZINE (ZYRTEC) 10 MG TABLET    Take 10 mg by mouth daily.   CHOLECALCIFEROL (VITAMIN D3) 25 MCG (1000 UNIT) TABLET    Take 1,000 Units by mouth daily.   DOCUSATE SODIUM (COLACE) 100 MG CAPSULE    Take 200 mg by mouth daily.   LANCETS (ONETOUCH DELICA PLUS LANCET30G) MISC       LEVOTHYROXINE (SYNTHROID) 150 MCG TABLET    TAKE 1 TABLET(150 MCG) BY MOUTH DAILY BEFORE BREAKFAST   MAGNESIUM 250 MG TABS    Take 1 tablet by mouth daily.   METFORMIN (GLUCOPHAGE) 1000 MG TABLET    Take 1 tablet (1,000 mg total) by mouth daily with breakfast.   MISC NATURAL PRODUCTS (GLUCOSAMINE CHOND CMP ADVANCED PO)    Take 2 capsules by mouth every evening.   NORTRIPTYLINE (PAMELOR) 50 MG CAPSULE    TAKE 1 CAPSULE(50 MG) BY MOUTH AT BEDTIME   OMEGA-3 FATTY ACIDS (OMEGA-3 FISH OIL PO)    Take 2,000 capsules by mouth every evening.   ONETOUCH ULTRA TEST STRIP       PANTOPRAZOLE (PROTONIX) 40 MG TABLET    Take 1 tablet (40  mg total) by mouth 2 (two) times daily. Two times  daily for 8 weeks then once daily.   PANTOPRAZOLE (PROTONIX) 40 MG TABLET    Take 1 tablet (40 mg total) by mouth daily.   PROBIOTIC PRODUCT (PROBIOTIC DAILY) CAPS    Take 1 capsule by mouth every evening.   SERTRALINE (ZOLOFT) 50 MG TABLET    TAKE 1 TABLET(50 MG) BY MOUTH DAILY   TRIMETHOPRIM (TRIMPEX) 100 MG TABLET    TAKE 1 TABLET BY MOUTH EVERYDAY AT BEDTIME   TURMERIC 500 MG CAPS    Take 1 capsule by mouth every evening.  Modified Medications   No medications on file  Discontinued Medications   ASPIRIN EC 81 MG TABLET    Take 1 tablet (81 mg total) by mouth daily. Swallow whole.    Past Medical History Past Medical History:  Diagnosis Date   Allergy    Complication of anesthesia    Constipation    GAD (generalized anxiety disorder)    GERD (gastroesophageal  reflux disease)    Heart murmur    Heart palpitations    evaluated by cardiologist--- dr h. Shari Prows, note in epic 04-03-2021, normal echo and monitor showed NSR w/ SVT   History of COVID-19    per pt had covid twice in 12/ 2020 with mild symptoms with residual intermittant loss of taste;  and 04/ 2022 with mild symptoms that resolved   History of hyperthyroidism    age 38 s/p RAI   History of kidney stones    History of recurrent UTIs    History of repair of hiatal hernia 02/2020   Hyperlipidemia    Hypothyroidism, postablative    age 61  s/p RAI for hyperthyroidism;  followed by pcp   IDA (iron deficiency anemia)    MDD (major depressive disorder)    OA (osteoarthritis)    Right ureteral calculus    Seasonal allergic rhinitis    Type 2 diabetes mellitus (HCC)    followed by pcp    (07-14-2021  per pt checks blood sugar at home 2-3 times weekly,  fasting sugar --120-130)   Urgency of urination    Wears hearing aid in both ears     Past Surgical History Past Surgical History:  Procedure Laterality Date   CESAREAN SECTION     x2  last one 1986    CYSTOSCOPY/URETEROSCOPY/HOLMIUM LASER/STENT PLACEMENT  2012   approx   CYSTOSCOPY/URETEROSCOPY/HOLMIUM LASER/STENT PLACEMENT Right 07/20/2021   Procedure: CYSTOSCOPY/RETROGRADE/URETEROSCOPY/HOLMIUM LASER/STENT PLACEMENT;  Surgeon: Jannifer Hick, MD;  Location: [facility];  Service: Urology;  Laterality: Right;  ONLY NEEDS 45 MIN   LAPAROSCOPIC NISSEN FUNDOPLICATION  02/2020   TOTAL ABDOMINAL HYSTERECTOMY W/ BILATERAL SALPINGOOPHORECTOMY  2002   TOTAL HIP ARTHROPLASTY Right 08/30/2021   Procedure: TOTAL HIP ARTHROPLASTY ANTERIOR APPROACH;  Surgeon: Kathryne Hitch, MD;  Location: [facility] OR;  Service: Orthopedics;  Laterality: Right;   UPPER GASTROINTESTINAL ENDOSCOPY      Family History family history includes Diabetes in her father and mother.  Social History Social History   Socioeconomic History   Marital status: Divorced    Spouse name: Not on file   Number of children: 3   Years of education: 16   Highest education level: Bachelor's degree (e.g., BA, AB, BS)  Occupational History   Occupation: retired    Comment: Runner, broadcasting/film/video  Tobacco Use   Smoking status: Former    Current packs/day: 0.00    Average packs/day: 0.5 packs/day for 10.0 years (5.0 ttl pk-yrs)    Types: Cigarettes    Start date: 88    Quit date: 1988    Years since quitting: 36.9   Smokeless tobacco: Never   Tobacco comments:    35 years ago  Vaping Use   Vaping status: Never Used  Substance and Sexual Activity   Alcohol use: Never   Drug use: Never   Sexual activity: Not Currently    Birth control/protection: Post-menopausal, Surgical  Other Topics Concern   Not on file  Social History Narrative   Not on file   Social Drivers of Health   Financial Resource Strain: Low Risk  (11/02/2022)   Overall Financial Resource Strain (CARDIA)    Difficulty of Paying Living Expenses: Not hard at all  Food Insecurity: No Food Insecurity (11/02/2022)   Hunger Vital Sign    Worried About  Running Out of Food in the Last Year: Never true    Ran Out of Food in the Last Year: Never true  Transportation Needs:  No Transportation Needs (11/02/2022)   PRAPARE - Administrator, Civil Service (Medical): No    Lack of Transportation (Non-Medical): No  Physical Activity: Insufficiently Active (11/02/2022)   Exercise Vital Sign    Days of Exercise per Week: 4 days    Minutes of Exercise per Session: 30 min  Stress: Stress Concern Present (11/02/2022)   [employer] of Occupational Health - Occupational Stress Questionnaire    Feeling of Stress : To some extent  Social Connections: Moderately Integrated (11/02/2022)   Social Connection and Isolation Panel [NHANES]    Frequency of Communication with Friends and Family: More than three times a week    Frequency of Social Gatherings with Friends and Family: More than three times a week    Attends Religious Services: More than 4 times per year    Active Member of Clubs or Organizations: Yes    Attends Banker Meetings: More than 4 times per year    Marital Status: Divorced  Intimate Partner Violence: Not At Risk (11/02/2022)   Humiliation, Afraid, Rape, and Kick questionnaire    Fear of Current or Ex-Partner: No    Emotionally Abused: No    Physically Abused: No    Sexually Abused: No    Laboratory Investigations Lab Results  Component Value Date   TSH 3.97 09/15/2023   TSH 4.850 (H) 06/24/2023   TSH 3.890 10/27/2022   FREET4 2.6 (H) 09/15/2023   FREET4 3.16 (H) 06/24/2023   FREET4 3.11 (H) 10/27/2022     No results found for: "TSI"   No components found for: "TRAB"   Lab Results  Component Value Date   CHOL 174 06/24/2023   Lab Results  Component Value Date   HDL 37 (L) 06/24/2023   Lab Results  Component Value Date   LDLCALC 93 06/24/2023   Lab Results  Component Value Date   TRIG 263 (H) 06/24/2023   Lab Results  Component Value Date   CHOLHDL 4.1 10/27/2022   Lab Results   Component Value Date   CREATININE 1.12 (H) 06/24/2023   No results found for: "GFR"    Component Value Date/Time   NA 138 06/24/2023 1000   K 4.5 06/24/2023 1000   CL 101 06/24/2023 1000   CO2 23 06/24/2023 1000   GLUCOSE 155 (H) 06/24/2023 1000   GLUCOSE 126 (H) 03/12/2022 0415   BUN 20 06/24/2023 1000   CREATININE 1.12 (H) 06/24/2023 1000   CALCIUM 9.4 06/24/2023 1000   PROT 6.6 06/24/2023 1000   ALBUMIN 4.4 06/24/2023 1000   AST 21 06/24/2023 1000   ALT 22 06/24/2023 1000   ALKPHOS 113 06/24/2023 1000   BILITOT 0.4 06/24/2023 1000   GFRNONAA 54 (L) 03/12/2022 0415      Latest Ref Rng & Units 06/24/2023   10:00 AM 10/27/2022   11:11 AM 07/14/2022    1:05 PM  BMP  Glucose 70 - 99 mg/dL 696  295  284   BUN 8 - 27 mg/dL 20  26  20    Creatinine 0.57 - 1.00 mg/dL 1.32  4.40  1.02   BUN/Creat Ratio 12 - 28 18  21  16    Sodium 134 - 144 mmol/L 138  143  140   Potassium 3.5 - 5.2 mmol/L 4.5  4.9  4.4   Chloride 96 - 106 mmol/L 101  103  99   CO2 20 - 29 mmol/L 23  25  22    Calcium 8.7 - 10.3  mg/dL 9.4  09.8  11.9        Component Value Date/Time   WBC 5.0 06/24/2023 1000   WBC 7.8 03/12/2022 0415   RBC 4.58 06/24/2023 1000   RBC 4.80 03/12/2022 0415   HGB 13.5 06/24/2023 1000   HCT 42.8 06/24/2023 1000   PLT 247 06/24/2023 1000   MCV 93 06/24/2023 1000   MCH 29.5 06/24/2023 1000   MCH 29.0 03/12/2022 0415   MCHC 31.5 06/24/2023 1000   MCHC 32.6 03/12/2022 0415   RDW 12.5 06/24/2023 1000   LYMPHSABS 2.1 06/24/2023 1000   MONOABS 0.5 08/29/2021 1213   EOSABS 0.2 06/24/2023 1000   BASOSABS 0.0 06/24/2023 1000      Parts of this note may have been dictated using voice recognition software. There may be variances in spelling and vocabulary which are unintentional. Not all errors are proofread. Please notify the Thereasa Parkin if any discrepancies are noted or if the meaning of any statement is not clear.

## 2023-09-24 LAB — T3, FREE: T3, Free: 4.8 pg/mL — ABNORMAL HIGH (ref 2.3–4.2)

## 2023-09-24 LAB — TRAB (TSH RECEPTOR BINDING ANTIBODY): TRAB: <1 [IU]/L (ref <=2.00)

## 2023-09-24 LAB — THYROID STIMULATING IMMUNOGLOBULIN: TSI: <89 %{baseline} (ref <140)

## 2023-09-30 ENCOUNTER — Encounter: Payer: Self-pay | Admitting: "Endocrinology

## 2023-09-30 ENCOUNTER — Ambulatory Visit (INDEPENDENT_AMBULATORY_CARE_PROVIDER_SITE_OTHER): Payer: HMO | Admitting: "Endocrinology

## 2023-09-30 VITALS — BP 140/62 | HR 101 | Resp 16 | Ht 65.0 in | Wt 148.6 lb

## 2023-09-30 DIAGNOSIS — E1165 Type 2 diabetes mellitus with hyperglycemia: Secondary | ICD-10-CM

## 2023-09-30 DIAGNOSIS — R7989 Other specified abnormal findings of blood chemistry: Secondary | ICD-10-CM | POA: Diagnosis not present

## 2023-09-30 DIAGNOSIS — E782 Mixed hyperlipidemia: Secondary | ICD-10-CM

## 2023-09-30 DIAGNOSIS — Z7984 Long term (current) use of oral hypoglycemic drugs: Secondary | ICD-10-CM | POA: Diagnosis not present

## 2023-09-30 LAB — POCT GLYCOSYLATED HEMOGLOBIN (HGB A1C): Hemoglobin A1C: 7.2 % — AB (ref 4.0–5.6)

## 2023-09-30 MED ORDER — LEVOTHYROXINE SODIUM 137 MCG PO TABS: 137.0000 ug | ORAL_TABLET | Freq: Every day | ORAL | 1 refills | Status: DC

## 2023-09-30 NOTE — Progress Notes (Signed)
Outpatient Endocrinology Note Bianca Monterey, MD  09/30/23   Bianca Phillips 1955-11-12 [id number]  Referring Provider: Tollie Eth, NP Primary Care Provider: Tollie Eth, NP Subjective  No chief complaint on file.   Assessment & Plan  Diagnoses and all orders for this visit:  Abnormal thyroid blood test -     TSH(Reflex) -     T3, free -     T4, free  Uncontrolled type 2 diabetes mellitus with hyperglycemia (HCC) -     POCT HgB A1C -     Hemoglobin A1c -     Basic metabolic panel -     Lipid panel  Long term (current) use of oral hypoglycemic drugs  Mixed hypercholesterolemia and hypertriglyceridemia  Other orders -     levothyroxine (SYNTHROID) 137 MCG tablet; Take 1 tablet (137 mcg total) by mouth daily before breakfast.    Bianca Phillips is currently taking levothyroxine with coffee. History of hyperthyroidism (unknown etiology) s/p RAI ablation  Patient is currently biochemically normal TSH with persistently high FT4, FT3 and high heart rate.  Educated on thyroid axis.  Recommend the following: Decrease levothyroxine to 137 mcg every morning.  Advised to take levothyroxine first thing in the morning on empty stomach and wait at least 30 minutes to 1 hour before eating or drinking anything or taking any other medications. Space out levothyroxine by 4 hours from any acid reflux medication/fibrate/iron/calcium/multivitamin. Advised to take birth control pills and nutritional supplements in the evening. Repeat lab before next visit or sooner if symptoms of hyperthyroidism or hypothyroidism develop.  Notify us immediately in case of significant weight gain or loss. Counseled on compliance and follow up needs.  C/o diabetes not well controlled, BG can go up to 170s On metformin 1000 mg qd, atorvastatin 40 mg qsd, no ACE/ARB Couldn't tolerate farxiga due to dehydration Increase metformin to 1000 mg bid, watch for GFR (last 53)  I have reviewed  current medications, nurse's notes, allergies, vital signs, past medical and surgical history, family medical history, and social history for this encounter. Counseled patient on symptoms, examination findings, lab findings, imaging results, treatment decisions and monitoring and prognosis. The patient understood the recommendations and agrees with the treatment plan. All questions regarding treatment plan were fully answered.   Return in about 4 months (around 01/18/2024) for visit and 8 am labs before next visit.   Bianca [place], MD  09/30/23   I have reviewed current medications, nurse's notes, allergies, vital signs, past medical and surgical history, family medical history, and social history for this encounter. Counseled patient on symptoms, examination findings, lab findings, imaging results, treatment decisions and monitoring and prognosis. The patient understood the recommendations and agrees with the treatment plan. All questions regarding treatment plan were fully answered.   History of Present Illness Bianca Phillips is a 67 y.o. year old female who presents to our clinic with abnormal thyroid diagnosed diagnosed around age 73 as well as diabetes.  Fasting BG 173-176  Feels well overall Taking levothyroxine with her coffee in the morning On metformin 1000 mg 1 pill a day, took 2 pills in the past and well-tolerated and would like to increase it No other active complaints at this point  Initial history: Patient reports tiredness and itching  Symptoms suggestive of HYPOTHYROIDISM:  fatigue Yes weight gain No cold intolerance  No constipation  Yes, on fiber   Symptoms suggestive of HYPERTHYROIDISM:  weight loss  No heat  intolerance No hyperdefecation  No palpitations  Yes  Compressive symptoms:  dysphagia  No dysphonia  No positional dyspnea (especially with simultaneous arms elevation)  No  Smokes  No On biotin  No Personal history of head/neck  surgery/irradiation  Yes    Physical Exam  BP (!) 140/62   Pulse (!) 101   Resp 16   Ht 5\' 5"  (1.651 m)   Wt 148 lb 9.6 oz (67.4 kg)   SpO2 96%   BMI 24.73 kg/m  Constitutional: well developed, well nourished Head: normocephalic, atraumatic, no exophthalmos Eyes: sclera anicteric, no redness Neck: no thyromegaly, no thyroid tenderness; no nodules palpated Lungs: normal respiratory effort Neurology: alert and oriented, no fine hand tremor Skin: dry, no appreciable rashes Musculoskeletal: no appreciable defects Psychiatric: normal mood and affect  Allergies Allergies  Allergen Reactions   Curare [Tubocurarine] Other (See Comments)    Per pt was awake and aware that she had paralysis and could not breath    Current Medications Patient's Medications  New Prescriptions   LEVOTHYROXINE (SYNTHROID) 137 MCG TABLET    Take 1 tablet (137 mcg total) by mouth daily before breakfast.  Previous Medications   ALPHA-LIPOIC ACID 600 MG TABS    Take 1 tablet by mouth every evening.   ALPRAZOLAM (XANAX) 0.25 MG TABLET    Take 1 tablet (0.25 mg total) by mouth 2 (two) times daily as needed for anxiety.   ASCORBIC ACID (VITAMIN C) 1000 MG TABLET    Take 1,000 mg by mouth daily.   ATORVASTATIN (LIPITOR) 40 MG TABLET    Take 2 tabs 3 days a week and 1 tablet 4 days a week.   AZELASTINE (ASTELIN) 0.1 % NASAL SPRAY    Place 2 sprays into both nostrils 2 (two) times daily.   BLOOD GLUCOSE MONITORING SUPPL DEVI    1 each by Does not apply route in the morning, at noon, and at bedtime. May substitute to any manufacturer covered by patient's insurance.   CETIRIZINE (ZYRTEC) 10 MG TABLET    Take 10 mg by mouth daily.   CHOLECALCIFEROL (VITAMIN D3) 25 MCG (1000 UNIT) TABLET    Take 1,000 Units by mouth daily.   DOCUSATE SODIUM (COLACE) 100 MG CAPSULE    Take 200 mg by mouth daily.   LANCETS (ONETOUCH DELICA PLUS LANCET30G) MISC       MAGNESIUM 250 MG TABS    Take 1 tablet by mouth daily.   METFORMIN  (GLUCOPHAGE) 1000 MG TABLET    Take 1 tablet (1,000 mg total) by mouth daily with breakfast.   MISC NATURAL PRODUCTS (GLUCOSAMINE CHOND CMP ADVANCED PO)    Take 2 capsules by mouth every evening.   NORTRIPTYLINE (PAMELOR) 50 MG CAPSULE    TAKE 1 CAPSULE(50 MG) BY MOUTH AT BEDTIME   OMEGA-3 FATTY ACIDS (OMEGA-3 FISH OIL PO)    Take 2,000 capsules by mouth every evening.   ONETOUCH ULTRA TEST STRIP       PANTOPRAZOLE (PROTONIX) 40 MG TABLET    Take 1 tablet (40 mg total) by mouth 2 (two) times daily. Two times  daily for 8 weeks then once daily.   PANTOPRAZOLE (PROTONIX) 40 MG TABLET    Take 1 tablet (40 mg total) by mouth daily.   PROBIOTIC PRODUCT (PROBIOTIC DAILY) CAPS    Take 1 capsule by mouth every evening.   SERTRALINE (ZOLOFT) 50 MG TABLET    TAKE 1 TABLET(50 MG) BY MOUTH DAILY   TRIMETHOPRIM (TRIMPEX) 100 MG  TABLET    TAKE 1 TABLET BY MOUTH EVERYDAY AT BEDTIME   TURMERIC 500 MG CAPS    Take 1 capsule by mouth every evening.  Modified Medications   No medications on file  Discontinued Medications   LEVOTHYROXINE (SYNTHROID) 150 MCG TABLET    TAKE 1 TABLET(150 MCG) BY MOUTH DAILY BEFORE BREAKFAST    Past Medical History Past Medical History:  Diagnosis Date   Allergy    Complication of anesthesia    Constipation    GAD (generalized anxiety disorder)    GERD (gastroesophageal reflux disease)    Heart murmur    Heart palpitations    evaluated by cardiologist--- dr h. Shari Prows, note in epic 04-03-2021, normal echo and monitor showed NSR w/ SVT   History of COVID-19    per pt had covid twice in 12/ 2020 with mild symptoms with residual intermittant loss of taste;  and 04/ 2022 with mild symptoms that resolved   History of hyperthyroidism    age 50 s/p RAI   History of kidney stones    History of recurrent UTIs    History of repair of hiatal hernia 02/2020   Hyperlipidemia    Hypothyroidism, postablative    age 61  s/p RAI for hyperthyroidism;  followed by pcp   IDA (iron  deficiency anemia)    MDD (major depressive disorder)    OA (osteoarthritis)    Right ureteral calculus    Seasonal allergic rhinitis    Type 2 diabetes mellitus (HCC)    followed by pcp    (07-14-2021  per pt checks blood sugar at home 2-3 times weekly,  fasting sugar --120-130)   Urgency of urination    Wears hearing aid in both ears     Past Surgical History Past Surgical History:  Procedure Laterality Date   CESAREAN SECTION     x2  last one 1986   CYSTOSCOPY/URETEROSCOPY/HOLMIUM LASER/STENT PLACEMENT  2012   approx   CYSTOSCOPY/URETEROSCOPY/HOLMIUM LASER/STENT PLACEMENT Right 07/20/2021   Procedure: CYSTOSCOPY/RETROGRADE/URETEROSCOPY/HOLMIUM LASER/STENT PLACEMENT;  Surgeon: Jannifer Hick, MD;  Location: [facility];  Service: Urology;  Laterality: Right;  ONLY NEEDS 45 MIN   LAPAROSCOPIC NISSEN FUNDOPLICATION  02/2020   TOTAL ABDOMINAL HYSTERECTOMY W/ BILATERAL SALPINGOOPHORECTOMY  2002   TOTAL HIP ARTHROPLASTY Right 08/30/2021   Procedure: TOTAL HIP ARTHROPLASTY ANTERIOR APPROACH;  Surgeon: Kathryne Hitch, MD;  Location: [facility] OR;  Service: Orthopedics;  Laterality: Right;   UPPER GASTROINTESTINAL ENDOSCOPY      Family History family history includes Diabetes in her father and mother.  Social History Social History   Socioeconomic History   Marital status: Divorced    Spouse name: Not on file   Number of children: 3   Years of education: 16   Highest education level: Bachelor's degree (e.g., BA, AB, BS)  Occupational History   Occupation: retired    Comment: Runner, broadcasting/film/video  Tobacco Use   Smoking status: Former    Current packs/day: 0.00    Average packs/day: 0.5 packs/day for 10.0 years (5.0 ttl pk-yrs)    Types: Cigarettes    Start date: 41    Quit date: 1988    Years since quitting: 37.0   Smokeless tobacco: Never   Tobacco comments:    35 years ago  Vaping Use   Vaping status: Never Used  Substance and Sexual Activity   Alcohol use:  Never   Drug use: Never   Sexual activity: Not Currently    Birth control/protection: Post-menopausal,  Surgical  Other Topics Concern   Not on file  Social History Narrative   Not on file   Social Drivers of Health   Financial Resource Strain: Low Risk  (11/02/2022)   Overall Financial Resource Strain (CARDIA)    Difficulty of Paying Living Expenses: Not hard at all  Food Insecurity: No Food Insecurity (11/02/2022)   Hunger Vital Sign    Worried About Running Out of Food in the Last Year: Never true    Ran Out of Food in the Last Year: Never true  Transportation Needs: No Transportation Needs (11/02/2022)   PRAPARE - Administrator, Civil Service (Medical): No    Lack of Transportation (Non-Medical): No  Physical Activity: Insufficiently Active (11/02/2022)   Exercise Vital Sign    Days of Exercise per Week: 4 days    Minutes of Exercise per Session: 30 min  Stress: Stress Concern Present (11/02/2022)   [employer] of Occupational Health - Occupational Stress Questionnaire    Feeling of Stress : To some extent  Social Connections: Moderately Integrated (11/02/2022)   Social Connection and Isolation Panel [NHANES]    Frequency of Communication with Friends and Family: More than three times a week    Frequency of Social Gatherings with Friends and Family: More than three times a week    Attends Religious Services: More than 4 times per year    Active Member of Clubs or Organizations: Yes    Attends Banker Meetings: More than 4 times per year    Marital Status: Divorced  Intimate Partner Violence: Not At Risk (11/02/2022)   Humiliation, Afraid, Rape, and Kick questionnaire    Fear of Current or Ex-Partner: No    Emotionally Abused: No    Physically Abused: No    Sexually Abused: No    Laboratory Investigations Lab Results  Component Value Date   TSH 3.97 09/15/2023   TSH 4.850 (H) 06/24/2023   TSH 3.890 10/27/2022   FREET4 2.6 (H) 09/15/2023    FREET4 3.16 (H) 06/24/2023   FREET4 3.11 (H) 10/27/2022     Lab Results  Component Value Date   TSI <89 09/20/2023     No components found for: "TRAB"   Lab Results  Component Value Date   CHOL 174 06/24/2023   Lab Results  Component Value Date   HDL 37 (L) 06/24/2023   Lab Results  Component Value Date   LDLCALC 93 06/24/2023   Lab Results  Component Value Date   TRIG 263 (H) 06/24/2023   Lab Results  Component Value Date   CHOLHDL 4.1 10/27/2022   Lab Results  Component Value Date   CREATININE 1.12 (H) 06/24/2023   No results found for: "GFR"    Component Value Date/Time   NA 138 06/24/2023 1000   K 4.5 06/24/2023 1000   CL 101 06/24/2023 1000   CO2 23 06/24/2023 1000   GLUCOSE 155 (H) 06/24/2023 1000   GLUCOSE 126 (H) 03/12/2022 0415   BUN 20 06/24/2023 1000   CREATININE 1.12 (H) 06/24/2023 1000   CALCIUM 9.4 06/24/2023 1000   PROT 6.6 06/24/2023 1000   ALBUMIN 4.4 06/24/2023 1000   AST 21 06/24/2023 1000   ALT 22 06/24/2023 1000   ALKPHOS 113 06/24/2023 1000   BILITOT 0.4 06/24/2023 1000   GFRNONAA 54 (L) 03/12/2022 0415      Latest Ref Rng & Units 06/24/2023   10:00 AM 10/27/2022   11:11 AM 07/14/2022    1:05  PM  BMP  Glucose 70 - 99 mg/dL 536  644  034   BUN 8 - 27 mg/dL 20  26  20    Creatinine 0.57 - 1.00 mg/dL 7.42  5.95  6.38   BUN/Creat Ratio 12 - 28 18  21  16    Sodium 134 - 144 mmol/L 138  143  140   Potassium 3.5 - 5.2 mmol/L 4.5  4.9  4.4   Chloride 96 - 106 mmol/L 101  103  99   CO2 20 - 29 mmol/L 23  25  22    Calcium 8.7 - 10.3 mg/dL 9.4  75.6  43.3        Component Value Date/Time   WBC 5.0 06/24/2023 1000   WBC 7.8 03/12/2022 0415   RBC 4.58 06/24/2023 1000   RBC 4.80 03/12/2022 0415   HGB 13.5 06/24/2023 1000   HCT 42.8 06/24/2023 1000   PLT 247 06/24/2023 1000   MCV 93 06/24/2023 1000   MCH 29.5 06/24/2023 1000   MCH 29.0 03/12/2022 0415   MCHC 31.5 06/24/2023 1000   MCHC 32.6 03/12/2022 0415   RDW 12.5 06/24/2023  1000   LYMPHSABS 2.1 06/24/2023 1000   MONOABS 0.5 08/29/2021 1213   EOSABS 0.2 06/24/2023 1000   BASOSABS 0.0 06/24/2023 1000      Parts of this note may have been dictated using voice recognition software. There may be variances in spelling and vocabulary which are unintentional. Not all errors are proofread. Please notify the There[facility] Parkin if any discrepancies are noted or if the meaning of any statement is not clear.

## 2023-09-30 NOTE — Patient Instructions (Signed)

## 2023-10-06 DIAGNOSIS — N200 Calculus of kidney: Secondary | ICD-10-CM | POA: Diagnosis not present

## 2023-10-07 DIAGNOSIS — E119 Type 2 diabetes mellitus without complications: Secondary | ICD-10-CM | POA: Diagnosis not present

## 2023-10-07 DIAGNOSIS — H5213 Myopia, bilateral: Secondary | ICD-10-CM | POA: Diagnosis not present

## 2023-10-07 DIAGNOSIS — H02834 Dermatochalasis of left upper eyelid: Secondary | ICD-10-CM | POA: Diagnosis not present

## 2023-10-07 DIAGNOSIS — H02831 Dermatochalasis of right upper eyelid: Secondary | ICD-10-CM | POA: Diagnosis not present

## 2023-10-07 LAB — HM DIABETES EYE EXAM

## 2023-10-09 ENCOUNTER — Other Ambulatory Visit: Payer: Self-pay | Admitting: Nurse Practitioner

## 2023-10-09 DIAGNOSIS — F320 Major depressive disorder, single episode, mild: Secondary | ICD-10-CM

## 2023-10-10 NOTE — Telephone Encounter (Signed)
 Last apt 06/20/23

## 2023-10-12 ENCOUNTER — Other Ambulatory Visit: Payer: Self-pay | Admitting: Nurse Practitioner

## 2023-10-12 DIAGNOSIS — F320 Major depressive disorder, single episode, mild: Secondary | ICD-10-CM

## 2023-10-12 NOTE — Telephone Encounter (Signed)
 Last apt 06/20/23

## 2023-10-14 ENCOUNTER — Other Ambulatory Visit: Payer: Self-pay | Admitting: Nurse Practitioner

## 2023-10-14 DIAGNOSIS — E890 Postprocedural hypothyroidism: Secondary | ICD-10-CM

## 2023-10-26 ENCOUNTER — Other Ambulatory Visit: Payer: Self-pay | Admitting: Nurse Practitioner

## 2023-10-26 DIAGNOSIS — D509 Iron deficiency anemia, unspecified: Secondary | ICD-10-CM | POA: Diagnosis not present

## 2023-10-26 DIAGNOSIS — E1122 Type 2 diabetes mellitus with diabetic chronic kidney disease: Secondary | ICD-10-CM | POA: Diagnosis not present

## 2023-10-26 DIAGNOSIS — N1831 Chronic kidney disease, stage 3a: Secondary | ICD-10-CM | POA: Diagnosis not present

## 2023-10-26 DIAGNOSIS — F411 Generalized anxiety disorder: Secondary | ICD-10-CM

## 2023-10-26 DIAGNOSIS — N200 Calculus of kidney: Secondary | ICD-10-CM | POA: Diagnosis not present

## 2023-10-27 ENCOUNTER — Telehealth: Payer: Self-pay | Admitting: Nurse Practitioner

## 2023-10-27 NOTE — Telephone Encounter (Signed)
Pt called & request refill Xanax

## 2023-10-28 NOTE — Telephone Encounter (Signed)
[facility] pharmacy request refilled already.

## 2023-11-01 ENCOUNTER — Ambulatory Visit: Payer: HMO

## 2023-11-01 DIAGNOSIS — Z0000 Encounter for general adult medical examination without abnormal findings: Secondary | ICD-10-CM

## 2023-11-01 NOTE — Progress Notes (Signed)
Subjective:   Bianca Phillips is a 68 y.o. female who presents for Medicare Annual (Subsequent) preventive examination.  Visit Complete: Virtual I connected with  Bianca Phillips on 11/01/23 by a audio enabled telemedicine application and verified that I am speaking with the correct person using two identifiers.  Interactive audio and video telecommunications were attempted between this provider and patient, however failed, due to patient having technical difficulties OR patient did not have access to video capability.  We continued and completed visit with audio only.  Patient Location: Home  Provider Location: Office/Clinic  I discussed the limitations of evaluation and management by telemedicine. The patient expressed understanding and agreed to proceed.  Vital Signs: Because this visit was a virtual/telehealth visit, some criteria may be missing or patient reported. Any vitals not documented were not able to be obtained and vitals that have been documented are patient reported.    Cardiac Risk Factors include: advanced age (>49men, >77 women);diabetes mellitus;dyslipidemia     Objective:    Today's Vitals   There is no height or weight on file to calculate BMI.     11/01/2023   10:58 AM 02/15/2023    2:32 PM 03/12/2022    3:49 AM 08/29/2021   12:12 PM 07/20/2021    1:14 PM 03/09/2021   11:10 AM  Advanced Directives  Does Patient Have a Medical Advance Directive? No No No No No No  Would patient like information on creating a medical advance directive?  No - Patient declined No - Patient declined No - Patient declined Yes (MAU/Ambulatory/Procedural Areas - Information given)     Current Medications (verified) Outpatient Encounter Medications as of 11/01/2023  Medication Sig   Alpha-Lipoic Acid 600 MG TABS Take 1 tablet by mouth every evening.   ALPRAZolam (XANAX) 0.25 MG tablet TAKE 1 TABLET BY MOUTH 2 TIMES DAILY AS NEEDED FOR ANXIETY.   Ascorbic Acid (VITAMIN C) 1000  MG tablet Take 1,000 mg by mouth daily.   atorvastatin (LIPITOR) 40 MG tablet Take 2 tabs 3 days a week and 1 tablet 4 days a week.   azelastine (ASTELIN) 0.1 % nasal spray Place 2 sprays into both nostrils 2 (two) times daily.   Blood Glucose Monitoring Suppl DEVI 1 each by Does not apply route in the morning, at noon, and at bedtime. May substitute to any manufacturer covered by patient's insurance.   Calcium Carbonate-Vitamin D (CALCIUM-VITAMIN D PO) Take 1 tablet by mouth daily.   cetirizine (ZYRTEC) 10 MG tablet Take 10 mg by mouth daily.   Lancets (ONETOUCH DELICA PLUS LANCET30G) MISC    levothyroxine (SYNTHROID) 137 MCG tablet Take 1 tablet (137 mcg total) by mouth daily before breakfast.   Magnesium 250 MG TABS Take 1 tablet by mouth daily.   metFORMIN (GLUCOPHAGE) 1000 MG tablet TAKE 1 TABLET BY MOUTH EVERY DAY WITH BREAKFAST (Patient taking differently: Takes twice daily)   Misc Natural Products (GLUCOSAMINE CHOND CMP ADVANCED PO) Take 2 capsules by mouth every evening.   nortriptyline (PAMELOR) 50 MG capsule TAKE 1 CAPSULE (50 MG) BY MOUTH AT BEDTIME.   Omega-3 Fatty Acids (OMEGA-3 FISH OIL PO) Take 2,000 capsules by mouth every evening.   ONETOUCH ULTRA test strip    pantoprazole (PROTONIX) 40 MG tablet Take 1 tablet (40 mg total) by mouth 2 (two) times daily. Two times  daily for 8 weeks then once daily.   pantoprazole (PROTONIX) 40 MG tablet Take 1 tablet (40 mg total) by mouth daily.  Probiotic Product (PROBIOTIC DAILY) CAPS Take 1 capsule by mouth every evening.   sertraline (ZOLOFT) 50 MG tablet TAKE 1 TABLET BY MOUTH EVERY DAY   trimethoprim (TRIMPEX) 100 MG tablet TAKE 1 TABLET BY MOUTH EVERYDAY AT BEDTIME   Turmeric 500 MG CAPS Take 1 capsule by mouth every evening.   cholecalciferol (VITAMIN D3) 25 MCG (1000 UNIT) tablet Take 1,000 Units by mouth daily. (Patient not taking: Reported on 11/01/2023)   docusate sodium (COLACE) 100 MG capsule Take 200 mg by mouth daily.   No  facility-administered encounter medications on file as of 11/01/2023.    Allergies (verified) Curare [tubocurarine]   History: Past Medical History:  Diagnosis Date   Allergy    Complication of anesthesia    Constipation    GAD (generalized anxiety disorder)    GERD (gastroesophageal reflux disease)    Heart murmur    Heart palpitations    evaluated by cardiologist--- dr h. Shari Prows, note in epic 04-03-2021, normal echo and monitor showed NSR w/ SVT   History of COVID-19    per pt had covid twice in 12/ 2020 with mild symptoms with residual intermittant loss of taste;  and 04/ 2022 with mild symptoms that resolved   History of hyperthyroidism    age 76 s/p RAI   History of kidney stones    History of recurrent UTIs    History of repair of hiatal hernia 02/2020   Hyperlipidemia    Hypothyroidism, postablative    age 56  s/p RAI for hyperthyroidism;  followed by pcp   IDA (iron deficiency anemia)    MDD (major depressive disorder)    OA (osteoarthritis)    Right ureteral calculus    Seasonal allergic rhinitis    Type 2 diabetes mellitus (HCC)    followed by pcp    (07-14-2021  per pt checks blood sugar at home 2-3 times weekly,  fasting sugar --120-130)   Urgency of urination    Wears hearing aid in both ears    Past Surgical History:  Procedure Laterality Date   CESAREAN SECTION     x2  last one 1986   CYSTOSCOPY/URETEROSCOPY/HOLMIUM LASER/STENT PLACEMENT  2012   approx   CYSTOSCOPY/URETEROSCOPY/HOLMIUM LASER/STENT PLACEMENT Right 07/20/2021   Procedure: CYSTOSCOPY/RETROGRADE/URETEROSCOPY/HOLMIUM LASER/STENT PLACEMENT;  Surgeon: Jannifer Hick, MD;  Location: [facility];  Service: Urology;  Laterality: Right;  ONLY NEEDS 45 MIN   LAPAROSCOPIC NISSEN FUNDOPLICATION  02/2020   TOTAL ABDOMINAL HYSTERECTOMY W/ BILATERAL SALPINGOOPHORECTOMY  2002   TOTAL HIP ARTHROPLASTY Right 08/30/2021   Procedure: TOTAL HIP ARTHROPLASTY ANTERIOR APPROACH;  Surgeon:  Kathryne Hitch, MD;  Location: [facility] OR;  Service: Orthopedics;  Laterality: Right;   UPPER GASTROINTESTINAL ENDOSCOPY     Family History  Problem Relation Age of Onset   Diabetes Mother    Diabetes Father    Colon cancer Neg Hx    Rectal cancer Neg Hx    Stomach cancer Neg Hx    Esophageal cancer Neg Hx    Pancreatic cancer Neg Hx    Liver cancer Neg Hx    Social History   Socioeconomic History   Marital status: Divorced    Spouse name: Not on file   Number of children: 3   Years of education: 16   Highest education level: Bachelor's degree (e.g., BA, AB, BS)  Occupational History   Occupation: retired    Comment: Runner, broadcasting/film/video  Tobacco Use   Smoking status: Former    Current packs/day:  0.00    Average packs/day: 0.5 packs/day for 10.0 years (5.0 ttl pk-yrs)    Types: Cigarettes    Start date: 72    Quit date: 50    Years since quitting: 37.1   Smokeless tobacco: Never   Tobacco comments:    35 years ago  Vaping Use   Vaping status: Never Used  Substance and Sexual Activity   Alcohol use: Never   Drug use: Never   Sexual activity: Not Currently    Birth control/protection: Post-menopausal, Surgical  Other Topics Concern   Not on file  Social History Narrative   Not on file   Social Drivers of Health   Financial Resource Strain: Low Risk  (11/01/2023)   Overall Financial Resource Strain (CARDIA)    Difficulty of Paying Living Expenses: Not hard at all  Food Insecurity: No Food Insecurity (11/01/2023)   Hunger Vital Sign    Worried About Running Out of Food in the Last Year: Never true    Ran Out of Food in the Last Year: Never true  Transportation Needs: No Transportation Needs (11/01/2023)   PRAPARE - Administrator, Civil Service (Medical): No    Lack of Transportation (Non-Medical): No  Physical Activity: Sufficiently Active (11/01/2023)   Exercise Vital Sign    Days of Exercise per Week: 4 days    Minutes of Exercise per Session: 60 min   Stress: No Stress Concern Present (11/01/2023)   [employer] of Occupational Health - Occupational Stress Questionnaire    Feeling of Stress : Not at all  Social Connections: Moderately Integrated (11/01/2023)   Social Connection and Isolation Panel [NHANES]    Frequency of Communication with Friends and Family: More than three times a week    Frequency of Social Gatherings with Friends and Family: More than three times a week    Attends Religious Services: More than 4 times per year    Active Member of [employer] or Organizations: Yes    Attends Engineer, structural: More than 4 times per year    Marital Status: Divorced    Tobacco Counseling Counseling given: Not Answered Tobacco comments: 35 years ago   Clinical Intake:  Pre-visit preparation completed: Yes  Pain : No/denies pain     Nutritional Risks: None Diabetes: Yes CBG done?: No Did pt. bring in CBG monitor from home?: No  How often do you need to have someone help you when you read instructions, pamphlets, or other written materials from your doctor or pharmacy?: 1 - Never  Interpreter Needed?: No  Information entered by :: NAllen LPN   Activities of Daily Living    11/01/2023   10:47 AM  In your present state of health, do you have any difficulty performing the following activities:  Hearing? 1  Comment has hearing aids  Vision? 0  Difficulty concentrating or making decisions? 0  Walking or climbing stairs? 0  Dressing or bathing? 0  Doing errands, shopping? 0  Preparing Food and eating ? N  Using the Toilet? N  In the past six months, have you accidently leaked urine? N  Do you have problems with loss of bowel control? N  Managing your Medications? N  Managing your Finances? N  Housekeeping or managing your Housekeeping? N    Patient Care Team: Early, Sung Amabile, NP as PCP - General (Nurse Practitioner) Crista Elliot, MD as Consulting Physician (Urology) [place], [facility]  Indicate any recent Medical Services you  may have received from other than [facility] providers in the past year (date may be approximate).     Assessment:   This is a routine wellness examination for Geralynn.  Hearing/Vision screen Hearing Screening - Comments:: Has hearing aids that are maintained Vision Screening - Comments:: Regular eye exams, [facility]   Goals Addressed             This Visit's Progress    Patient Stated       11/01/2023, decrease sugar intake       Depression Screen    11/01/2023   11:00 AM 10/26/2022    2:42 PM 01/28/2022    4:08 PM 02/05/2021    1:31 PM  PHQ 2/9 Scores  PHQ - 2 Score 0 0 0 0  PHQ- 9 Score 0   1  Exception Documentation   Medical reason     Fall Risk    11/01/2023   10:59 AM 10/26/2022    2:42 PM 01/28/2022    4:08 PM 02/05/2021    1:30 PM  Fall Risk   Falls in the past year? 0 1 1 1   Number falls in past yr: 0 0 0 0  Injury with Fall? 0 0 1 1  Risk for fall due to : Medication side effect Impaired balance/gait Orthopedic patient No Fall Risks  Follow up Falls prevention discussed;Falls evaluation completed Falls evaluation completed Falls evaluation completed;Education provided     MEDICARE RISK AT HOME: Medicare Risk at Home Any stairs in or around the home?: No If so, are there any without handrails?: No Home free of loose throw rugs in walkways, pet beds, electrical cords, etc?: Yes Adequate lighting in your home to reduce risk of falls?: Yes Life alert?: No Use of a cane, walker or w/c?: No Grab bars in the bathroom?: No Shower chair or bench in shower?: No Elevated toilet seat or a handicapped toilet?: No  TIMED UP AND GO:  Was the test performed?  No    Cognitive Function:        11/01/2023   11:01 AM 11/02/2022    9:44 PM  6CIT Screen  What Year? 0 points 0 points  What month? 0 points 0 points  What time? 0 points 0 points  Count back from 20 0 points 0 points  Months in reverse 0 points 0  points  Repeat phrase 2 points 0 points  Total Score 2 points 0 points    Immunizations Immunization History  Administered Date(s) Administered   Fluad Trivalent(High Dose 65+) 06/20/2023   Pneumococcal Conjugate,unspecified 08/05/2015    TDAP status: Due, Education has been provided regarding the importance of this vaccine. Advised may receive this vaccine at local pharmacy or Health Dept. Aware to provide a copy of the vaccination record if obtained from local pharmacy or Health Dept. Verbalized acceptance and understanding.  Flu Vaccine status: Up to date  Pneumococcal vaccine status: Due, Education has been provided regarding the importance of this vaccine. Advised may receive this vaccine at local pharmacy or Health Dept. Aware to provide a copy of the vaccination record if obtained from local pharmacy or Health Dept. Verbalized acceptance and understanding.  Covid-19 vaccine status: Information provided on how to obtain vaccines.   Qualifies for Shingles Vaccine? Yes   Zostavax completed No   Shingrix Completed?: Yes  Screening Tests Health Maintenance  Topic Date Due   Zoster Vaccines- Shingrix (1 of 2) Never done   COVID-19 Vaccine (1 - 2024-25 season)  Never done   FOOT EXAM  10/27/2023   Pneumonia Vaccine 43+ Years old (1 of 2 - PCV) 06/19/2024 (Originally 02/27/1962)   HEMOGLOBIN A1C  03/30/2024   Diabetic kidney evaluation - eGFR measurement  06/23/2024   Diabetic kidney evaluation - Urine ACR  06/23/2024   OPHTHALMOLOGY EXAM  10/06/2024   Medicare Annual Wellness (AWV)  10/31/2024   MAMMOGRAM  12/30/2024   Colonoscopy  03/31/2030   INFLUENZA VACCINE  Completed   DEXA SCAN  Completed   HPV VACCINES  Aged Out   DTaP/Tdap/Td  Discontinued   Hepatitis C Screening  Discontinued    Health Maintenance  Health Maintenance Due  Topic Date Due   Zoster Vaccines- Shingrix (1 of 2) Never done   COVID-19 Vaccine (1 - 2024-25 season) Never done   FOOT EXAM  10/27/2023     Colorectal cancer screening: Type of screening: Colonoscopy. Completed 04/01/2023. Repeat every 7 years  Mammogram status: Completed 12/31/2022. Repeat every year  Bone Density status: Completed 06/22/2023.   Lung Cancer Screening: (Low Dose CT Chest recommended if Age 30-80 years, 20 pack-year currently smoking OR have quit w/in 15years.) does not qualify.   Lung Cancer Screening Referral: no  Additional Screening:  Hepatitis C Screening: does not qualify;   Vision Screening: Recommended annual ophthalmology exams for early detection of glaucoma and other disorders of the eye. Is the patient up to date with their annual eye exam?  Yes  Who is the provider or what is the name of the office in which the patient attends annual eye exams? [facility] If pt is not established with a provider, would they like to be referred to a provider to establish care? No .   Dental Screening: Recommended annual dental exams for proper oral hygiene  Diabetic Foot Exam: n/a  Community Resource Referral / Chronic Care Management: CRR required this visit?  No   CCM required this visit?  No     Plan:     I have personally reviewed and noted the following in the patient's chart:   Medical and social history Use of alcohol, tobacco or illicit drugs  Current medications and supplements including opioid prescriptions. Patient is not currently taking opioid prescriptions. Functional ability and status Nutritional status Physical activity Advanced directives List of other physicians Hospitalizations, surgeries, and ER visits in previous 12 months Vitals Screenings to include cognitive, depression, and falls Referrals and appointments  In addition, I have reviewed and discussed with patient certain preventive protocols, quality metrics, and best practice recommendations. A written personalized care plan for preventive services as well as general preventive health recommendations were  provided to patient.     Barb Merino, LPN   1/61/0960   After Visit Summary: (MyChart) Due to this being a telephonic visit, the after visit summary with patients personalized plan was offered to patient via MyChart   Nurse Notes: none

## 2023-11-01 NOTE — Patient Instructions (Signed)
Bianca Phillips , Thank you for taking time to come for your Medicare Wellness Visit. I appreciate your ongoing commitment to your health goals. Please review the following plan we discussed and let me know if I can assist you in the future.   Referrals/Orders/Follow-Ups/Clinician Recommendations: none  This is a list of the screening recommended for you and due dates:  Health Maintenance  Topic Date Due   Zoster (Shingles) Vaccine (1 of 2) Never done   COVID-19 Vaccine (1 - 2024-25 season) Never done   Complete foot exam   10/27/2023   Pneumonia Vaccine (1 of 2 - PCV) 06/19/2024*   Hemoglobin A1C  03/30/2024   Yearly kidney function blood test for diabetes  06/23/2024   Yearly kidney health urinalysis for diabetes  06/23/2024   Eye exam for diabetics  10/06/2024   Medicare Annual Wellness Visit  10/31/2024   Mammogram  12/30/2024   Colon Cancer Screening  03/31/2030   Flu Shot  Completed   DEXA scan (bone density measurement)  Completed   HPV Vaccine  Aged Out   DTaP/Tdap/Td vaccine  Discontinued   Hepatitis C Screening  Discontinued  *Topic was postponed. The date shown is not the original due date.    Advanced directives: (ACP Link)Information on Advanced Care Planning can be found at [facility] of [place] Advance Health Care Directives Advance Health Care Directives ([email or web address]   Next Medicare Annual Wellness Visit scheduled for next year: Yes  insert Preventive Care attachment Insert FALL PREVENTION attachment if needed

## 2023-11-02 ENCOUNTER — Other Ambulatory Visit: Payer: Self-pay | Admitting: Nurse Practitioner

## 2023-11-03 NOTE — Telephone Encounter (Signed)
Last apt 06/20/23

## 2023-11-03 NOTE — Telephone Encounter (Signed)
Pt left voicemail about rx trimethoprim refill  She is leaving tomorrow am on plane to go out of town

## 2023-11-09 IMAGING — MG MM DIGITAL SCREENING BILAT W/ TOMO AND CAD
6 of 10 series · 6 of 30 positions shown · non-contrast
Comparison: Previous exam(s).

CLINICAL DATA: Screening.

EXAM:
DIGITAL SCREENING BILATERAL MAMMOGRAM WITH TOMOSYNTHESIS AND CAD
TECHNIQUE: Bilateral screening digital craniocaudal and mediolateral oblique
mammograms were obtained. Bilateral screening digital breast
tomosynthesis was performed. The images were evaluated with
computer-aided detection.

[R MLO synth-2D (1 of 2)]
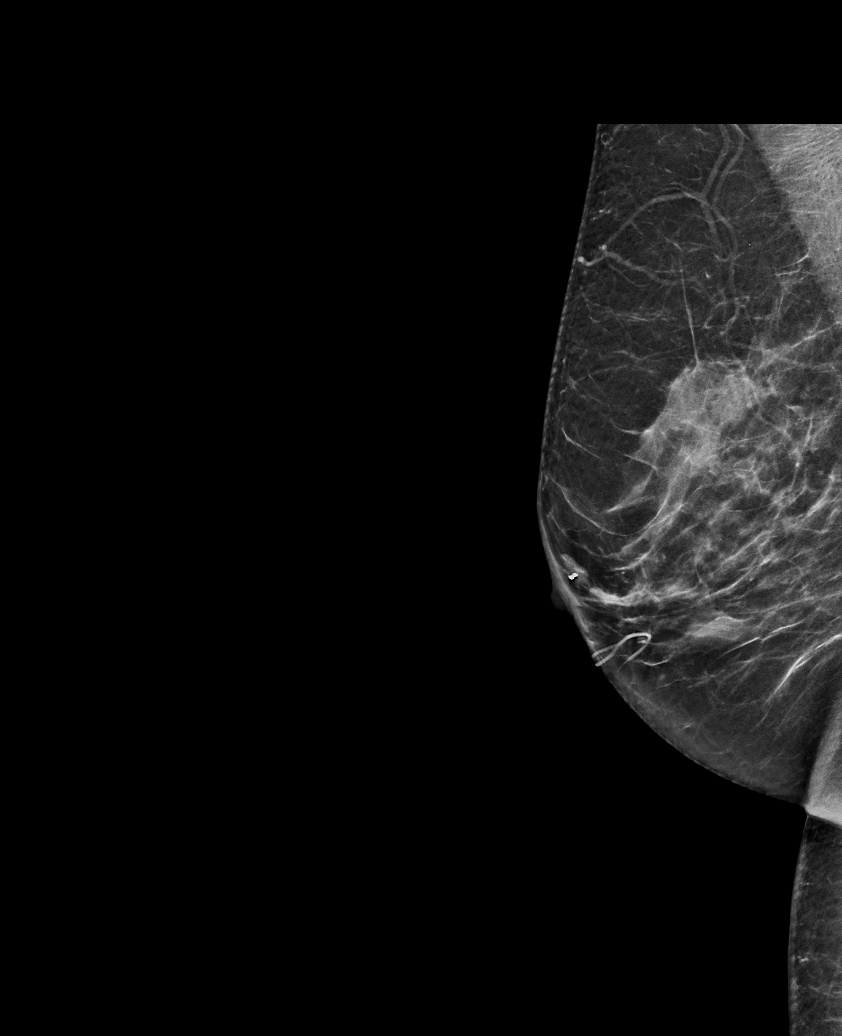

[R CC synth-2D]
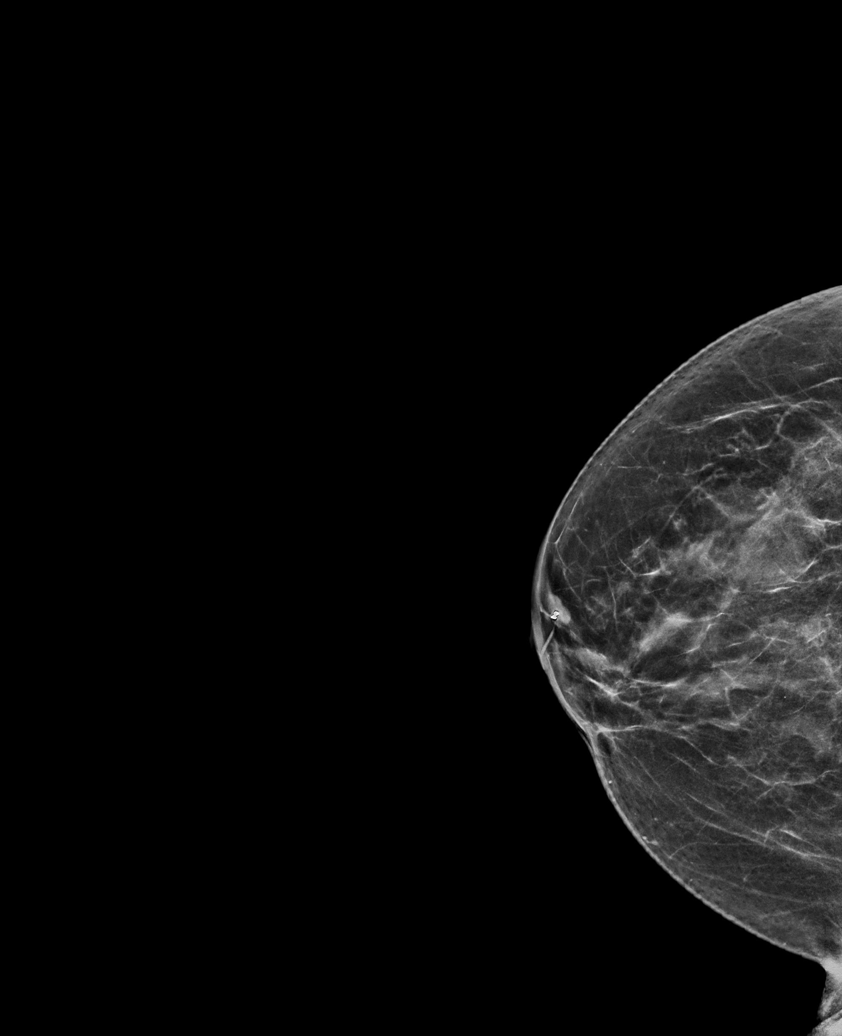

[L MLO synth-2D]
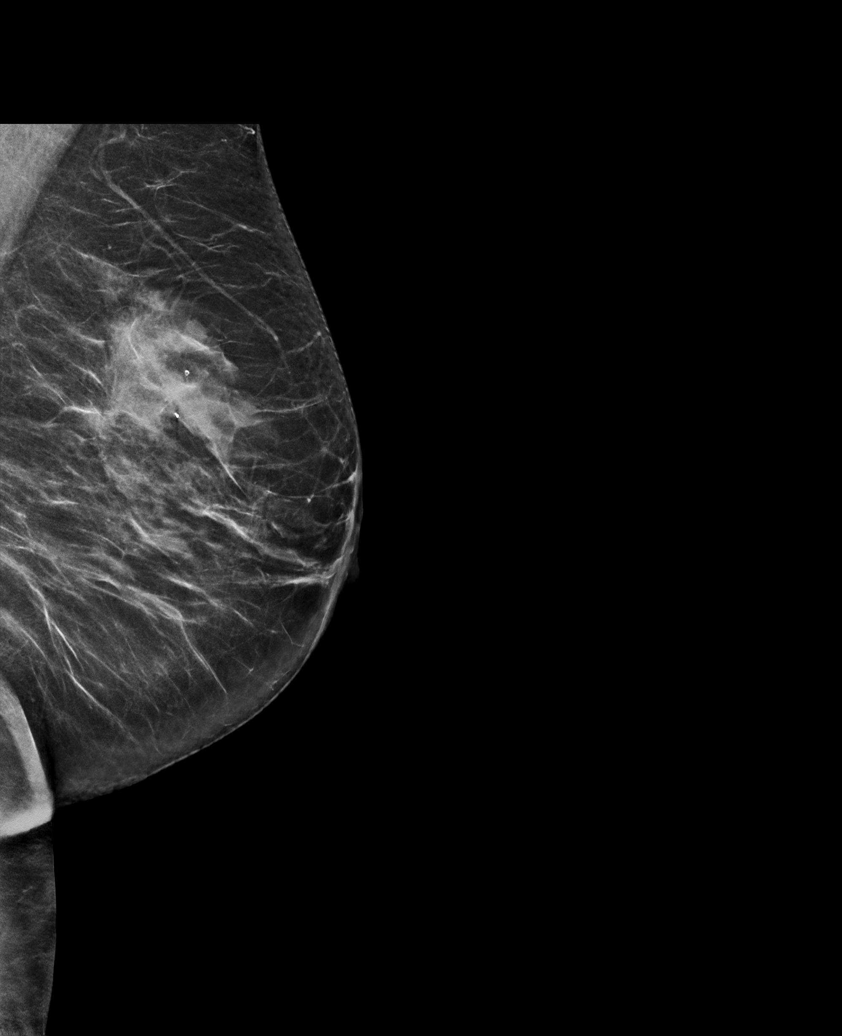

[R MLO synth-2D (2 of 2)]
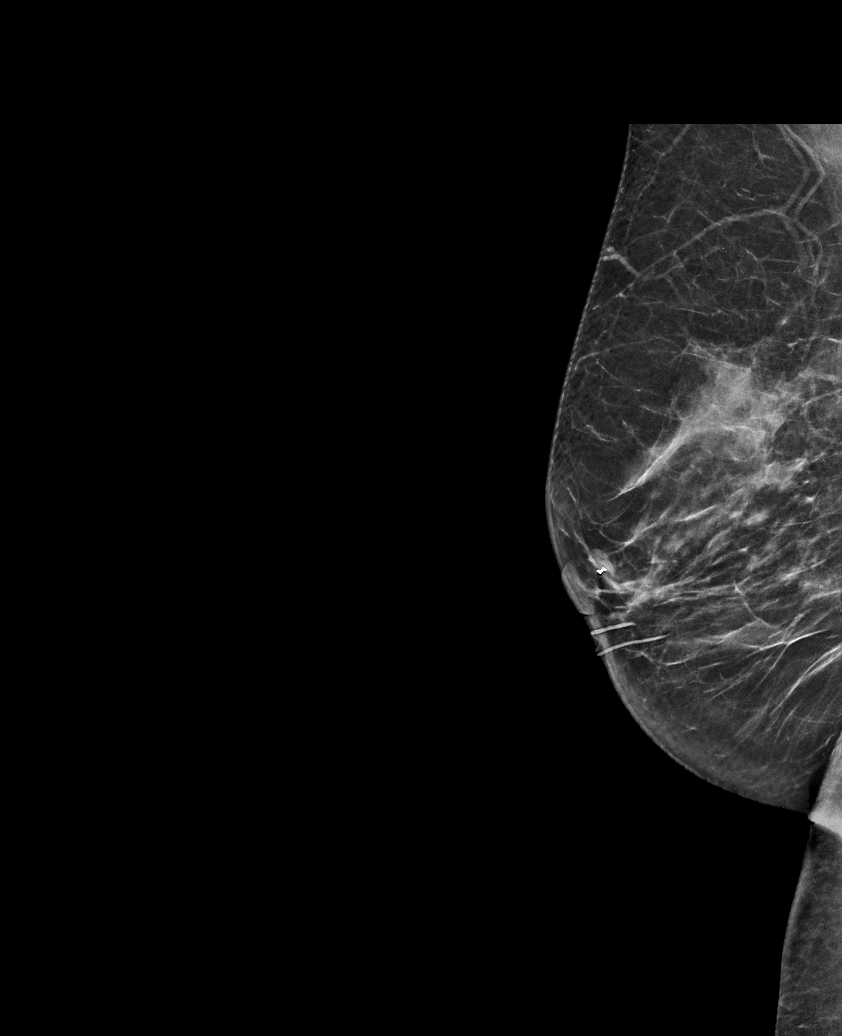

[L CC synth-2D]
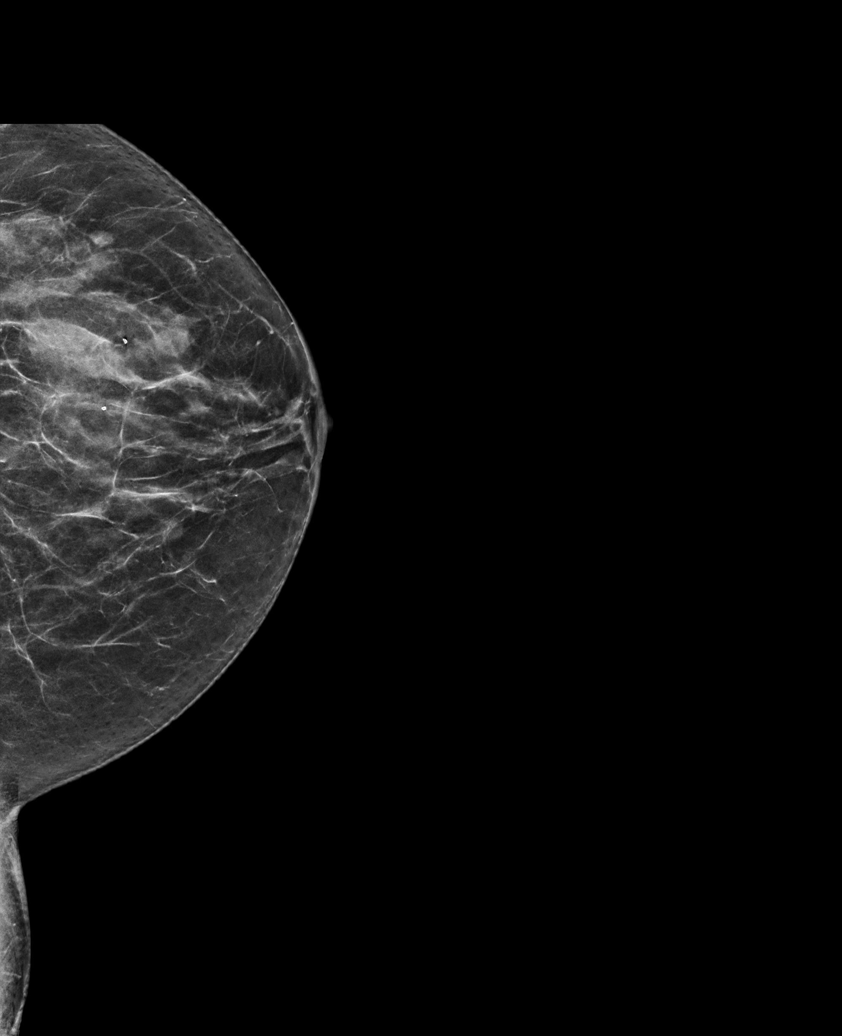

[R CC tomo · tomo slice 31/61.0]
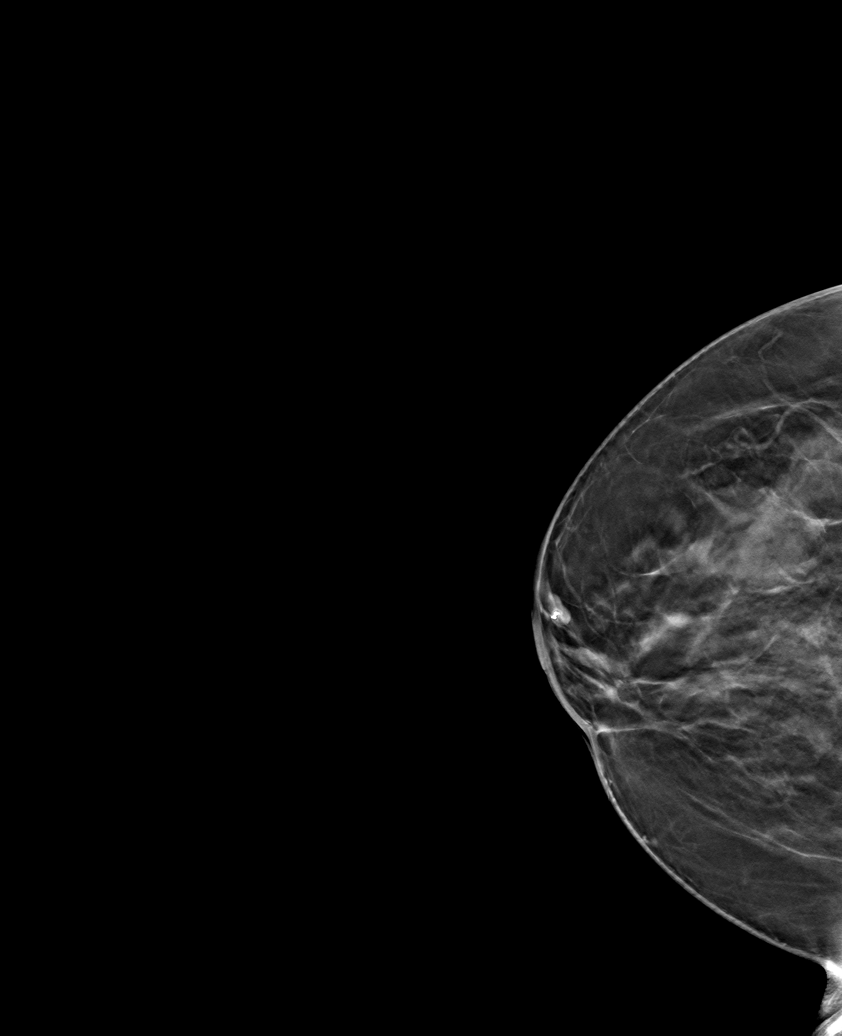

[6 of 30 positions shown; findings below may reference images not displayed]

ACR Breast Density Category c: The breast tissue is heterogeneously
dense, which may obscure small masses.
FINDINGS: There are no findings suspicious for malignancy.
IMPRESSION: No mammographic evidence of malignancy. A result letter of this
screening mammogram will be mailed directly to the patient.

RECOMMENDATION:
Screening mammogram in one year. (Code:Q3-W-BC3)

BI-RADS CATEGORY  1: Negative.

## 2023-12-19 ENCOUNTER — Other Ambulatory Visit: Payer: Self-pay | Admitting: Gastroenterology

## 2023-12-19 DIAGNOSIS — K259 Gastric ulcer, unspecified as acute or chronic, without hemorrhage or perforation: Secondary | ICD-10-CM

## 2023-12-19 DIAGNOSIS — K219 Gastro-esophageal reflux disease without esophagitis: Secondary | ICD-10-CM

## 2023-12-23 ENCOUNTER — Telehealth: Payer: Self-pay | Admitting: Internal Medicine

## 2023-12-23 ENCOUNTER — Other Ambulatory Visit: Payer: Self-pay | Admitting: Nurse Practitioner

## 2023-12-23 DIAGNOSIS — E1165 Type 2 diabetes mellitus with hyperglycemia: Secondary | ICD-10-CM

## 2023-12-23 DIAGNOSIS — E782 Mixed hyperlipidemia: Secondary | ICD-10-CM

## 2023-12-23 NOTE — Telephone Encounter (Deleted)
 Copied from CRM [id number]. Topic: Clinical - Medication Question >> Dec 23, 2023 12:53 PM Patsy Lager T wrote: Reason for CRM: patient stated she was told by her Endocrinologist to increase her metformin to 1 tab in the morning and 1 at night. Patient is running out of medication and she can't get anyone from her Endo providers office to call her back. Patient wants to see if Enid Skeens will write a script and up her dosage. Please f/u with patient

## 2023-12-23 NOTE — Telephone Encounter (Signed)
 Per lab results of Bianca Phillips, - atorvastatin- take 2 tabs 3 days a week and 1 tablet 4 days a week.

## 2023-12-23 NOTE — Telephone Encounter (Signed)
 Patient would like metformin refilled by her ENDO office   Copied from CRM [id number]. Topic: Clinical - Medication Question >> Dec 23, 2023 12:53 PM Patsy Lager T wrote: Reason for CRM: patient stated she was told by her Endocrinologist to increase her metformin to 1 tab in the morning and 1 at night. Patient is running out of medication and she can't get anyone from her Endo providers office to call her back. Patient wants to see if Enid Skeens will write a script and up her dosage. Please f/u with patient

## 2024-01-03 ENCOUNTER — Other Ambulatory Visit: Payer: Self-pay

## 2024-01-03 MED ORDER — LEVOTHYROXINE SODIUM 137 MCG PO TABS: 137.0000 ug | ORAL_TABLET | Freq: Every day | ORAL | 1 refills | Status: DC

## 2024-01-04 ENCOUNTER — Other Ambulatory Visit: Payer: Self-pay

## 2024-01-04 MED ORDER — LEVOTHYROXINE SODIUM 137 MCG PO TABS: 137.0000 ug | ORAL_TABLET | Freq: Every day | ORAL | 0 refills | Status: DC

## 2024-01-09 ENCOUNTER — Other Ambulatory Visit: Payer: Self-pay

## 2024-01-23 ENCOUNTER — Other Ambulatory Visit: Payer: HMO

## 2024-01-24 ENCOUNTER — Other Ambulatory Visit

## 2024-01-24 DIAGNOSIS — R7989 Other specified abnormal findings of blood chemistry: Secondary | ICD-10-CM | POA: Diagnosis not present

## 2024-01-24 DIAGNOSIS — E1165 Type 2 diabetes mellitus with hyperglycemia: Secondary | ICD-10-CM | POA: Diagnosis not present

## 2024-01-25 LAB — REFLEX TIQ

## 2024-01-25 LAB — LIPID PANEL
Cholesterol: 174 mg/dL (ref <200)
HDL: 47 mg/dL — ABNORMAL LOW (ref >=50)
LDL Cholesterol (Calc): 93 mg/dL
Non-HDL Cholesterol (Calc): 127 mg/dL (ref <130)
Total CHOL/HDL Ratio: 3.7 (calc) (ref <5.0)
Triglycerides: 245 mg/dL — ABNORMAL HIGH (ref <150)

## 2024-01-25 LAB — BASIC METABOLIC PANEL WITH GFR
BUN/Creatinine Ratio: 23 (calc) — ABNORMAL HIGH (ref 6–22)
BUN: 28 mg/dL — ABNORMAL HIGH (ref 7–25)
CO2: 29 mmol/L (ref 20–32)
Calcium: 9.6 mg/dL (ref 8.6–10.4)
Chloride: 102 mmol/L (ref 98–110)
Creat: 1.21 mg/dL — ABNORMAL HIGH (ref 0.50–1.05)
Glucose, Bld: 148 mg/dL — ABNORMAL HIGH (ref 65–99)
Potassium: 4.3 mmol/L (ref 3.5–5.3)
Sodium: 139 mmol/L (ref 135–146)
eGFR: 49 mL/min/{1.73_m2} — ABNORMAL LOW (ref >=60)

## 2024-01-25 LAB — T3, FREE: T3, Free: 5 pg/mL — ABNORMAL HIGH (ref 2.3–4.2)

## 2024-01-25 LAB — T4, FREE: Free T4: 3.3 ng/dL — ABNORMAL HIGH (ref 0.8–1.8)

## 2024-01-25 LAB — HEMOGLOBIN A1C
Hgb A1c MFr Bld: 6.9 % — ABNORMAL HIGH (ref <5.7)
Mean Plasma Glucose: 151 mg/dL
eAG (mmol/L): 8.4 mmol/L

## 2024-01-25 LAB — TSH(REFL): TSH: 3.53 m[IU]/L (ref 0.40–4.50)

## 2024-01-30 ENCOUNTER — Other Ambulatory Visit: Payer: Self-pay | Admitting: Nurse Practitioner

## 2024-01-30 ENCOUNTER — Ambulatory Visit: Payer: HMO | Admitting: "Endocrinology

## 2024-01-30 ENCOUNTER — Other Ambulatory Visit: Payer: Self-pay | Admitting: Gastroenterology

## 2024-01-30 ENCOUNTER — Encounter: Payer: Self-pay | Admitting: "Endocrinology

## 2024-01-30 VITALS — BP 140/70 | HR 112 | Ht 65.0 in | Wt 143.0 lb

## 2024-01-30 DIAGNOSIS — E782 Mixed hyperlipidemia: Secondary | ICD-10-CM | POA: Diagnosis not present

## 2024-01-30 DIAGNOSIS — E1165 Type 2 diabetes mellitus with hyperglycemia: Secondary | ICD-10-CM | POA: Diagnosis not present

## 2024-01-30 DIAGNOSIS — R7989 Other specified abnormal findings of blood chemistry: Secondary | ICD-10-CM | POA: Diagnosis not present

## 2024-01-30 DIAGNOSIS — Z7984 Long term (current) use of oral hypoglycemic drugs: Secondary | ICD-10-CM | POA: Diagnosis not present

## 2024-01-30 DIAGNOSIS — K259 Gastric ulcer, unspecified as acute or chronic, without hemorrhage or perforation: Secondary | ICD-10-CM

## 2024-01-30 DIAGNOSIS — K219 Gastro-esophageal reflux disease without esophagitis: Secondary | ICD-10-CM

## 2024-01-30 MED ORDER — LEVOTHYROXINE SODIUM 100 MCG PO TABS: 100.0000 ug | ORAL_TABLET | Freq: Every day | ORAL | 4 refills | Status: DC

## 2024-01-30 NOTE — Progress Notes (Signed)
 Outpatient Endocrinology Note Bianca Newcomer, MD  01/30/24   [name] [birth year] [id number]  Referring Provider: Annella Kief, NP Primary Care Provider: Annella Kief, NP Subjective  No chief complaint on file.   Assessment & Plan  Diagnoses and all orders for this visit:  Abnormal thyroid  blood test -     levothyroxine  (SYNTHROID ) 100 MCG tablet; Take 1 tablet (100 mcg total) by mouth daily. -     TSH -     T3, free -     T4, free  Uncontrolled type 2 diabetes mellitus with hyperglycemia (HCC)  Long term (current) use of oral hypoglycemic drugs  Mixed hypercholesterolemia and hypertriglyceridemia   [name] is currently taking levothyroxine  150mcg with coffee. History of hyperthyroidism (unknown etiology) s/p RAI ablation in young agw Patient is currently biochemically normal TSH with persistently high FT4, FT3 and high heart rate.  Educated on thyroid  axis.  Recommend the following: Decrease levothyroxine  to 137 mcg every morning.  Advised to take levothyroxine  first thing in the morning on empty stomach and wait at least 30 minutes to 1 hour before eating or drinking anything or taking any other medications. Space out levothyroxine  by 4 hours from any acid reflux medication/fibrate/iron/calcium /multivitamin. Advised to take birth control pills and nutritional supplements in the evening. Repeat lab before next visit or sooner if symptoms of hyperthyroidism or hypothyroidism develop.  Notify us  immediately in case of significant weight gain or loss. Counseled on compliance and follow up needs.  C/o diabetes not well controlled, BG can go up to 170s On metformin  1000 mg bid, atorvastatin  40 mg every day (2 pills three days a week), no ACE/ARB Couldn't tolerate farxiga  due to dehydration Watch for GFR (last 49),  sees a nephrologist, deferred to him     I have reviewed current medications, nurse's notes, allergies, vital signs, past medical and  surgical history, family medical history, and social history for this encounter. Counseled patient on symptoms, examination findings, lab findings, imaging results, treatment decisions and monitoring and prognosis. The patient understood the recommendations and agrees with the treatment plan. All questions regarding treatment plan were fully answered.   No follow-ups on file.   Bianca Newcomer, MD  01/30/24   I have reviewed current medications, nurse's notes, allergies, vital signs, past medical and surgical history, family medical history, and social history for this encounter. Counseled patient on symptoms, examination findings, lab findings, imaging results, treatment decisions and monitoring and prognosis. The patient understood the recommendations and agrees with the treatment plan. All questions regarding treatment plan were fully answered.   History of Present Illness Bianca Phillips is a 68 y.o. year old female who presents to our clinic with abnormal thyroid  diagnosed diagnosed around age 50 as well as diabetes.  Fasting BG 120s-150  Feels tired Continues to dance as activity  Taking levothyroxine  with her coffee in the morning On metformin  1000 mg bid- well-tolerated and would like to increase it No other active complaints at this point  Initial history: Patient reports tiredness and itching  Symptoms suggestive of HYPOTHYROIDISM:  fatigue Yes weight gain No cold intolerance  No constipation  Yes, on fiber   Symptoms suggestive of HYPERTHYROIDISM:  weight loss  No heat intolerance No hyperdefecation  No palpitations  Yes  Compressive symptoms:  dysphagia  No dysphonia  No positional dyspnea (especially with simultaneous arms elevation)  No  Smokes  No On biotin  No Personal history of head/neck surgery/irradiation  Yes    Physical Exam  BP (!) 140/70   Pulse (!) 112   Ht 5\' 5"  (1.651 m)   Wt 143 lb (64.9 kg)   SpO2 98%   BMI 23.80 kg/m   Constitutional: well developed, well nourished Head: normocephalic, atraumatic, no exophthalmos Eyes: sclera anicteric, no redness Neck: no thyromegaly, no thyroid  tenderness; no nodules palpated Lungs: normal respiratory effort Neurology: alert and oriented, no fine hand tremor Skin: dry, no appreciable rashes Musculoskeletal: no appreciable defects Psychiatric: normal mood and affect  Allergies Allergies  Allergen Reactions   Curare [Tubocurarine] Other (See Comments)    Per pt was awake and aware that she had paralysis and could not breath    Current Medications Patient's Medications  New Prescriptions   LEVOTHYROXINE  (SYNTHROID ) 100 MCG TABLET    Take 1 tablet (100 mcg total) by mouth daily.  Previous Medications   ALPHA-LIPOIC ACID 600 MG TABS    Take 1 tablet by mouth every evening.   ALPRAZOLAM  (XANAX ) 0.25 MG TABLET    TAKE 1 TABLET BY MOUTH 2 TIMES DAILY AS NEEDED FOR ANXIETY.   ASCORBIC ACID (VITAMIN C) 1000 MG TABLET    Take 1,000 mg by mouth daily.   ATORVASTATIN  (LIPITOR) 40 MG TABLET    Take 2 tabs 3 days a week and 1 tablet 4 days a week.   AZELASTINE  (ASTELIN ) 0.1 % NASAL SPRAY    Place 2 sprays into both nostrils 2 (two) times daily.   BLOOD GLUCOSE MONITORING SUPPL DEVI    1 each by Does not apply route in the morning, at noon, and at bedtime. May substitute to any manufacturer covered by patient's insurance.   CALCIUM  CARBONATE-VITAMIN D  (CALCIUM -VITAMIN D  PO)    Take 1 tablet by mouth daily.   CETIRIZINE (ZYRTEC) 10 MG TABLET    Take 10 mg by mouth daily.   CHOLECALCIFEROL  (VITAMIN D3) 25 MCG (1000 UNIT) TABLET    Take 1,000 Units by mouth daily.   DOCUSATE SODIUM  (COLACE) 100 MG CAPSULE    Take 200 mg by mouth daily.   LANCETS (ONETOUCH DELICA PLUS LANCET30G) MISC       MAGNESIUM 250 MG TABS    Take 1 tablet by mouth daily.   METFORMIN  (GLUCOPHAGE ) 1000 MG TABLET    TAKE 1 TABLET BY MOUTH EVERY DAY WITH BREAKFAST   MISC NATURAL PRODUCTS (GLUCOSAMINE CHOND CMP  ADVANCED PO)    Take 2 capsules by mouth every evening.   NORTRIPTYLINE  (PAMELOR ) 50 MG CAPSULE    TAKE 1 CAPSULE (50 MG) BY MOUTH AT BEDTIME.   OMEGA-3 FATTY ACIDS (OMEGA-3 FISH OIL PO)    Take 2,000 capsules by mouth every evening.   ONETOUCH ULTRA TEST STRIP       PANTOPRAZOLE  (PROTONIX ) 40 MG TABLET    Take 1 tablet (40 mg total) by mouth 2 (two) times daily. Two times  daily for 8 weeks then once daily.   PANTOPRAZOLE  (PROTONIX ) 40 MG TABLET    Take 1 tablet (40 mg total) by mouth daily.   PROBIOTIC PRODUCT (PROBIOTIC DAILY) CAPS    Take 1 capsule by mouth every evening.   SERTRALINE  (ZOLOFT ) 50 MG TABLET    TAKE 1 TABLET BY MOUTH EVERY DAY   TRIMETHOPRIM  (TRIMPEX ) 100 MG TABLET    TAKE 1 TABLET BY MOUTH EVERYDAY AT BEDTIME   TURMERIC 500 MG CAPS    Take 1 capsule by mouth every evening.  Modified Medications   No medications on file  Discontinued Medications   LEVOTHYROXINE  (SYNTHROID ) 137 MCG TABLET    Take 1 tablet (137 mcg total) by mouth daily before breakfast.    Past Medical History Past Medical History:  Diagnosis Date   Allergy    Complication of anesthesia    Constipation    GAD (generalized anxiety disorder)    GERD (gastroesophageal reflux disease)    Heart murmur    Heart palpitations    evaluated by cardiologist--- dr h. pemberton, note in epic 04-03-2021, normal echo and monitor showed NSR w/ SVT   History of COVID-19    per pt had covid twice in 12/ 2020 with mild symptoms with residual intermittant loss of taste;  and 04/ 2022 with mild symptoms that resolved   History of hyperthyroidism    age 66 s/p RAI   History of kidney stones    History of recurrent UTIs    History of repair of hiatal hernia 02/2020   Hyperlipidemia    Hypothyroidism, postablative    age 23  s/p RAI for hyperthyroidism;  followed by pcp   IDA (iron deficiency anemia)    MDD (major depressive disorder)    OA (osteoarthritis)    Right ureteral calculus    Seasonal allergic rhinitis     Type 2 diabetes mellitus (HCC)    followed by pcp    (07-14-2021  per pt checks blood sugar at home 2-3 times weekly,  fasting sugar --120-130)   Urgency of urination    Wears hearing aid in both ears     Past Surgical History Past Surgical History:  Procedure Laterality Date   CESAREAN SECTION     x2  last one 1986   CYSTOSCOPY/URETEROSCOPY/HOLMIUM LASER/STENT PLACEMENT  2012   approx   CYSTOSCOPY/URETEROSCOPY/HOLMIUM LASER/STENT PLACEMENT Right 07/20/2021   Procedure: CYSTOSCOPY/RETROGRADE/URETEROSCOPY/HOLMIUM LASER/STENT PLACEMENT;  Surgeon: Lahoma Pigg, MD;  Location: [facility];  Service: Urology;  Laterality: Right;  ONLY NEEDS 45 MIN   LAPAROSCOPIC NISSEN FUNDOPLICATION  02/2020   TOTAL ABDOMINAL HYSTERECTOMY W/ BILATERAL SALPINGOOPHORECTOMY  2002   TOTAL HIP ARTHROPLASTY Right 08/30/2021   Procedure: TOTAL HIP ARTHROPLASTY ANTERIOR APPROACH;  Surgeon: Arnie Lao, MD;  Location: [facility] OR;  Service: Orthopedics;  Laterality: Right;   UPPER GASTROINTESTINAL ENDOSCOPY      Family History family history includes Diabetes in her father and mother.  Social History Social History   Socioeconomic History   Marital status: Divorced    Spouse name: Not on file   Number of children: 3   Years of education: 16   Highest education level: Bachelor's degree (e.g., BA, AB, BS)  Occupational History   Occupation: retired    Comment: Runner, broadcasting/film/video  Tobacco Use   Smoking status: Former    Current packs/day: 0.00    Average packs/day: 0.5 packs/day for 10.0 years (5.0 ttl pk-yrs)    Types: Cigarettes    Start date: 25    Quit date: 1988    Years since quitting: 37.3   Smokeless tobacco: Never   Tobacco comments:    35 years ago  Vaping Use   Vaping status: Never Used  Substance and Sexual Activity   Alcohol use: Never   Drug use: Never   Sexual activity: Not Currently    Birth control/protection: Post-menopausal, Surgical  Other Topics Concern    Not on file  Social History Narrative   Not on file   Social Drivers of Health   Financial Resource Strain: Low Risk  (11/01/2023)  Overall Financial Resource Strain (CARDIA)    Difficulty of Paying Living Expenses: Not hard at all  Food Insecurity: No Food Insecurity (11/01/2023)   Hunger Vital Sign    Worried About Running Out of Food in the Last Year: Never true    Ran Out of Food in the Last Year: Never true  Transportation Needs: No Transportation Needs (11/01/2023)   PRAPARE - Administrator, Civil Service (Medical): No    Lack of Transportation (Non-Medical): No  Physical Activity: Sufficiently Active (11/01/2023)   Exercise Vital Sign    Days of Exercise per Week: 4 days    Minutes of Exercise per Session: 60 min  Stress: No Stress Concern Present (11/01/2023)   [employer] of Occupational Health - Occupational Stress Questionnaire    Feeling of Stress : Not at all  Social Connections: Moderately Integrated (11/01/2023)   Social Connection and Isolation Panel [NHANES]    Frequency of Communication with Friends and Family: More than three times a week    Frequency of Social Gatherings with Friends and Family: More than three times a week    Attends Religious Services: More than 4 times per year    Active Member of Clubs or Organizations: Yes    Attends Banker Meetings: More than 4 times per year    Marital Status: Divorced  Intimate Partner Violence: Not At Risk (11/01/2023)   Humiliation, Afraid, Rape, and Kick questionnaire    Fear of Current or Ex-Partner: No    Emotionally Abused: No    Physically Abused: No    Sexually Abused: No    Laboratory Investigations Lab Results  Component Value Date   TSH 3.97 09/15/2023   TSH 4.850 (H) 06/24/2023   TSH 3.890 10/27/2022   FREET4 3.3 (H) 01/24/2024   FREET4 2.6 (H) 09/15/2023   FREET4 3.16 (H) 06/24/2023     Lab Results  Component Value Date   TSI <89 09/20/2023     No components found  for: "TRAB"   Lab Results  Component Value Date   CHOL 174 01/24/2024   Lab Results  Component Value Date   HDL 47 (L) 01/24/2024   Lab Results  Component Value Date   LDLCALC 93 01/24/2024   Lab Results  Component Value Date   TRIG 245 (H) 01/24/2024   Lab Results  Component Value Date   CHOLHDL 3.7 01/24/2024   Lab Results  Component Value Date   CREATININE 1.21 (H) 01/24/2024   No results found for: "GFR"    Component Value Date/Time   NA 139 01/24/2024 0935   NA 138 06/24/2023 1000   K 4.3 01/24/2024 0935   CL 102 01/24/2024 0935   CO2 29 01/24/2024 0935   GLUCOSE 148 (H) 01/24/2024 0935   BUN 28 (H) 01/24/2024 0935   BUN 20 06/24/2023 1000   CREATININE 1.21 (H) 01/24/2024 0935   CALCIUM  9.6 01/24/2024 0935   PROT 6.6 06/24/2023 1000   ALBUMIN  4.4 06/24/2023 1000   AST 21 06/24/2023 1000   ALT 22 06/24/2023 1000   ALKPHOS 113 06/24/2023 1000   BILITOT 0.4 06/24/2023 1000   GFRNONAA 54 (L) 03/12/2022 0415      Latest Ref Rng & Units 01/24/2024    9:35 AM 06/24/2023   10:00 AM 10/27/2022   11:11 AM  BMP  Glucose 65 - 99 mg/dL 161  096  045   BUN 7 - 25 mg/dL 28  20  26    Creatinine  0.50 - 1.05 mg/dL 6.21  3.08  6.57   BUN/Creat Ratio 6 - 22 (calc) 23  18  21    Sodium 135 - 146 mmol/L 139  138  143   Potassium 3.5 - 5.3 mmol/L 4.3  4.5  4.9   Chloride 98 - 110 mmol/L 102  101  103   CO2 20 - 32 mmol/L 29  23  25    Calcium  8.6 - 10.4 mg/dL 9.6  9.4  84.6        Component Value Date/Time   WBC 5.0 06/24/2023 1000   WBC 7.8 03/12/2022 0415   RBC 4.58 06/24/2023 1000   RBC 4.80 03/12/2022 0415   HGB 13.5 06/24/2023 1000   HCT 42.8 06/24/2023 1000   PLT 247 06/24/2023 1000   MCV 93 06/24/2023 1000   MCH 29.5 06/24/2023 1000   MCH 29.0 03/12/2022 0415   MCHC 31.5 06/24/2023 1000   MCHC 32.6 03/12/2022 0415   RDW 12.5 06/24/2023 1000   LYMPHSABS 2.1 06/24/2023 1000   MONOABS 0.5 08/29/2021 1213   EOSABS 0.2 06/24/2023 1000   BASOSABS 0.0  06/24/2023 1000      Parts of this note may have been dictated using voice recognition software. There may be variances in spelling and vocabulary which are unintentional. Not all errors are proofread. Please notify the [facility] if any discrepancies are noted or if the meaning of any statement is not clear.

## 2024-02-28 ENCOUNTER — Other Ambulatory Visit: Payer: Self-pay | Admitting: Family Medicine

## 2024-02-28 ENCOUNTER — Other Ambulatory Visit: Payer: Self-pay | Admitting: Nurse Practitioner

## 2024-02-28 DIAGNOSIS — J302 Other seasonal allergic rhinitis: Secondary | ICD-10-CM

## 2024-03-14 ENCOUNTER — Other Ambulatory Visit: Payer: Self-pay

## 2024-03-17 ENCOUNTER — Other Ambulatory Visit: Payer: Self-pay | Admitting: Medical

## 2024-03-17 DIAGNOSIS — E1165 Type 2 diabetes mellitus with hyperglycemia: Secondary | ICD-10-CM

## 2024-03-17 DIAGNOSIS — E782 Mixed hyperlipidemia: Secondary | ICD-10-CM

## 2024-03-19 NOTE — Telephone Encounter (Signed)
Sent in a 30-day supply.

## 2024-03-20 ENCOUNTER — Other Ambulatory Visit

## 2024-03-20 DIAGNOSIS — R7989 Other specified abnormal findings of blood chemistry: Secondary | ICD-10-CM | POA: Diagnosis not present

## 2024-03-20 DIAGNOSIS — E0789 Other specified disorders of thyroid: Secondary | ICD-10-CM | POA: Diagnosis not present

## 2024-03-21 LAB — T4, FREE: Free T4: 3 ng/dL — ABNORMAL HIGH (ref 0.8–1.8)

## 2024-03-21 LAB — TSH: TSH: 5.05 m[IU]/L — ABNORMAL HIGH (ref 0.40–4.50)

## 2024-03-21 LAB — T3, FREE: T3, Free: 5 pg/mL — ABNORMAL HIGH (ref 2.3–4.2)

## 2024-03-26 ENCOUNTER — Ambulatory Visit: Admitting: "Endocrinology

## 2024-03-26 ENCOUNTER — Encounter: Payer: Self-pay | Admitting: "Endocrinology

## 2024-03-26 VITALS — BP 140/70 | HR 105 | Ht 65.0 in | Wt 140.0 lb

## 2024-03-26 DIAGNOSIS — Z7984 Long term (current) use of oral hypoglycemic drugs: Secondary | ICD-10-CM | POA: Diagnosis not present

## 2024-03-26 DIAGNOSIS — E782 Mixed hyperlipidemia: Secondary | ICD-10-CM

## 2024-03-26 DIAGNOSIS — E1165 Type 2 diabetes mellitus with hyperglycemia: Secondary | ICD-10-CM

## 2024-03-26 DIAGNOSIS — E0789 Other specified disorders of thyroid: Secondary | ICD-10-CM

## 2024-03-26 LAB — POCT GLYCOSYLATED HEMOGLOBIN (HGB A1C): Hemoglobin A1C: 6.5 % — AB (ref 4.0–5.6)

## 2024-03-26 NOTE — Progress Notes (Signed)
 Outpatient Endocrinology Note Obadiah Birmingham, MD  03/26/24   Barnie DELENA Olmsted 06/25/1956 [id number]  Referring Provider: Oris Camie BRAVO, NP Primary Care Provider: Oris Camie BRAVO, NP Subjective  No chief complaint on file.   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 History of hyperthyroidism (unknown etiology) s/p RAI ablation in young age in 55s due to palpitations  Patient is currently biochemically normal TSH with persistently high FT4, FT3 and high heart rate.  Educated on thyroid  axis.  Recommend the following: continue levothyroxine  to 100 mcg every morning.  Advised to take levothyroxine  first thing in the morning on empty stomach and wait at least 30 minutes to 1 hour before eating or drinking anything or taking any other medications. Space out levothyroxine  by 4 hours from any acid reflux medication/fibrate/iron/calcium /multivitamin. Advised to take birth control pills and nutritional supplements in the evening. Repeat lab before next visit or sooner if symptoms of hyperthyroidism or hypothyroidism develop.  Notify us  immediately in case of significant weight gain or loss. Counseled on compliance and follow up needs.  Suspected resistance to thyroid  hormone given high to normal TSH, high free T4, high free T3 Given course to the patient to check with her insurance if genetic testing for thyroid  hormone resistance will be covered, can be done under [facility] Need to rule out other differential diagnoses, need to figure out if insurance would cover those tests: alpha subunit of TSH to rule out pituitary adenoma and T4-binding panel, which measures T4 binding to serum albumin , TBG, and transthyretin to r/o Thyroid  hormone binding abnormalities    C/o diabetes not well controlled, BG can go up to 170s On metformin  1000 mg bid, atorvastatin  40 mg every day (2 pills three days a week), no ACE/ARB Couldn't tolerate farxiga  due to dehydration Watch for GFR (last 49),  sees a nephrologist, deferred to him     I have reviewed current medications, nurse's notes, allergies, vital signs, past medical and surgical history, family medical history, and social history for this encounter. Counseled patient on symptoms, examination findings, lab findings, imaging results, treatment decisions and monitoring and prognosis. The patient understood the recommendations and agrees with the treatment plan. All questions regarding treatment plan were fully answered.   Return in about 3 months (around 06/26/2024).   Obadiah Birmingham, MD  03/26/24   I have reviewed current medications, nurse's notes, allergies, vital signs, past medical and surgical history, family medical history, and social history for this encounter. Counseled patient on symptoms, examination findings, lab findings, imaging results, treatment decisions and monitoring and prognosis. The patient understood the recommendations and agrees with the treatment plan. All questions regarding treatment plan were fully answered.   History of Present Illness Bianca Phillips is a 68 y.o. year old female who presents to our clinic with abnormal thyroid  diagnosed diagnosed around age 35 as well as diabetes.  Patient reports her only symptom that led to her radioactive iodine ablation was her palpitation, which still happens and she has been checked with her cardiologist She denies any other thyroid  symptoms Taking levothyroxine  appropriately  On metformin  1000 mg bid- well-tolerated and would like to increase it No other active complaints at this point  Initial history: Patient reports tiredness and itching  Symptoms  suggestive of HYPOTHYROIDISM:  fatigue Yes weight gain No cold intolerance   No constipation  Yes, on fiber   Symptoms suggestive of HYPERTHYROIDISM:  weight loss  No heat intolerance No hyperdefecation  No palpitations  Yes  Compressive symptoms:  dysphagia  No dysphonia  No positional dyspnea (especially with simultaneous arms elevation)  No  Smokes  No On biotin  No Personal history of head/neck surgery/irradiation  Yes    Physical Exam  BP (!) 140/70   Pulse (!) 105   Ht 5' 5 (1.651 m)   Wt 140 lb (63.5 kg)   SpO2 98%   BMI 23.30 kg/m  Constitutional: well developed, well nourished Head: normocephalic, atraumatic, no exophthalmos Eyes: sclera anicteric, no redness Neck: no thyromegaly, no thyroid  tenderness; no nodules palpated Lungs: normal respiratory effort Neurology: alert and oriented, no fine hand tremor Skin: dry, no appreciable rashes Musculoskeletal: no appreciable defects Psychiatric: normal mood and affect  Allergies Allergies  Allergen Reactions   Curare [Tubocurarine] Other (See Comments)    Per pt was awake and aware that she had paralysis and could not breath    Current Medications Patient's Medications  New Prescriptions   No medications on file  Previous Medications   ALPHA-LIPOIC ACID 600 MG TABS    Take 1 tablet by mouth every evening.   ALPRAZOLAM  (XANAX ) 0.25 MG TABLET    TAKE 1 TABLET BY MOUTH 2 TIMES DAILY AS NEEDED FOR ANXIETY.   ASCORBIC ACID (VITAMIN C) 1000 MG TABLET    Take 1,000 mg by mouth daily.   ATORVASTATIN  (LIPITOR) 40 MG TABLET    TAKE 2 TABLETS BY MOUTH 3 DAYS A WEEK AND 1 TABLET 4 DAYS A WEEK.   AZELASTINE  HCL 137 MCG/SPRAY SOLN    PLACE 2 SPRAYS INTO BOTH NOSTRILS 2 (TWO) TIMES DAILY   BLOOD GLUCOSE MONITORING SUPPL DEVI    1 each by Does not apply route in the morning, at noon, and at bedtime. May substitute to any manufacturer covered by patient's insurance.   CALCIUM  CARBONATE-VITAMIN D  (CALCIUM -VITAMIN D  PO)    Take 1 tablet by mouth daily.   CETIRIZINE (ZYRTEC) 10 MG TABLET    Take 10 mg  by mouth daily.   CHOLECALCIFEROL  (VITAMIN D3) 25 MCG (1000 UNIT) TABLET    Take 1,000 Units by mouth daily.   DOCUSATE SODIUM  (COLACE) 100 MG CAPSULE    Take 200 mg by mouth daily.   LANCETS (ONETOUCH DELICA PLUS LANCET30G) MISC       LEVOTHYROXINE  (SYNTHROID ) 100 MCG TABLET    Take 1 tablet (100 mcg total) by mouth daily.   MAGNESIUM 250 MG TABS    Take 1 tablet by mouth daily.   METFORMIN  (GLUCOPHAGE ) 1000 MG TABLET    TAKE 1 TABLET BY MOUTH EVERY DAY WITH BREAKFAST   MISC NATURAL PRODUCTS (GLUCOSAMINE CHOND CMP ADVANCED PO)    Take 2 capsules by mouth every evening.   NORTRIPTYLINE  (PAMELOR ) 50 MG CAPSULE    TAKE 1 CAPSULE (50 MG) BY MOUTH AT BEDTIME.   OMEGA-3 FATTY ACIDS (OMEGA-3 FISH OIL PO)    Take 2,000 capsules by mouth every evening.   ONETOUCH ULTRA TEST STRIP       PANTOPRAZOLE  (PROTONIX ) 40 MG TABLET    Take 1 tablet (40 mg total) by mouth 2 (two) times daily. Two times  daily for 8 weeks then once daily.   PANTOPRAZOLE  (PROTONIX ) 40 MG TABLET    Take 1 tablet (40 mg total) by mouth  daily.   PROBIOTIC PRODUCT (PROBIOTIC DAILY) CAPS    Take 1 capsule by mouth every evening.   SERTRALINE  (ZOLOFT ) 50 MG TABLET    TAKE 1 TABLET BY MOUTH EVERY DAY   TRIMETHOPRIM  (TRIMPEX ) 100 MG TABLET    TAKE 1 TABLET BY MOUTH EVERYDAY AT BEDTIME   TURMERIC 500 MG CAPS    Take 1 capsule by mouth every evening.  Modified Medications   No medications on file  Discontinued Medications   No medications on file    Past Medical History Past Medical History:  Diagnosis Date   Allergy    Complication of anesthesia    Constipation    GAD (generalized anxiety disorder)    GERD (gastroesophageal reflux disease)    Heart murmur    Heart palpitations    evaluated by cardiologist--- dr h. hobart, note in epic 04-03-2021, normal echo and monitor showed NSR w/ SVT   History of COVID-19    per pt had covid twice in 12/ 2020 with mild symptoms with residual intermittant loss of taste;  and 04/ 2022 with  mild symptoms that resolved   History of hyperthyroidism    age 1 s/p RAI   History of kidney stones    History of recurrent UTIs    History of repair of hiatal hernia 02/2020   Hyperlipidemia    Hypothyroidism, postablative    age 89  s/p RAI for hyperthyroidism;  followed by pcp   IDA (iron deficiency anemia)    MDD (major depressive disorder)    OA (osteoarthritis)    Right ureteral calculus    Seasonal allergic rhinitis    Type 2 diabetes mellitus (HCC)    followed by pcp    (07-14-2021  per pt checks blood sugar at home 2-3 times weekly,  fasting sugar --120-130)   Urgency of urination    Wears hearing aid in both ears     Past Surgical History Past Surgical History:  Procedure Laterality Date   CESAREAN SECTION     x2  last one 1986   CYSTOSCOPY/URETEROSCOPY/HOLMIUM LASER/STENT PLACEMENT  2012   approx   CYSTOSCOPY/URETEROSCOPY/HOLMIUM LASER/STENT PLACEMENT Right 07/20/2021   Procedure: CYSTOSCOPY/RETROGRADE/URETEROSCOPY/HOLMIUM LASER/STENT PLACEMENT;  Surgeon: Selma Donnice SAUNDERS, MD;  Location: [facility];  Service: Urology;  Laterality: Right;  ONLY NEEDS 45 MIN   LAPAROSCOPIC NISSEN FUNDOPLICATION  02/2020   TOTAL ABDOMINAL HYSTERECTOMY W/ BILATERAL SALPINGOOPHORECTOMY  2002   TOTAL HIP ARTHROPLASTY Right 08/30/2021   Procedure: TOTAL HIP ARTHROPLASTY ANTERIOR APPROACH;  Surgeon: Vernetta Lonni GRADE, MD;  Location: [facility] OR;  Service: Orthopedics;  Laterality: Right;   UPPER GASTROINTESTINAL ENDOSCOPY      Family History family history includes Diabetes in her father and mother.  Social History Social History   Socioeconomic History   Marital status: Divorced    Spouse name: Not on file   Number of children: 3   Years of education: 16   Highest education level: Bachelor's degree (e.g., BA, AB, BS)  Occupational History   Occupation: retired    Comment: Runner, broadcasting/film/video  Tobacco Use   Smoking status: Former    Current packs/day: 0.00    Average  packs/day: 0.5 packs/day for 10.0 years (5.0 ttl pk-yrs)    Types: Cigarettes    Start date: 74    Quit date: 1988    Years since quitting: 37.5   Smokeless tobacco: Never   Tobacco comments:    35 years ago  Vaping Use   Vaping status: Never  Used  Substance and Sexual Activity   Alcohol use: Never   Drug use: Never   Sexual activity: Not Currently    Birth control/protection: Post-menopausal, Surgical  Other Topics Concern   Not on file  Social History Narrative   Not on file   Social Drivers of Health   Financial Resource Strain: Low Risk  (11/01/2023)   Overall Financial Resource Strain (CARDIA)    Difficulty of Paying Living Expenses: Not hard at all  Food Insecurity: No Food Insecurity (11/01/2023)   Hunger Vital Sign    Worried About Running Out of Food in the Last Year: Never true    Ran Out of Food in the Last Year: Never true  Transportation Needs: No Transportation Needs (11/01/2023)   PRAPARE - Administrator, Civil Service (Medical): No    Lack of Transportation (Non-Medical): No  Physical Activity: Sufficiently Active (11/01/2023)   Exercise Vital Sign    Days of Exercise per Week: 4 days    Minutes of Exercise per Session: 60 min  Stress: No Stress Concern Present (11/01/2023)   [employer] of Occupational Health - Occupational Stress Questionnaire    Feeling of Stress : Not at all  Social Connections: Moderately Integrated (11/01/2023)   Social Connection and Isolation Panel    Frequency of Communication with Friends and Family: More than three times a week    Frequency of Social Gatherings with Friends and Family: More than three times a week    Attends Religious Services: More than 4 times per year    Active Member of Clubs or Organizations: Yes    Attends Banker Meetings: More than 4 times per year    Marital Status: Divorced  Intimate Partner Violence: Not At Risk (11/01/2023)   Humiliation, Afraid, Rape, and Kick  questionnaire    Fear of Current or Ex-Partner: No    Emotionally Abused: No    Physically Abused: No    Sexually Abused: No    Laboratory Investigations Lab Results  Component Value Date   TSH 5.05 (H) 03/20/2024   TSH 3.97 09/15/2023   TSH 4.850 (H) 06/24/2023   FREET4 3.0 (H) 03/20/2024   FREET4 3.3 (H) 01/24/2024   FREET4 2.6 (H) 09/15/2023     Lab Results  Component Value Date   TSI <89 09/20/2023     No components found for: TRAB   Lab Results  Component Value Date   CHOL 174 01/24/2024   Lab Results  Component Value Date   HDL 47 (L) 01/24/2024   Lab Results  Component Value Date   LDLCALC 93 01/24/2024   Lab Results  Component Value Date   TRIG 245 (H) 01/24/2024   Lab Results  Component Value Date   CHOLHDL 3.7 01/24/2024   Lab Results  Component Value Date   CREATININE 1.21 (H) 01/24/2024   No results found for: GFR    Component Value Date/Time   NA 139 01/24/2024 0935   NA 138 06/24/2023 1000   K 4.3 01/24/2024 0935   CL 102 01/24/2024 0935   CO2 29 01/24/2024 0935   GLUCOSE 148 (H) 01/24/2024 0935   BUN 28 (H) 01/24/2024 0935   BUN 20 06/24/2023 1000   CREATININE 1.21 (H) 01/24/2024 0935   CALCIUM  9.6 01/24/2024 0935   PROT 6.6 06/24/2023 1000   ALBUMIN  4.4 06/24/2023 1000   AST 21 06/24/2023 1000   ALT 22 06/24/2023 1000   ALKPHOS 113 06/24/2023 1000   BILITOT  0.4 06/24/2023 1000   GFRNONAA 54 (L) 03/12/2022 0415      Latest Ref Rng & Units 01/24/2024    9:35 AM 06/24/2023   10:00 AM 10/27/2022   11:11 AM  BMP  Glucose 65 - 99 mg/dL 851  844  837   BUN 7 - 25 mg/dL 28  20  26    Creatinine 0.50 - 1.05 mg/dL 8.78  8.87  8.76   BUN/Creat Ratio 6 - 22 (calc) 23  18  21    Sodium 135 - 146 mmol/L 139  138  143   Potassium 3.5 - 5.3 mmol/L 4.3  4.5  4.9   Chloride 98 - 110 mmol/L 102  101  103   CO2 20 - 32 mmol/L 29  23  25    Calcium  8.6 - 10.4 mg/dL 9.6  9.4  89.8        Component Value Date/Time   WBC 5.0 06/24/2023 1000    WBC 7.8 03/12/2022 0415   RBC 4.58 06/24/2023 1000   RBC 4.80 03/12/2022 0415   HGB 13.5 06/24/2023 1000   HCT 42.8 06/24/2023 1000   PLT 247 06/24/2023 1000   MCV 93 06/24/2023 1000   MCH 29.5 06/24/2023 1000   MCH 29.0 03/12/2022 0415   MCHC 31.5 06/24/2023 1000   MCHC 32.6 03/12/2022 0415   RDW 12.5 06/24/2023 1000   LYMPHSABS 2.1 06/24/2023 1000   MONOABS 0.5 08/29/2021 1213   EOSABS 0.2 06/24/2023 1000   BASOSABS 0.0 06/24/2023 1000      Parts of this note may have been dictated using voice recognition software. There may be variances in spelling and vocabulary which are unintentional. Not all errors are proofread. Please notify the dino if any discrepancies are noted or if the meaning of any statement is not clear.

## 2024-03-27 ENCOUNTER — Telehealth: Payer: Self-pay

## 2024-03-27 NOTE — Telephone Encounter (Signed)
 Pt called of and said that her insurance company said that Dr Dartha would have to call and talk to the insurance company to see if genetic testing for thyroid  hormone resistance will be covered. Phone number for pt insurance [phone]

## 2024-03-29 ENCOUNTER — Ambulatory Visit: Payer: Self-pay

## 2024-03-29 ENCOUNTER — Encounter: Payer: Self-pay | Admitting: Family Medicine

## 2024-03-29 ENCOUNTER — Ambulatory Visit: Admitting: Family Medicine

## 2024-03-29 VITALS — BP 130/78 | HR 108 | Temp 97.6°F | Wt 140.0 lb

## 2024-03-29 DIAGNOSIS — J069 Acute upper respiratory infection, unspecified: Secondary | ICD-10-CM | POA: Diagnosis not present

## 2024-03-29 DIAGNOSIS — J302 Other seasonal allergic rhinitis: Secondary | ICD-10-CM | POA: Diagnosis not present

## 2024-03-29 NOTE — Progress Notes (Signed)
   Subjective:    Patient ID: Bianca Phillips, female    DOB: [birth year], 69 y.o.   MRN: [id number]  HPI She is states that 1 week ago she developed a cough followed by a slight sore throat with headache but no fever, chills, earache.  She does not smoke.  She does have seasonal allergies.   Review of Systems     Objective:    Physical Exam Alert and in no distress. Tympanic membranes and canals are normal. Pharyngeal area is normal. Neck is supple without adenopathy or thyromegaly. Cardiac exam shows a regular sinus rhythm without murmurs or gallops. Lungs are clear to auscultation.        Assessment & Plan:  Viral URI with cough  Seasonal allergies Explained to her that this is most likely a viral URI and supportive care would be the appropriate way to handle it.  She was comfortable with that.  Recommend NyQuil at night.  Discussed issues concerning when to call she has more difficulty.

## 2024-03-29 NOTE — Telephone Encounter (Signed)
 FYI Only or Action Required?: Action required by provider: request for appointment.  Patient was last seen in primary care on 06/20/2023 by Early, Camie BRAVO, NP. Called Nurse Triage reporting Cough. Symptoms began a week ago. Interventions attempted: Nothing. Symptoms are: gradually worsening.  Triage Disposition: See Physician Within 24 Hours  Patient/caregiver understands and will follow disposition?: yes  Copied from CRM [id number]. Topic: Clinical - Red Word Triage >> Mar 29, 2024 11:59 AM Graeme ORN wrote: Red Word that prompted transfer to Nurse Triage: cough, possible pneumonia, green mucus    ----------------------------------------------------------------------- From previous Reason for Contact - Scheduling: Patient/patient representative is calling to schedule an appointment. Refer to attachments for appointment information. Reason for Disposition  SEVERE coughing spells (e.g., whooping sound after coughing, vomiting after coughing)  Answer Assessment - Initial Assessment Questions 1. ONSET: When did the cough begin?      A week 2. SEVERITY: How bad is the cough today?      Moderate to severe 3. SPUTUM: Describe the color of your sputum (none, dry cough; clear, white, yellow, green)     green 4. HEMOPTYSIS: Are you coughing up any blood? If so ask: How much? (flecks, streaks, tablespoons, etc.)     denies 5. DIFFICULTY BREATHING: Are you having difficulty breathing? If Yes, ask: How bad is it? (e.g., mild, moderate, severe)    - MILD: No SOB at rest, mild SOB with walking, speaks normally in sentences, can lie down, no retractions, pulse < 100.    - MODERATE: SOB at rest, SOB with minimal exertion and prefers to sit, cannot lie down flat, speaks in phrases, mild retractions, audible wheezing, pulse 100-120.    - SEVERE: Very SOB at rest, speaks in single words, struggling to breathe, sitting hunched forward, retractions, pulse > 120      denies 6. FEVER: Do you have  a fever? If Yes, ask: What is your temperature, how was it measured, and when did it start?     denies 7. CARDIAC HISTORY: Do you have any history of heart disease? (e.g., heart attack, congestive heart failure)      My heart beats fast 8. LUNG HISTORY: Do you have any history of lung disease?  (e.g., pulmonary embolus, asthma, emphysema)     Pneumonia,  9. PE RISK FACTORS: Do you have a history of blood clots? (or: recent major surgery, recent prolonged travel, bedridden)     denies 10. OTHER SYMPTOMS: Do you have any other symptoms? (e.g., runny nose, wheezing, chest pain)       Sore throat 12. TRAVEL: Have you traveled out of the country in the last month? (e.g., travel history, exposures)       denies  Protocols used: Cough - Acute Productive-A-AH

## 2024-03-29 NOTE — Telephone Encounter (Signed)
 Appointment scheduled with Dr. Joyce for today

## 2024-04-05 IMAGING — CT CT RENAL STONE PROTOCOL
2 of 4 series · 16 of 46 positions shown, 18 images · non-contrast
Comparison: 07/01/2021 abdominal CT

CLINICAL DATA: Flank pain with kidney stone suspected



[Series 2: stone full · axial · 0.74mm/px · z∈[-382,+28]mm · 13 of 90 slices shown, 15 images]
[im 4/90  soft-tissue]
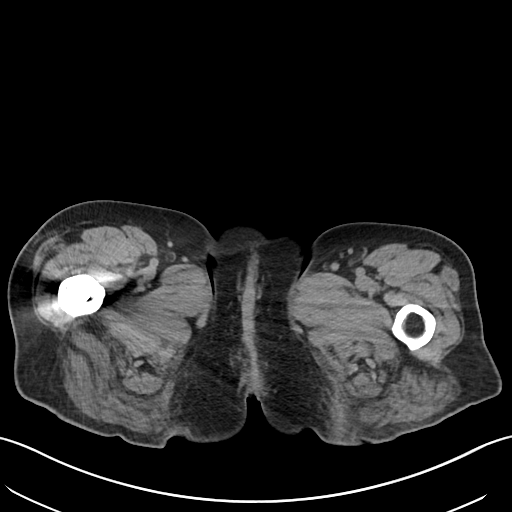
[im 4/90  bone]
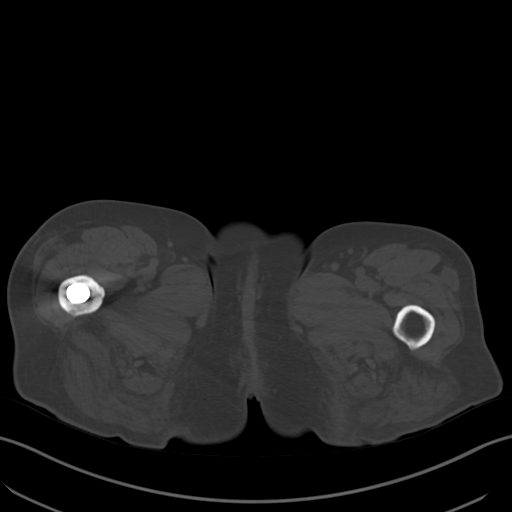
[im 12/90  soft-tissue]
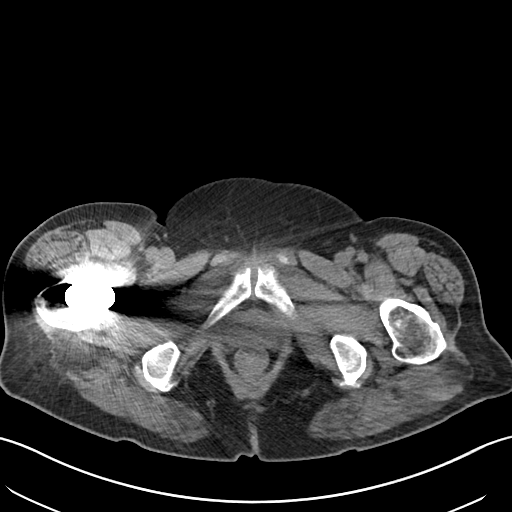
[im 19/90  soft-tissue]
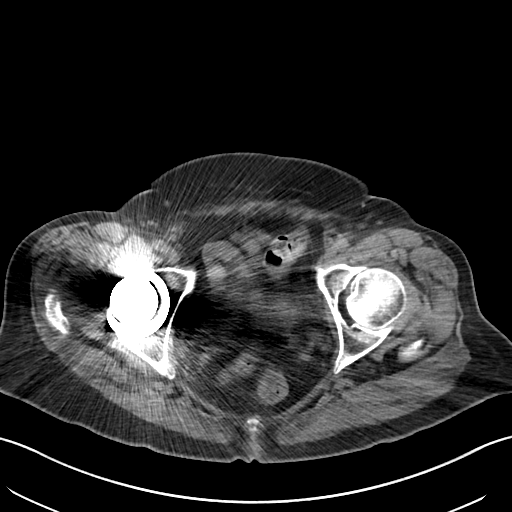
[im 26/90  soft-tissue]
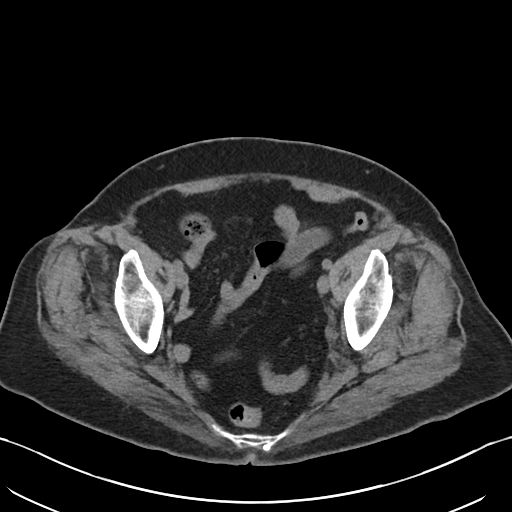
[im 30/90  soft-tissue]
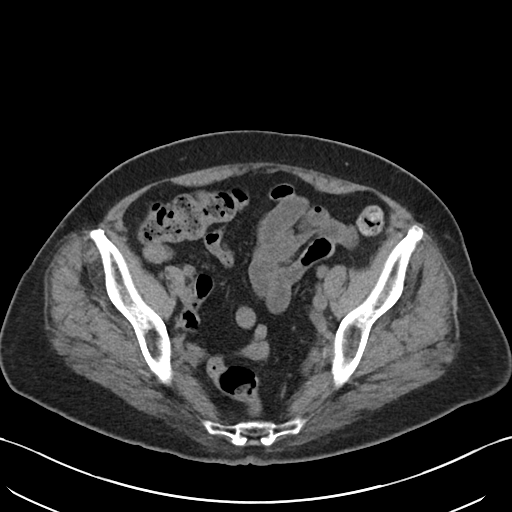
[im 38/90  soft-tissue]
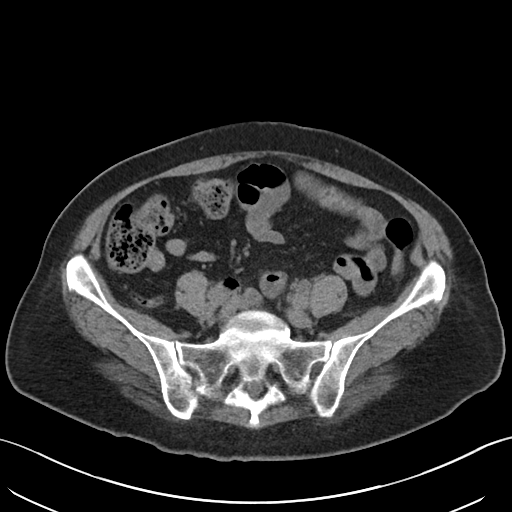
[im 45/90  soft-tissue]
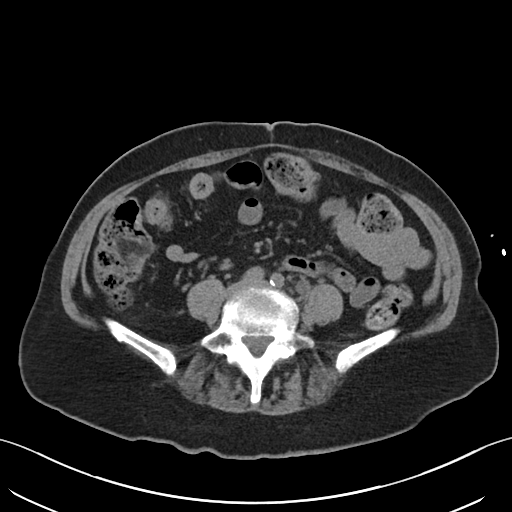
[im 52/90  soft-tissue]
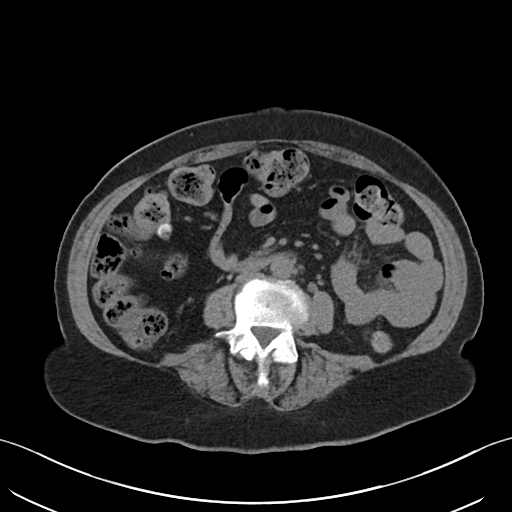
[im 60/90  soft-tissue]
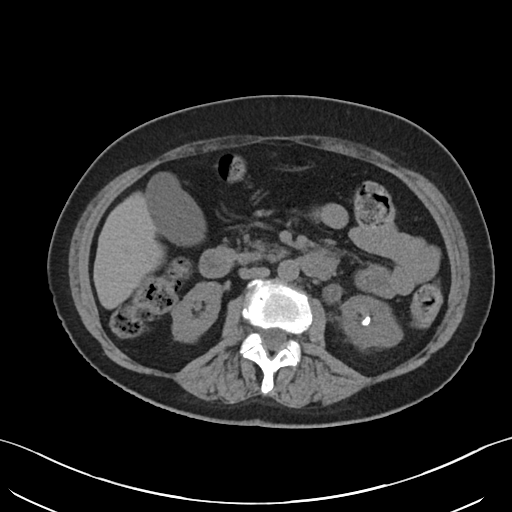
[im 60/90  bone]
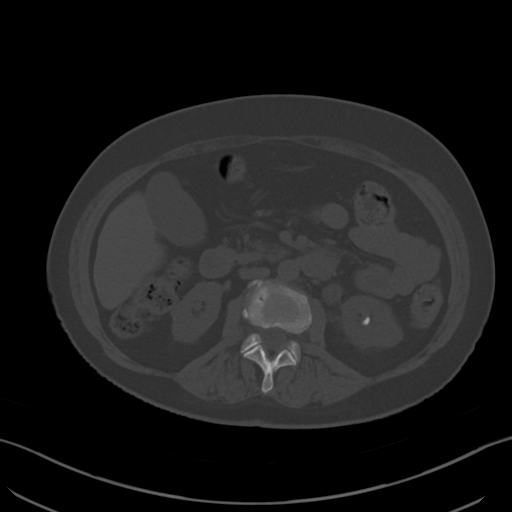
[im 64/90  soft-tissue]
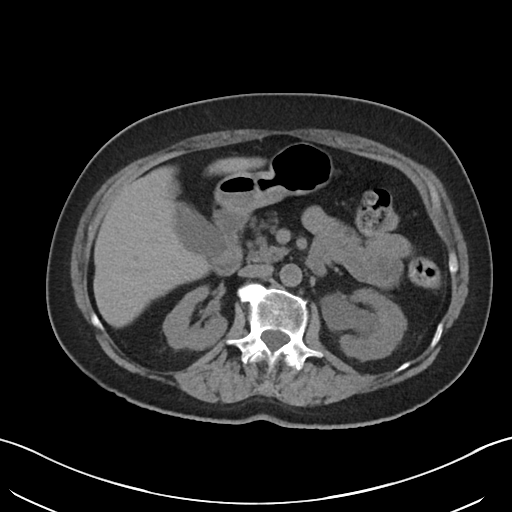
[im 71/90  soft-tissue]
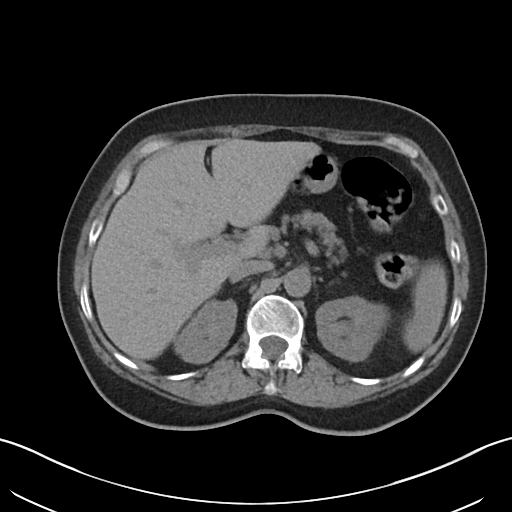
[im 78/90  soft-tissue]
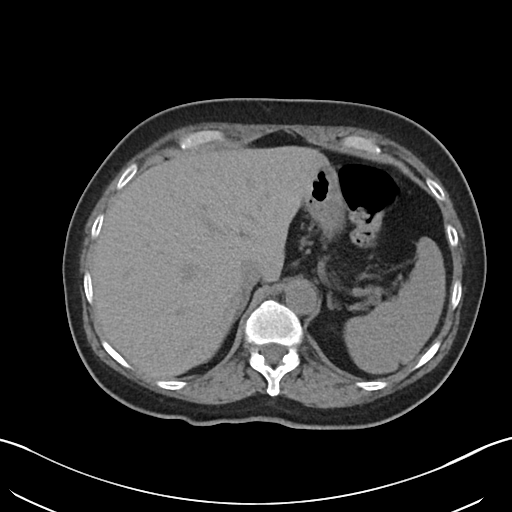
[im 86/90  soft-tissue]
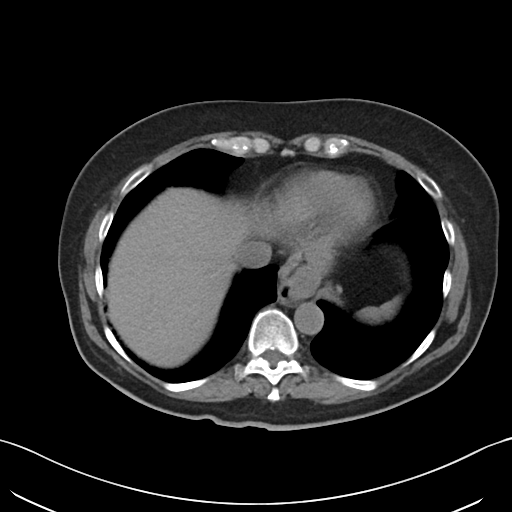

[Series 5: coronal · coronal · 0.79mm/px · 3 of 94 slices shown]
[im 32/94  soft-tissue]
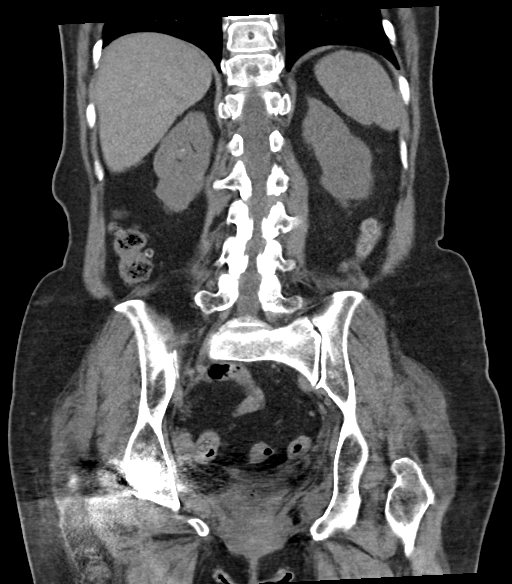
[im 42/94  soft-tissue]
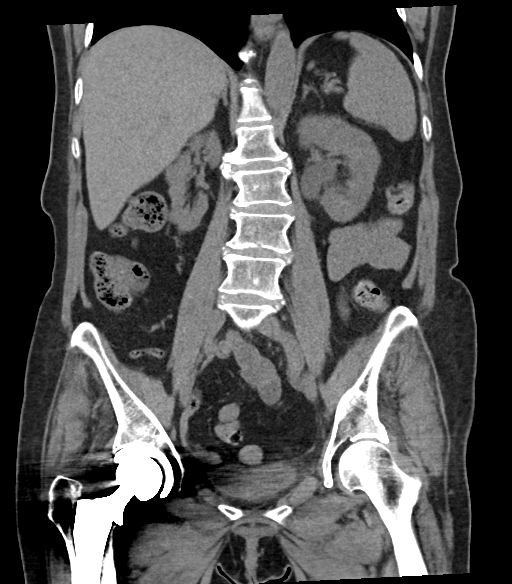
[im 52/94  soft-tissue]
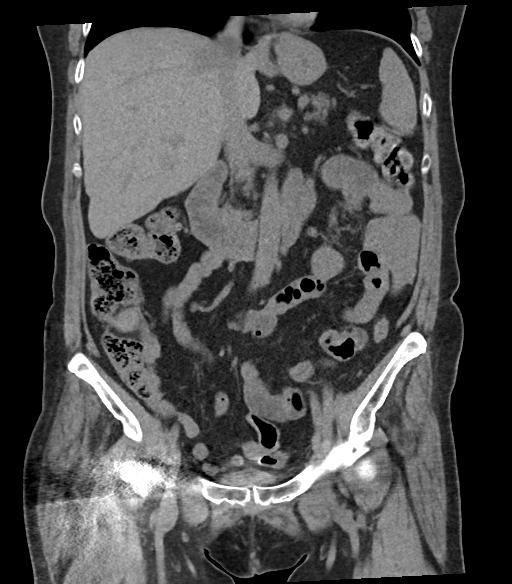

[16 of 46 positions shown; findings below may reference images not displayed]

FINDINGS: Lower chest:  No acute finding.  Nissen fundoplication

Hepatobiliary: No focal liver abnormality. Mild lobulation of the
liver surface but no definite fissure caudate lobe enlargement. No
evidence of biliary obstruction or stone. A hepatic hilar
calcification appears extra biliary.

Pancreas: Generalized fatty atrophy

Spleen: Unremarkable.

Adrenals/Urinary Tract: Negative adrenals. Left hydronephrosis and
hydroureter due to a 2 mm stone just below the iliac crossing. Left
lower pole calculus measuring 6 mm. Punctate right renal calculus.
Bilateral renal cortical lobulation attributed to scarring. Negative
bladder. Unremarkable bladder.

Stomach/Bowel: No obstruction. No visible bowel inflammation. Mild
colonic diverticulosis.

Vascular/Lymphatic: No acute vascular abnormality. No mass or
adenopathy.

Reproductive:Hysterectomy

Other: No ascites or pneumoperitoneum.

Musculoskeletal: No acute abnormalities. Total right hip
arthroplasty. Lumbar spine degeneration with scoliosis
IMPRESSION: 1. Left hydroureteronephrosis from a 2 mm distal ureteric stone.
2. Bilateral nephrolithiasis.

## 2024-04-16 ENCOUNTER — Other Ambulatory Visit: Payer: Self-pay | Admitting: Gastroenterology

## 2024-04-16 ENCOUNTER — Other Ambulatory Visit: Payer: Self-pay | Admitting: Nurse Practitioner

## 2024-04-16 DIAGNOSIS — E1165 Type 2 diabetes mellitus with hyperglycemia: Secondary | ICD-10-CM

## 2024-04-16 DIAGNOSIS — K259 Gastric ulcer, unspecified as acute or chronic, without hemorrhage or perforation: Secondary | ICD-10-CM

## 2024-04-16 DIAGNOSIS — K219 Gastro-esophageal reflux disease without esophagitis: Secondary | ICD-10-CM

## 2024-04-16 DIAGNOSIS — E782 Mixed hyperlipidemia: Secondary | ICD-10-CM

## 2024-04-23 ENCOUNTER — Ambulatory Visit: Payer: Self-pay

## 2024-04-23 ENCOUNTER — Ambulatory Visit
Admission: RE | Admit: 2024-04-23 | Discharge: 2024-04-23 | Disposition: A | Discharge status: Home / Self Care | Source: Ambulatory Visit | Attending: Medical | Admitting: Medical

## 2024-04-23 ENCOUNTER — Ambulatory Visit (INDEPENDENT_AMBULATORY_CARE_PROVIDER_SITE_OTHER): Admitting: Medical

## 2024-04-23 VITALS — BP 130/80 | HR 85 | Temp 98.3°F | Wt 141.2 lb

## 2024-04-23 DIAGNOSIS — R29898 Other symptoms and signs involving the musculoskeletal system: Secondary | ICD-10-CM | POA: Diagnosis not present

## 2024-04-23 DIAGNOSIS — M25512 Pain in left shoulder: Secondary | ICD-10-CM | POA: Diagnosis not present

## 2024-04-23 DIAGNOSIS — M19012 Primary osteoarthritis, left shoulder: Secondary | ICD-10-CM | POA: Diagnosis not present

## 2024-04-23 MED ORDER — HYDROCODONE-ACETAMINOPHEN 5-325 MG PO TABS: 1.0000 | ORAL_TABLET | Freq: Four times a day (QID) | ORAL | 0 refills | Status: AC | PRN

## 2024-04-23 NOTE — Progress Notes (Signed)
 Subjective:  Bianca Phillips is a 68 y.o. female who presents for Chief Complaint  Patient presents with   Acute Visit    Left shoulder pain, excerises a lot and not sure what she did to it. Ibuprofen is the only thing that helps     Here for left shoulder pain x 2 weeks.  Has hx/o arthritis in other joints.  No recent injury or trauma.  Does water aerobics and that helps the shoulder.   No neck pain.  No numbness or tingling.  Does have some night time pain.  Using some OTC ibuprofen, rest.  Has hx/o hip replacement, has seen ortho in past, Dr. Medford Poli.      No other aggravating or relieving factors.    No other c/o.  Past Medical History:  Diagnosis Date   Allergy    Complication of anesthesia    Constipation    GAD (generalized anxiety disorder)    GERD (gastroesophageal reflux disease)    Heart murmur    Heart palpitations    evaluated by cardiologist--- dr h. hobart, note in epic 04-03-2021, normal echo and monitor showed NSR w/ SVT   History of COVID-19    per pt had covid twice in 12/ 2020 with mild symptoms with residual intermittant loss of taste;  and 04/ 2022 with mild symptoms that resolved   History of hyperthyroidism    age 84 s/p RAI   History of kidney stones    History of recurrent UTIs    History of repair of hiatal hernia 02/2020   Hyperlipidemia    Hypothyroidism, postablative    age 40  s/p RAI for hyperthyroidism;  followed by pcp   IDA (iron deficiency anemia)    MDD (major depressive disorder)    OA (osteoarthritis)    Right ureteral calculus    Seasonal allergic rhinitis    Type 2 diabetes mellitus (HCC)    followed by pcp    (07-14-2021  per pt checks blood sugar at home 2-3 times weekly,  fasting sugar --120-130)   Urgency of urination    Wears hearing aid in both ears    Current Outpatient Medications on File Prior to Visit  Medication Sig Dispense Refill   Alpha-Lipoic Acid 600 MG TABS Take 1 tablet by mouth every evening.      ALPRAZolam  (XANAX ) 0.25 MG tablet TAKE 1 TABLET BY MOUTH 2 TIMES DAILY AS NEEDED FOR ANXIETY. 30 tablet 2   Ascorbic Acid (VITAMIN C) 1000 MG tablet Take 1,000 mg by mouth daily.     atorvastatin  (LIPITOR) 40 MG tablet TAKE 2TABS BY MOUTH X3DAYS A WEEK AND 1 TABLET 4 DAYS A WEEK 126 tablet 0   Azelastine  HCl 137 MCG/SPRAY SOLN PLACE 2 SPRAYS INTO BOTH NOSTRILS 2 (TWO) TIMES DAILY 30 mL 5   Calcium  Carbonate-Vitamin D  (CALCIUM -VITAMIN D  PO) Take 1 tablet by mouth daily.     cetirizine (ZYRTEC) 10 MG tablet Take 10 mg by mouth daily.     cholecalciferol  (VITAMIN D3) 25 MCG (1000 UNIT) tablet Take 1,000 Units by mouth daily.     levothyroxine  (SYNTHROID ) 100 MCG tablet Take 1 tablet (100 mcg total) by mouth daily. 30 tablet 4   Magnesium 250 MG TABS Take 1 tablet by mouth daily.     metFORMIN  (GLUCOPHAGE ) 1000 MG tablet TAKE 1 TABLET BY MOUTH EVERY DAY WITH BREAKFAST 90 tablet 1   Misc Natural Products (GLUCOSAMINE CHOND CMP ADVANCED PO) Take 2 capsules by mouth every  evening.     nortriptyline  (PAMELOR ) 50 MG capsule TAKE 1 CAPSULE (50 MG) BY MOUTH AT BEDTIME. 90 capsule 3   Omega-3 Fatty Acids (OMEGA-3 FISH OIL PO) Take 2,000 capsules by mouth every evening.     pantoprazole  (PROTONIX ) 40 MG tablet Take 1 tablet (40 mg total) by mouth daily. 90 tablet 3   Probiotic Product (PROBIOTIC DAILY) CAPS Take 1 capsule by mouth every evening.     sertraline  (ZOLOFT ) 50 MG tablet TAKE 1 TABLET BY MOUTH EVERY DAY 90 tablet 3   trimethoprim  (TRIMPEX ) 100 MG tablet TAKE 1 TABLET BY MOUTH EVERYDAY AT BEDTIME 90 tablet 1   Turmeric 500 MG CAPS Take 1 capsule by mouth every evening.     Blood Glucose Monitoring Suppl DEVI 1 each by Does not apply route in the morning, at noon, and at bedtime. May substitute to any manufacturer covered by patient's insurance. 1 each 0   Lancets (ONETOUCH DELICA PLUS LANCET30G) MISC      ONETOUCH ULTRA test strip  (Patient not taking: Reported on 03/29/2024)     pantoprazole   (PROTONIX ) 40 MG tablet Take 1 tablet (40 mg total) by mouth 2 (two) times daily. Two times  daily for 8 weeks then once daily. 90 tablet 3   No current facility-administered medications on file prior to visit.   Past Surgical History:  Procedure Laterality Date   CESAREAN SECTION     x2  last one 1986   CYSTOSCOPY/URETEROSCOPY/HOLMIUM LASER/STENT PLACEMENT  2012   approx   CYSTOSCOPY/URETEROSCOPY/HOLMIUM LASER/STENT PLACEMENT Right 07/20/2021   Procedure: CYSTOSCOPY/RETROGRADE/URETEROSCOPY/HOLMIUM LASER/STENT PLACEMENT;  Surgeon: Selma Donnice SAUNDERS, MD;  Location: [facility];  Service: Urology;  Laterality: Right;  ONLY NEEDS 45 MIN   LAPAROSCOPIC NISSEN FUNDOPLICATION  02/2020   TOTAL ABDOMINAL HYSTERECTOMY W/ BILATERAL SALPINGOOPHORECTOMY  2002   TOTAL HIP ARTHROPLASTY Right 08/30/2021   Procedure: TOTAL HIP ARTHROPLASTY ANTERIOR APPROACH;  Surgeon: Vernetta Lonni GRADE, MD;  Location: [facility] OR;  Service: Orthopedics;  Laterality: Right;   UPPER GASTROINTESTINAL ENDOSCOPY     The following portions of the patient's history were reviewed and updated as appropriate: allergies, current medications, past family history, past medical history, past social history, past surgical history and problem list.  ROS Otherwise as in subjective above     Objective: BP 130/80   Pulse 85   Temp 98.3 F (36.8 C)   Wt 141 lb 3.2 oz (64 kg)   BMI 23.50 kg/m   General appearance: alert, no distress, well developed, well nourished MSK: tender over left shoulder AC joint and bicep origin, otherwise nontender to palpation, pain with crossover, apprehension and ROM in general, but milder pain with general ROM.    Weakness with rotator cuff tests.  No obvious laxity, no swelling.     Otherwise arms neurovascularly intact    Assessment: Encounter Diagnoses  Name Primary?   Left shoulder pain, unspecified chronicity Yes   Weakness of left shoulder      Plan: Discussed findings  and possible causes.  You have some rotator cuff weakness, some decreased range of motion, pain at the [facility] joint and bicep origin of the shoulder.   Recommendations: Go for xray You can use over the counter Tylenol  325mg  or 500mg  once or twice daily for pain for now You can use some over the counter Ibuprofen 200mg , 2 or 3 tablets twice daily for a week for pain and inflammation For worse or breakthrough pain, you can use Norco/Hydrocodone  pain medication  for worse pain.   Ibuprofen should be short term give your kidney function Use an arm sling this week on and off to rest the arm Continue your glucosamine chondroitin daily Continue water aerobics but be less aggressive with the left arm during your aerobic sessions for the next 1-2 weeks Pending xray, we may need to have you see physical therapy or orthopedics    Josselin was seen today for acute visit.  Diagnoses and all orders for this visit:  Left shoulder pain, unspecified chronicity -     DG Shoulder Left; Future  Weakness of left shoulder -     DG Shoulder Left; Future  Other orders -     HYDROcodone -acetaminophen  (NORCO/VICODIN) 5-325 MG tablet; Take 1 tablet by mouth every 6 (six) hours as needed for moderate pain (pain score 4-6).   Follow up: pending xray

## 2024-04-23 NOTE — Telephone Encounter (Signed)
 FYI Only or Action Required?: FYI only for provider.  Patient was last seen in primary care on 03/29/2024 by Joyce Norleen BROCKS, MD.  Called Nurse Triage reporting Shoulder Pain.  Symptoms began several weeks ago.  Interventions attempted: Nothing.  Symptoms are: gradually worsening.  Triage Disposition: See Physician Within 24 Hours  Patient/caregiver understands and will follow disposition?: yes  Denies SOB, denies worsening pain with exertion, denies jaw pain.    Copied from CRM [id number]. Topic: Clinical - Red Word Triage >> Apr 23, 2024 12:37 PM Donee H wrote: Red Word that prompted transfer to Nurse Triage: Patient stating experiencing a lot of pain in left shoulder. She states this has been going on for about 2 weeks. It hurts when moving it but eases some when in water. She wanted to set up appointment with Camie Doing but earliest appointment isn't until Sept. She states really would like to get shoulder check out. Reason for Disposition  [1] Unable to use arm at all AND [2] because of shoulder pain or stiffness  Answer Assessment - Initial Assessment Questions 1. ONSET: When did the pain start?     2 wks ago 2. LOCATION: Where is the pain located?     L shoulder 3. PAIN: How bad is the pain? (Scale 1-10; or mild, moderate, severe)     With movement 5/6 4. WORK OR EXERCISE: Has there been any recent work or exercise that involved this part of the body?     denies 5. CAUSE: What do you think is causing the shoulder pain?     unsure 6. OTHER SYMPTOMS: Do you have any other symptoms? (e.g., neck pain, swelling, rash, fever, numbness, weakness)     denies  Protocols used: Shoulder Pain-A-AH

## 2024-04-23 NOTE — Patient Instructions (Signed)
 Please go to [facility] for your left shoulder xray.   Their hours are 8am - 4:30 pm Monday - Friday.  Take your insurance card with you.  [facility] [phone]  [address] [place], [place] [address]

## 2024-04-24 ENCOUNTER — Other Ambulatory Visit: Payer: Self-pay | Admitting: Medical

## 2024-04-24 ENCOUNTER — Ambulatory Visit: Payer: Self-pay | Admitting: Medical

## 2024-04-24 DIAGNOSIS — G8929 Other chronic pain: Secondary | ICD-10-CM

## 2024-04-24 DIAGNOSIS — M19012 Primary osteoarthritis, left shoulder: Secondary | ICD-10-CM

## 2024-04-24 DIAGNOSIS — M67912 Unspecified disorder of synovium and tendon, left shoulder: Secondary | ICD-10-CM

## 2024-04-24 NOTE — Progress Notes (Signed)
 Your x-ray shows some degenerative age-related changes in the socket of the shoulder as well as AC joint on top of the shoulder.  Use the recommendation we discussed yesterday including arm sling for a few days, Aleve or ibuprofen for the next few days, cold therapy, relative rest.  I do recommend referral to orthopedics for further evaluation given some of the rotator cuff weakness and given these findings on x-ray.  I will place a referral

## 2024-05-07 ENCOUNTER — Telehealth: Payer: Self-pay

## 2024-05-07 NOTE — Telephone Encounter (Signed)
 I do not see where we increased her Metformin  just Atorvastatin . It also looks like she is seeing Endocrinology now.   Copied from CRM #[id number]. Topic: Clinical - Prescription Issue >> May 07, 2024 11:59 AM Donna BRAVO wrote: Reason for CRM: [facility] with [facility], [facility] and  informed Macario she was taking 2 pills of metFORMIN  (GLUCOPHAGE ) 1000 MG tablet a day.   Macario needs clarification and a new prescription if patient will be taking 2 day.

## 2024-05-22 ENCOUNTER — Ambulatory Visit (INDEPENDENT_AMBULATORY_CARE_PROVIDER_SITE_OTHER): Admitting: Nurse Practitioner

## 2024-05-22 ENCOUNTER — Encounter: Payer: Self-pay | Admitting: Nurse Practitioner

## 2024-05-22 VITALS — BP 142/78 | HR 98 | Wt 139.6 lb

## 2024-05-22 DIAGNOSIS — R030 Elevated blood-pressure reading, without diagnosis of hypertension: Secondary | ICD-10-CM

## 2024-05-22 DIAGNOSIS — F411 Generalized anxiety disorder: Secondary | ICD-10-CM | POA: Diagnosis not present

## 2024-05-22 DIAGNOSIS — E785 Hyperlipidemia, unspecified: Secondary | ICD-10-CM | POA: Diagnosis not present

## 2024-05-22 DIAGNOSIS — E1165 Type 2 diabetes mellitus with hyperglycemia: Secondary | ICD-10-CM | POA: Diagnosis not present

## 2024-05-22 DIAGNOSIS — E1169 Type 2 diabetes mellitus with other specified complication: Secondary | ICD-10-CM

## 2024-05-22 DIAGNOSIS — Z1231 Encounter for screening mammogram for malignant neoplasm of breast: Secondary | ICD-10-CM

## 2024-05-22 DIAGNOSIS — E890 Postprocedural hypothyroidism: Secondary | ICD-10-CM | POA: Diagnosis not present

## 2024-05-22 DIAGNOSIS — E782 Mixed hyperlipidemia: Secondary | ICD-10-CM | POA: Diagnosis not present

## 2024-05-22 DIAGNOSIS — G43019 Migraine without aura, intractable, without status migrainosus: Secondary | ICD-10-CM | POA: Diagnosis not present

## 2024-05-22 DIAGNOSIS — N1832 Chronic kidney disease, stage 3b: Secondary | ICD-10-CM

## 2024-05-22 DIAGNOSIS — M25512 Pain in left shoulder: Secondary | ICD-10-CM | POA: Diagnosis not present

## 2024-05-22 MED ORDER — ATORVASTATIN CALCIUM 40 MG PO TABS: ORAL_TABLET | ORAL | 3 refills | Status: DC

## 2024-05-22 MED ORDER — METFORMIN HCL 1000 MG PO TABS: 1000.0000 mg | ORAL_TABLET | Freq: Two times a day (BID) | ORAL | 3 refills | Status: AC

## 2024-05-22 NOTE — Progress Notes (Signed)
 Catheline Doing, DNP, AGNP-c [facility]  [address] [place], [place] [address] [phone]  ESTABLISHED PATIENT- Chronic Health and/or Follow-Up Visit on 05/22/2024  Blood pressure (!) 142/78, pulse 98, weight 139 lb 9.6 oz (63.3 kg).  142/78 second check HPI: Seeing endocrine for management of diabetes and thyroid  disease Seeing urology for kidney function Seeing GI for gerd  History of Present Illness Bianca Phillips is a 68 year old female with diabetes and hypothyroidism who presents for medication management and shoulder pain.  She has been experiencing left shoulder pain for two weeks, which radiates down her arm. 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 She has undergone an x-ray and is scheduled to see her endocrinologist, Dr. Vernetta, tomorrow.  Her diabetes is managed with metformin , initially at 1000 mg once daily, now increased to 1000 mg twice daily, resulting in improved blood sugar levels from 7.2% to 6.5%. There was a prescription record discrepancy at her pharmacy regarding the updated dosage. She maintains regular exercise and dietary habits, including chocolate-covered blueberries and occasional desserts.  Her thyroid  medication, levothyroxine , was recently reduced from 150 mcg to 100 mcg due to fluctuating thyroid  levels. She has been on thyroid  medication for nearly 50 years and is awaiting further evaluation.  She takes atorvastatin  for cholesterol management, with a regimen of two tablets three times a week and one tablet on other days. She has a history of high triglycerides and is aware of dietary influences on her lipid levels.  She experiences occasional knee pain, previously diagnosed as a Baker's cyst, but it is not consistently painful. She remains active, engaging in swimming and dancing.  She takes sertraline  and nortriptyline  for mood and migraine prevention, respectively. She experiences  auras a couple of times a month, which she manages with Excedrin to prevent pain.  She experiences occasional dizziness upon standing quickly and a recent sore on her tongue, possibly from biting it. She monitors her blood pressure at home, with a recent reading of 142/78 mmHg, and notes her heart rate often exceeds 100 bpm.  She wants to talk about metformin    All ROS negative with exception of what is listed above.   PHYSICAL EXAM Physical Exam Vitals and nursing note reviewed.  Constitutional:      Appearance: Normal appearance.  HENT:     Head: Normocephalic.  Eyes:     Pupils: Pupils are equal, round, and reactive to light.  Cardiovascular:     Rate and Rhythm: Normal rate and regular rhythm.     Pulses: Normal pulses.     Heart sounds: Normal heart sounds.  Pulmonary:     Effort: Pulmonary effort is normal.     Breath sounds: Normal breath sounds.  Musculoskeletal:        General: Tenderness present.     Cervical back: Normal range of motion.     Right lower leg: No edema.     Left lower leg: No edema.     Comments: Limited ROM in left shoulder. Abduction no further than 90 degrees noted.   Skin:    General: Skin is warm.     Capillary Refill: Capillary refill takes less than 2 seconds.  Neurological:     General: No focal deficit present.     Mental Status: She is alert and oriented to person, place, and time.  Psychiatric:        Mood and Affect: Mood normal.  PLAN Problem List Items Addressed This Visit     Type 2 diabetes mellitus with hyperglycemia, without long-term current use of insulin  (HCC) - Primary   Type 2 diabetes mellitus is well-controlled with metformin . Blood sugar levels have improved from 7.2 to 6.5. She is taking metformin  1000 mg twice daily, which was not updated in the pharmacy records. - Update metformin  prescription to 1000 mg twice daily - Encourage moderation in sugar intake - Continue regular exercise      Relevant Medications    metFORMIN  (GLUCOPHAGE ) 1000 MG tablet   atorvastatin  (LIPITOR) 40 MG tablet   Postoperative hypothyroidism   Hypothyroidism management is complicated by inconsistent thyroid  levels. Current dose of levothyroxine  has been reduced from 150 mcg to 100 mcg. Endocrinologist considering genetic testing due to possible non-response to levothyroxine . Discussion about alternative thyroid  medications such as Cytomel and NP thyroid , which have different mechanisms and stability compared to levothyroxine . - Continue current levothyroxine  dose - Discuss genetic testing with endocrinologist - Follow up with endocrinologist next month       Hyperlipidemia associated with type 2 diabetes mellitus (HCC)   Hyperlipidemia is managed with atorvastatin . Current regimen is two tablets three times a week and one tablet on other days. Triglycerides were noted to be high previously. - Continue current atorvastatin  regimen       Relevant Medications   metFORMIN  (GLUCOPHAGE ) 1000 MG tablet   atorvastatin  (LIPITOR) 40 MG tablet   Chronic kidney disease (CKD) stage G3b/A2, moderately decreased glomerular filtration rate (GFR) between 30-44 mL/min/1.73 square meter and albuminuria creatinine ratio between 30-299 mg/g (HCC)   Chronic kidney disease stage 3b is well-managed. Emphasis on hydration and blood pressure control. - Maintain hydration - Monitor blood pressure regularly      Elevated blood pressure reading in office without diagnosis of hypertension   Hypertension is monitored. Recent blood pressure reading was 142/78. She is advised to monitor blood pressure at home. - Monitor blood pressure at home three times a week for two weeks - Report blood pressure readings       Generalized anxiety disorder   Anxiety and Depression is managed with sertraline . She reports good mood and active social life. 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 Frequency of migraines has decreased significantly since starting medication. Occasional auras occur, but pain is managed with Excedrin. - Continue nortriptyline  at bedtime - Use Excedrin at onset of aura      Relevant Medications   atorvastatin  (LIPITOR) 40 MG tablet   Left shoulder pain   Left shoulder pain attributed to osteoarthritis. She has an appointment with Dr. Vernetta for further evaluation. - Follow up with Dr. Vernetta for shoulder evaluation      Other Visit Diagnoses       Screening mammogram for breast cancer       Relevant Orders   MM 3D SCREENING MAMMOGRAM BILATERAL BREAST     Mixed hyperlipidemia       Relevant Medications   metFORMIN  (GLUCOPHAGE ) 1000 MG tablet   atorvastatin  (LIPITOR) 40 MG tablet       Oral ulcer Oral ulcer on tongue, likely due to trauma such as biting. Present for 3-4 days. - Rinse mouth with warm salt water  Return in about 6 months (around 11/22/2024) for CPE.  SaraBeth Samir Ishaq, DNP, AGNP-c Time: 38 minutes, >50% spent counseling, care coordination, chart review,  and documentation.  SABRAtime

## 2024-05-22 NOTE — Patient Instructions (Addendum)
 Keep an eye on your blood pressure 3-4 times a week at a time when you are calm and relaxed and write these down for me and send them to me in 2 weeks.   I have sent the order for the mammogram for you. They will call you to schedule.

## 2024-05-23 ENCOUNTER — Other Ambulatory Visit: Payer: Self-pay | Admitting: Nurse Practitioner

## 2024-05-23 ENCOUNTER — Ambulatory Visit (INDEPENDENT_AMBULATORY_CARE_PROVIDER_SITE_OTHER): Admitting: Orthopaedic Surgery

## 2024-05-23 DIAGNOSIS — M25512 Pain in left shoulder: Secondary | ICD-10-CM | POA: Diagnosis not present

## 2024-05-23 DIAGNOSIS — M7542 Impingement syndrome of left shoulder: Secondary | ICD-10-CM

## 2024-05-23 MED ORDER — LIDOCAINE HCL 1 % IJ SOLN
3.0000 mL | INTRAMUSCULAR | Status: AC | PRN
  Administered 2024-05-23: 3 mL

## 2024-05-23 MED ORDER — ONETOUCH DELICA PLUS LANCET30G MISC: 1.0000 | Freq: Every day | 2 refills | Status: AC

## 2024-05-23 MED ORDER — METHYLPREDNISOLONE ACETATE 40 MG/ML IJ SUSP
40.0000 mg | INTRAMUSCULAR | Status: AC | PRN
  Administered 2024-05-23: 40 mg via INTRA_ARTICULAR

## 2024-05-23 NOTE — Telephone Encounter (Signed)
 Copied from CRM #[id number]. Topic: Clinical - Medication Refill >> May 23, 2024  1:08 PM Antwanette L wrote: Medication: Lancets (ONETOUCH DELICA PLUS LANCET30G) MISC and ONETOUCH ULTRA test strip  Has the patient contacted their pharmacy? Yes  This is the patient's preferred pharmacy:  [id number] [place], [place] - [address] [place] [address] Phone: [phone] Fax: [phone]  Is this the correct pharmacy for this prescription? Yes   Has the prescription been filled recently? No. Test strip was refilled on 12/29/22 and lancets was refilled on 12/28/22  Is the patient out of the medication? Yes  Has the patient been seen for an appointment in the last year OR does the patient have an upcoming appointment? Yes. Last ov w/ Camie Early was on 05/19/24  Can we respond through MyChart? Yes  Agent: Please be advised that Rx refills may take up to 3 business days. We ask that you follow-up with your pharmacy.

## 2024-05-23 NOTE — Progress Notes (Signed)
 The patient comes in today with a chief complaint of left shoulder pain has been hurting for several weeks now and it does wake her up at night.  She reports decreased strength and range of motion of that shoulder.  She is an active 68 year old female.  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  She denies any specific injury.  She is diabetic but under good control.  Examination of her left shoulder does show evidence of impingement.  She does have decreased forward flexion as well as internal rotation with adduction.  She does use slightly her deltoids when abducting her left shoulder.  X-rays in the canopy system already of her left shoulder show mild to moderate glenohumeral arthritis and AC joint arthritis.  The humeral head is not high riding.  I talked to her about outpatient physical therapy as well as a steroid injection of the left shoulder subacromial outlet.  She has been taking ibuprofen intermittently.  She agreed to this treatment plan.  She did tolerate steroid injection well.  Will see her back in 4 weeks after course of physical therapy on her left shoulder with any modalities per the therapist discretion.    Procedure Note  Patient: Bianca Phillips             Date of Birth: Apr 08, 1956           MRN: [id number]             Visit Date: 05/23/2024  Procedures: Visit Diagnoses: No diagnosis found.  Large Joint Inj: L subacromial bursa on 05/23/2024 9:38 AM Indications: pain and diagnostic evaluation Details: 22 G 1.5 in needle  Arthrogram: No  Medications: 3 mL lidocaine  1 %; 40 mg methylPREDNISolone  acetate 40 MG/ML Outcome: tolerated well, no immediate complications Procedure, treatment alternatives, risks and benefits explained, specific risks discussed. Consent was given by the patient. Immediately prior to procedure a time out was called to verify the correct patient, procedure, equipment, support  staff and site/side marked as required. Patient was prepped and draped in the usual sterile fashion.

## 2024-05-24 ENCOUNTER — Other Ambulatory Visit: Payer: Self-pay

## 2024-05-24 DIAGNOSIS — M25512 Pain in left shoulder: Secondary | ICD-10-CM

## 2024-05-24 DIAGNOSIS — M7542 Impingement syndrome of left shoulder: Secondary | ICD-10-CM

## 2024-05-30 ENCOUNTER — Encounter: Payer: Self-pay | Admitting: Nurse Practitioner

## 2024-05-30 DIAGNOSIS — M25512 Pain in left shoulder: Secondary | ICD-10-CM | POA: Insufficient documentation

## 2024-05-30 DIAGNOSIS — R030 Elevated blood-pressure reading, without diagnosis of hypertension: Secondary | ICD-10-CM | POA: Insufficient documentation

## 2024-05-30 NOTE — Assessment & Plan Note (Signed)
 Anxiety and Depression is managed with sertraline . She reports good mood and active social life. - Continue sertraline 

## 2024-05-30 NOTE — Assessment & Plan Note (Signed)
 Left shoulder pain attributed to osteoarthritis. She has an appointment with Dr. Vernetta for further evaluation. - Follow up with Dr. Vernetta for shoulder evaluation

## 2024-05-30 NOTE — Assessment & Plan Note (Signed)
 Hypothyroidism management is complicated by inconsistent thyroid  levels. Current dose of levothyroxine  has been reduced from 150 mcg to 100 mcg. Endocrinologist considering genetic testing due to possible non-response to levothyroxine . Discussion about alternative thyroid  medications such as Cytomel and NP thyroid , which have different mechanisms and stability compared to levothyroxine . - Continue current levothyroxine  dose - Discuss genetic testing with endocrinologist - Follow up with endocrinologist next month

## 2024-05-30 NOTE — Assessment & Plan Note (Signed)
 Migraine with aura is managed with nortriptyline  at bedtime. Frequency of migraines has decreased significantly since starting medication. Occasional auras occur, but pain is managed with Excedrin. - Continue nortriptyline  at bedtime - Use Excedrin at onset of aura

## 2024-05-30 NOTE — Assessment & Plan Note (Signed)
 Hyperlipidemia is managed with atorvastatin . Current regimen is two tablets three times a week and one tablet on other days. Triglycerides were noted to be high previously. - Continue current atorvastatin  regimen

## 2024-05-30 NOTE — Assessment & Plan Note (Signed)
 Hypertension is monitored. Recent blood pressure reading was 142/78. She is advised to monitor blood pressure at home. - Monitor blood pressure at home three times a week for two weeks - Report blood pressure readings

## 2024-05-30 NOTE — Assessment & Plan Note (Signed)
 Type 2 diabetes mellitus is well-controlled with metformin . Blood sugar levels have improved from 7.2 to 6.5. She is taking metformin  1000 mg twice daily, which was not updated in the pharmacy records. - Update metformin  prescription to 1000 mg twice daily - Encourage moderation in sugar intake - Continue regular exercise

## 2024-05-30 NOTE — Assessment & Plan Note (Signed)
 Chronic kidney disease stage 3b is well-managed. Emphasis on hydration and blood pressure control. - Maintain hydration - Monitor blood pressure regularly

## 2024-05-31 ENCOUNTER — Other Ambulatory Visit: Payer: Self-pay

## 2024-05-31 ENCOUNTER — Ambulatory Visit (HOSPITAL_BASED_OUTPATIENT_CLINIC_OR_DEPARTMENT_OTHER): Attending: Orthopaedic Surgery | Admitting: Physical Therapy

## 2024-05-31 ENCOUNTER — Encounter (HOSPITAL_BASED_OUTPATIENT_CLINIC_OR_DEPARTMENT_OTHER): Payer: Self-pay | Admitting: Physical Therapy

## 2024-05-31 DIAGNOSIS — M25612 Stiffness of left shoulder, not elsewhere classified: Secondary | ICD-10-CM | POA: Diagnosis not present

## 2024-05-31 DIAGNOSIS — M25512 Pain in left shoulder: Secondary | ICD-10-CM | POA: Diagnosis not present

## 2024-05-31 DIAGNOSIS — M7542 Impingement syndrome of left shoulder: Secondary | ICD-10-CM | POA: Insufficient documentation

## 2024-05-31 DIAGNOSIS — M6281 Muscle weakness (generalized): Secondary | ICD-10-CM | POA: Insufficient documentation

## 2024-05-31 NOTE — Therapy (Signed)
 OUTPATIENT PHYSICAL THERAPY SHOULDER EVALUATION   Patient Name: Bianca Phillips MRN: [id number] DOB:12/21/1955, 68 y.o., female Today's Date: 06/01/2024  END OF SESSION:  PT End of Session - 06/01/24 1529     Visit Number 1    Number of Visits 12    Date for PT Re-Evaluation 07/12/24    Authorization Type HTA    PT Start Time 1154    PT Stop Time 1242    PT Time Calculation (min) 48 min    Activity Tolerance Patient tolerated treatment well    Behavior During Therapy [facility] for tasks assessed/performed          Past Medical History:  Diagnosis Date   Acute non-recurrent frontal sinusitis 07/14/2022   Adhesive capsulitis of right shoulder 02/05/2021   Allergy    Complication of anesthesia    Constipation    Esophagitis, [place] grade C 06/01/2023   GAD (generalized anxiety disorder)    GERD (gastroesophageal reflux disease)    Heart murmur    Heart palpitations    evaluated by cardiologist--- dr h. hobart, note in epic 04-03-2021, normal echo and monitor showed NSR w/ SVT   History of COVID-19    per pt had covid twice in 12/ 2020 with mild symptoms with residual intermittant loss of taste;  and 04/ 2022 with mild symptoms that resolved   History of hyperthyroidism    age 24 s/p RAI   History of kidney stones    History of recurrent UTIs    History of repair of hiatal hernia 02/2020   Hyperlipidemia    Hypothyroidism, postablative    age 28  s/p RAI for hyperthyroidism;  followed by pcp   IDA (iron deficiency anemia)    Itching in the vaginal area 02/05/2021   Kidney stone 12/16/2014   MDD (major depressive disorder)    OA (osteoarthritis)    Pain in left shin 01/19/2023   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   Right ureteral calculus    Seasonal allergic rhinitis    Type 2 diabetes mellitus (HCC)    followed by pcp    (07-14-2021  per pt checks blood sugar at home 2-3 times weekly,  fasting sugar --120-130)   Urgency of urination    Wears hearing aid in  both ears    Past Surgical History:  Procedure Laterality Date   CESAREAN SECTION     x2  last one 1986   CYSTOSCOPY/URETEROSCOPY/HOLMIUM LASER/STENT PLACEMENT  2012   approx   CYSTOSCOPY/URETEROSCOPY/HOLMIUM LASER/STENT PLACEMENT Right 07/20/2021   Procedure: CYSTOSCOPY/RETROGRADE/URETEROSCOPY/HOLMIUM LASER/STENT PLACEMENT;  Surgeon: Selma Donnice SAUNDERS, MD;  Location: [facility];  Service: Urology;  Laterality: Right;  ONLY NEEDS 45 MIN   LAPAROSCOPIC NISSEN FUNDOPLICATION  02/2020   TOTAL ABDOMINAL HYSTERECTOMY W/ BILATERAL SALPINGOOPHORECTOMY  2002   TOTAL HIP ARTHROPLASTY Right 08/30/2021   Procedure: TOTAL HIP ARTHROPLASTY ANTERIOR APPROACH;  Surgeon: Vernetta Lonni GRADE, MD;  Location: [facility] OR;  Service: Orthopedics;  Laterality: Right;   UPPER GASTROINTESTINAL ENDOSCOPY     Patient Active Problem List   Diagnosis Date Noted   Elevated blood pressure reading in office without diagnosis of hypertension 05/30/2024   Left shoulder pain 05/30/2024   Trigger finger, right middle finger 06/24/2023   Gastric ulcer without hemorrhage or perforation 06/01/2023   Chronic constipation 01/19/2023   Closed right hip fracture (HCC) 08/29/2021   Insomnia 06/24/2021   Candida lusitanea infection 03/16/2021   Chronic kidney disease (CKD) stage G3b/A2, moderately decreased glomerular filtration  rate (GFR) between 30-44 mL/min/1.73 square meter and albuminuria creatinine ratio between 30-299 mg/g (HCC) 03/16/2021   Acute midline low back pain without sciatica 02/24/2021   Tachycardia 02/05/2021   Generalized anxiety disorder 02/05/2021   Heart palpitations 02/05/2021   Urinary frequency 02/05/2021   Type 2 diabetes mellitus with hyperglycemia, without long-term current use of insulin  (HCC) 02/05/2021   Postoperative hypothyroidism 02/05/2021   Hyperlipidemia associated with type 2 diabetes mellitus (HCC) 02/05/2021   Intractable migraine without aura and without status migrainosus  02/05/2021   Seasonal allergic rhinitis due to pollen 02/05/2021   Gastroesophageal reflux disease 02/27/2020   Lump of right breast 11/28/2019    PCP: Oris Camie BRAVO, NP  REFERRING PROVIDER: Vernetta Lonni GRADE, MD   REFERRING DIAG: [phone] (ICD-10-CM) - Acute pain of left shoulder M75.42 (ICD-10-CM) - Impingement syndrome of left shoulder  THERAPY DIAG:  Left shoulder pain, unspecified chronicity  Muscle weakness (generalized)  Stiffness of left shoulder, not elsewhere classified  Rationale for Evaluation and Treatment: Rehabilitation  ONSET DATE: July 2025  SUBJECTIVE:                                                                                                                                                                                      SUBJECTIVE STATEMENT: Pt reports her pain began 1 month ago with no specific MOI.  Pt woke up with pain.  Pt took anti-inflammatories which helped a little.  MD informed her she could use a sling if it helped.  She used the sling for a little bit which did help.  Pt had x rays and was told she had arthritis.  Pt had an injection in shoulder on 8/20 which took a few days for pain relief.  Pt alternates ice and heat which helps.   MD note indicated Examination of her left shoulder does show evidence of impingement.   Pt was seen in PT for R shoulder adhesive capsulitis last year.  Pt did very well in PT and states her shoulder is feels good.    Pt swims 3x/wk at the [facility] [place]  Pt has minimal pain with swimming.    Pt has pain with reaching arm overhead and donning shirt.  Pt is limited and painful with reaching behind back.  Pt has a little pain with hair care.  Pt carries less weight with shopping bags.  Pt took Ibuprofen before coming to PT.        Hand dominance: Right  PERTINENT HISTORY: R shoulder adhesive capsulitis in 2024 DM type 2 R THA in 2022 depression Anemia  PAIN:  NPRS:  0/10 at rest, 1-2/10  with movement, 5/10 worst. Worst pain with unexpected movements Location:  L shoulder and proximal UE  PRECAUTIONS: Other: R THA   WEIGHT BEARING RESTRICTIONS: No  FALLS:  Has patient fallen in last 6 months? No  LIVING ENVIRONMENT: Lives with: lives alone   OCCUPATION: Retired  PLOF: Independent  PATIENT GOALS:to perform her normal act's including swimming without pain   OBJECTIVE:  Note: Objective measures were completed at Evaluation unless otherwise noted.  DIAGNOSTIC FINDINGS:  X rays on 04/23/24 FINDINGS: There is no evidence of fracture or dislocation. Glenohumeral degenerative change with spurring from the humeral head and mild joint space narrowing. There is minimal acromioclavicular degenerative spurring. No evidence of erosion or focal bone abnormality. Soft tissues are unremarkable.   IMPRESSION: Mild glenohumeral and acromioclavicular degenerative change.  PATIENT SURVEYS:  UEFI:  68/80  COGNITION: Overall cognitive status: Within functional limits for tasks assessed      UPPER EXTREMITY ROM:   AROM Right Eval  Left eval  Shoulder flexion 136 138 with pain PROM:  160  Shoulder extension 134 146  Shoulder abduction 96 135  Shoulder adduction    Shoulder internal rotation  65  Shoulder external rotation  44/49 AROM/PROM  Elbow flexion    Elbow extension    Wrist flexion    Wrist extension    Wrist ulnar deviation    Wrist radial deviation    Wrist pronation    Wrist supination    (Blank rows = not tested)  UPPER EXTREMITY MMT:  MMT Right eval Left eval  Shoulder flexion    Shoulder extension    Shoulder abduction    Shoulder adduction    Shoulder internal rotation  4-/5  Shoulder external rotation  4/5  Middle trapezius    Lower trapezius    Elbow flexion    Elbow extension    Wrist flexion    Wrist extension    Wrist ulnar deviation    Wrist radial deviation    Wrist pronation    Wrist supination    Grip strength  (lbs)    (Blank rows = not tested)  SHOULDER SPECIAL TESTS: Impingement tests: Neer's and Hawkin's [place]:  negative on R, positive on L   PALPATION:  No tenderness with palpation t/o L shoulder                                                                                                                             TREATMENT:  Pt performed supine serratus punches 2x10, supine shoulder ABC x 1 rep, and prone extension 2x10.  Pt received a HEP handout and was educated in correct form and appropriate frequency.  PT instructed she should not have pain with HEP.   PATIENT EDUCATION: Education details: dx, relevant anatomy, objective findings, POC, rationale of interventions, and HEP.  Person educated: Patient Education method: Explanation, Demonstration, Tactile cues, Verbal cues, and Handouts Education comprehension: verbalized understanding, returned demonstration, verbal cues required, tactile cues required,  and needs further education  HOME EXERCISE PROGRAM: Access Code: [email or web address] URL: [email or web address] Date: 06/01/2024 Prepared by: Mose Minerva  Exercises - Supine Single Arm Shoulder Protraction  - 1 x daily - 7 x weekly - 2 sets - 10 reps - Supine Shoulder Alphabet  - 1 x daily - 7 x weekly - 1 reps - Prone Shoulder Extension - Single Arm  - 1 x daily - 6-7 x weekly - 2 sets - 10 reps  ASSESSMENT:  CLINICAL IMPRESSION: Patient is a 68 y.o. female with a dx of L shoulder pain with MD note indicating evidence of impingement.  Pt received PT for R shoulder adhesive capsulitis last year and responded well to PT.  Pt has pain with ADL's including dressing and hair care.  She has pain with reaching arm overhead and is limited with reaching behind back.  She carries less weight with shopping bags. Pt enjoys swimming and consistently swims weekly.  She reports minimal pain with swimming.  Pt has limited L shoulder ROM and weakness in RTC.  Pt should benefit from  skilled PT to address impairments and overall function.   OBJECTIVE IMPAIRMENTS: decreased ROM, decreased strength, hypomobility, impaired flexibility, impaired UE functional use, and pain.   ACTIVITY LIMITATIONS: carrying, lifting, dressing, reach over head, and hygiene/grooming  PARTICIPATION LIMITATIONS: shopping and swimming  PERSONAL FACTORS: 1-2 comorbidities: DM type 2, R shoulder adhesive capsulitis are also affecting patient's functional outcome.   REHAB POTENTIAL: Good  CLINICAL DECISION MAKING: Stable/uncomplicated  EVALUATION COMPLEXITY: Low   GOALS:   SHORT TERM GOALS: Target date: 06/21/2024   Pt will be independent and compliant with HEP for improved pain, ROM, strength, and function.   Baseline: Goal status: INITIAL  2.  Pt will demo at least a 10 deg increase in L shoulder flexion AROM and demo at least 50 deg in ER AROM for improved shoulder mobility and stiffness.  Baseline:  Goal status: INITIAL  3.  Pt will report worst pain to be no > 3/10 for improved pain with unexpected movements. Baseline:  Goal status: INITIAL  4.  Pt will be able to perform hair care without difficulty and without increased pain.  Baseline:  Goal status: INITIAL Target date:  06/28/24   LONG TERM GOALS: Target date: 07/11/2024  Pt will report no pain with swimming. Baseline:  Goal status: INITIAL  2.  Pt will be able to don/doff shirt without increased pain and difficulty.   Baseline:  Goal status: INITIAL  3.  Pt will report she is able to perform her normal overhead activities without significant pain.  Baseline:  Goal status: INITIAL  4.  Pt will demo improved Lshoulder strength to 5/5 MMT in flexion, scaption, ER, and IR for improved tolerance with functional carrying and lifting.  Baseline:  Goal status: INITIAL    PLAN:  PT FREQUENCY: 2x/week  PT DURATION: 6 weeks  PLANNED INTERVENTIONS: Therapeutic exercises, Therapeutic activity, Neuromuscular  re-education, Patient/Family education, Self Care, Joint mobilization, Aquatic Therapy, Dry Needling, Electrical stimulation, Spinal mobilization, Cryotherapy, Moist heat, Taping, Ultrasound, Manual therapy, and Re-evaluation   PLAN FOR NEXT SESSION: review and perform HEP.  Cont with manual therapy, shoulder ROM, and scapular/RTC strengthening and stabilization.   Leigh Minerva III PT, DPT 06/01/24 4:19 PM

## 2024-06-06 ENCOUNTER — Ambulatory Visit (HOSPITAL_BASED_OUTPATIENT_CLINIC_OR_DEPARTMENT_OTHER): Payer: Self-pay | Attending: Orthopaedic Surgery

## 2024-06-06 ENCOUNTER — Encounter (HOSPITAL_BASED_OUTPATIENT_CLINIC_OR_DEPARTMENT_OTHER): Payer: Self-pay

## 2024-06-06 DIAGNOSIS — M25612 Stiffness of left shoulder, not elsewhere classified: Secondary | ICD-10-CM | POA: Diagnosis not present

## 2024-06-06 DIAGNOSIS — M6281 Muscle weakness (generalized): Secondary | ICD-10-CM | POA: Insufficient documentation

## 2024-06-06 DIAGNOSIS — M25512 Pain in left shoulder: Secondary | ICD-10-CM | POA: Diagnosis not present

## 2024-06-06 NOTE — Therapy (Signed)
 OUTPATIENT PHYSICAL THERAPY SHOULDER EVALUATION   Patient Name: Bianca Phillips MRN: [id number] DOB:11-05-1955, 68 y.o., female Today's Date: 06/06/2024  END OF SESSION:  PT End of Session - 06/06/24 1337     Visit Number 2    Number of Visits 12    Date for PT Re-Evaluation 07/12/24    Authorization Type HTA    PT Start Time 1349    PT Stop Time 1430    PT Time Calculation (min) 41 min    Activity Tolerance Patient tolerated treatment well    Behavior During Therapy [facility] for tasks assessed/performed           Past Medical History:  Diagnosis Date   Acute non-recurrent frontal sinusitis 07/14/2022   Adhesive capsulitis of right shoulder 02/05/2021   Allergy    Complication of anesthesia    Constipation    Esophagitis, [place] grade C 06/01/2023   GAD (generalized anxiety disorder)    GERD (gastroesophageal reflux disease)    Heart murmur    Heart palpitations    evaluated by cardiologist--- dr h. hobart, note in epic 04-03-2021, normal echo and monitor showed NSR w/ SVT   History of COVID-19    per pt had covid twice in 12/ 2020 with mild symptoms with residual intermittant loss of taste;  and 04/ 2022 with mild symptoms that resolved   History of hyperthyroidism    age 8 s/p RAI   History of kidney stones    History of recurrent UTIs    History of repair of hiatal hernia 02/2020   Hyperlipidemia    Hypothyroidism, postablative    age 42  s/p RAI for hyperthyroidism;  followed by pcp   IDA (iron deficiency anemia)    Itching in the vaginal area 02/05/2021   Kidney stone 12/16/2014   MDD (major depressive disorder)    OA (osteoarthritis)    Pain in left shin 01/19/2023   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   Right ureteral calculus    Seasonal allergic rhinitis    Type 2 diabetes mellitus (HCC)    followed by pcp    (07-14-2021  per pt checks blood sugar at home 2-3 times weekly,  fasting sugar --120-130)   Urgency of urination    Wears hearing aid in  both ears    Past Surgical History:  Procedure Laterality Date   CESAREAN SECTION     x2  last one 1986   CYSTOSCOPY/URETEROSCOPY/HOLMIUM LASER/STENT PLACEMENT  2012   approx   CYSTOSCOPY/URETEROSCOPY/HOLMIUM LASER/STENT PLACEMENT Right 07/20/2021   Procedure: CYSTOSCOPY/RETROGRADE/URETEROSCOPY/HOLMIUM LASER/STENT PLACEMENT;  Surgeon: Selma Donnice SAUNDERS, MD;  Location: [facility];  Service: Urology;  Laterality: Right;  ONLY NEEDS 45 MIN   LAPAROSCOPIC NISSEN FUNDOPLICATION  02/2020   TOTAL ABDOMINAL HYSTERECTOMY W/ BILATERAL SALPINGOOPHORECTOMY  2002   TOTAL HIP ARTHROPLASTY Right 08/30/2021   Procedure: TOTAL HIP ARTHROPLASTY ANTERIOR APPROACH;  Surgeon: Vernetta Lonni GRADE, MD;  Location: [facility] OR;  Service: Orthopedics;  Laterality: Right;   UPPER GASTROINTESTINAL ENDOSCOPY     Patient Active Problem List   Diagnosis Date Noted   Elevated blood pressure reading in office without diagnosis of hypertension 05/30/2024   Left shoulder pain 05/30/2024   Trigger finger, right middle finger 06/24/2023   Gastric ulcer without hemorrhage or perforation 06/01/2023   Chronic constipation 01/19/2023   Closed right hip fracture (HCC) 08/29/2021   Insomnia 06/24/2021   Candida lusitanea infection 03/16/2021   Chronic kidney disease (CKD) stage G3b/A2, moderately decreased glomerular  filtration rate (GFR) between 30-44 mL/min/1.73 square meter and albuminuria creatinine ratio between 30-299 mg/g (HCC) 03/16/2021   Acute midline low back pain without sciatica 02/24/2021   Tachycardia 02/05/2021   Generalized anxiety disorder 02/05/2021   Heart palpitations 02/05/2021   Urinary frequency 02/05/2021   Type 2 diabetes mellitus with hyperglycemia, without long-term current use of insulin  (HCC) 02/05/2021   Postoperative hypothyroidism 02/05/2021   Hyperlipidemia associated with type 2 diabetes mellitus (HCC) 02/05/2021   Intractable migraine without aura and without status migrainosus  02/05/2021   Seasonal allergic rhinitis due to pollen 02/05/2021   Gastroesophageal reflux disease 02/27/2020   Lump of right breast 11/28/2019    PCP: Oris Camie BRAVO, NP  REFERRING PROVIDER: Vernetta Lonni GRADE, MD   REFERRING DIAG: [id number] (ICD-10-CM) - Acute pain of left shoulder M75.42 (ICD-10-CM) - Impingement syndrome of left shoulder  THERAPY DIAG:  Stiffness of left shoulder, not elsewhere classified  Muscle weakness (generalized)  Left shoulder pain, unspecified chronicity  Rationale for Evaluation and Treatment: Rehabilitation  ONSET DATE: July 2025  SUBJECTIVE:                                                                                                                                                                                      SUBJECTIVE STATEMENT:  Pt reports improved L shoulder pain since her injection. 1/10 pain level with activity.    Eval: Pt reports her pain began 1 month ago with no specific MOI.  Pt woke up with pain.  Pt took anti-inflammatories which helped a little.  MD informed her she could use a sling if it helped.  She used the sling for a little bit which did help.  Pt had x rays and was told she had arthritis.  Pt had an injection in shoulder on 8/20 which took a few days for pain relief.  Pt alternates ice and heat which helps.   MD note indicated Examination of her left shoulder does show evidence of impingement.   Pt was seen in PT for R shoulder adhesive capsulitis last year.  Pt did very well in PT and states her shoulder is feels good.    Pt swims 3x/wk at the [facility] [place]  Pt has minimal pain with swimming.    Pt has pain with reaching arm overhead and donning shirt.  Pt is limited and painful with reaching behind back.  Pt has a little pain with hair care.  Pt carries less weight with shopping bags.  Pt took Ibuprofen before coming to PT.        Hand dominance: Right  PERTINENT HISTORY: R shoulder adhesive  capsulitis in 2024 DM type 2 R THA in 2022 depression Anemia  PAIN:  NPRS:  0/10 at rest, 1-2/10 with movement, 5/10 worst. Worst pain with unexpected movements Location:  L shoulder and proximal UE  PRECAUTIONS: Other: R THA   WEIGHT BEARING RESTRICTIONS: No  FALLS:  Has patient fallen in last 6 months? No  LIVING ENVIRONMENT: Lives with: lives alone   OCCUPATION: Retired  PLOF: Independent  PATIENT GOALS:to perform her normal act's including swimming without pain   OBJECTIVE:  Note: Objective measures were completed at Evaluation unless otherwise noted.  DIAGNOSTIC FINDINGS:  X rays on 04/23/24 FINDINGS: There is no evidence of fracture or dislocation. Glenohumeral degenerative change with spurring from the humeral head and mild joint space narrowing. There is minimal acromioclavicular degenerative spurring. No evidence of erosion or focal bone abnormality. Soft tissues are unremarkable.   IMPRESSION: Mild glenohumeral and acromioclavicular degenerative change.  PATIENT SURVEYS:  UEFI:  68/80  COGNITION: Overall cognitive status: Within functional limits for tasks assessed      UPPER EXTREMITY ROM:   AROM Right Eval  Left eval  Shoulder flexion 136 138 with pain PROM:  160  Shoulder extension 134 146  Shoulder abduction 96 135  Shoulder adduction    Shoulder internal rotation  65  Shoulder external rotation  44/49 AROM/PROM  Elbow flexion    Elbow extension    Wrist flexion    Wrist extension    Wrist ulnar deviation    Wrist radial deviation    Wrist pronation    Wrist supination    (Blank rows = not tested)  UPPER EXTREMITY MMT:  MMT Right eval Left eval  Shoulder flexion    Shoulder extension    Shoulder abduction    Shoulder adduction    Shoulder internal rotation  4-/5  Shoulder external rotation  4/5  Middle trapezius    Lower trapezius    Elbow flexion    Elbow extension    Wrist flexion    Wrist extension    Wrist  ulnar deviation    Wrist radial deviation    Wrist pronation    Wrist supination    Grip strength (lbs)    (Blank rows = not tested)  SHOULDER SPECIAL TESTS: Impingement tests: Neer's and Hawkin's [place]:  negative on R, positive on L   PALPATION:  No tenderness with palpation t/o L shoulder                                                                                                                             TREATMENT:   06/06/24  Pulleys 1'ea flexion/abd Seated punches 2# 2x10 Seated active shoulder flexion 1# 2x10 Seated biceps curls 2# 2x10 Theraband row GTB 2x15 Theraband extension RTB 2x15 Bent over row 3# 2x10 Bent over extension 3# 2x10 Doorway pec stretching Seated bil ER RTB 2x20 Stnding triceps extension GTB 2x10 Supine SA punch 2# 2x10 Supine wand flexion 2# 2x10 PROM  L shoulder   Eval: Pt performed supine serratus punches 2x10, supine shoulder ABC x 1 rep, and prone extension 2x10.  Pt received a HEP handout and was educated in correct form and appropriate frequency.  PT instructed she should not have pain with HEP.   PATIENT EDUCATION: Education details: dx, relevant anatomy, objective findings, POC, rationale of interventions, and HEP.  Person educated: Patient Education method: Explanation, Demonstration, Tactile cues, Verbal cues, and Handouts Education comprehension: verbalized understanding, returned demonstration, verbal cues required, tactile cues required, and needs further education  HOME EXERCISE PROGRAM: Access Code: [email or web address] URL: [email or web address] Date: 06/01/2024 Prepared by: Mose Minerva  Exercises - Supine Single Arm Shoulder Protraction  - 1 x daily - 7 x weekly - 2 sets - 10 reps - Supine Shoulder Alphabet  - 1 x daily - 7 x weekly - 1 reps - Prone Shoulder Extension - Single Arm  - 1 x daily - 6-7 x weekly - 2 sets - 10 reps  ASSESSMENT:  CLINICAL IMPRESSION:  Pt with pain free AROM in all planes aside  from IR behind back. Good tolerance for strengthening, NMR and mobility program. No c/o pain with exercises though some fatigue present. Cuing required for pace and technique with exercises. Will continue to progress as tolerated.   Eval: Patient is a 68 y.o. female with a dx of L shoulder pain with MD note indicating evidence of impingement.  Pt received PT for R shoulder adhesive capsulitis last year and responded well to PT.  Pt has pain with ADL's including dressing and hair care.  She has pain with reaching arm overhead and is limited with reaching behind back.  She carries less weight with shopping bags. Pt enjoys swimming and consistently swims weekly.  She reports minimal pain with swimming.  Pt has limited L shoulder ROM and weakness in RTC.  Pt should benefit from skilled PT to address impairments and overall function.   OBJECTIVE IMPAIRMENTS: decreased ROM, decreased strength, hypomobility, impaired flexibility, impaired UE functional use, and pain.   ACTIVITY LIMITATIONS: carrying, lifting, dressing, reach over head, and hygiene/grooming  PARTICIPATION LIMITATIONS: shopping and swimming  PERSONAL FACTORS: 1-2 comorbidities: DM type 2, R shoulder adhesive capsulitis are also affecting patient's functional outcome.   REHAB POTENTIAL: Good  CLINICAL DECISION MAKING: Stable/uncomplicated  EVALUATION COMPLEXITY: Low   GOALS:   SHORT TERM GOALS: Target date: 06/21/2024   Pt will be independent and compliant with HEP for improved pain, ROM, strength, and function.   Baseline: Goal status: INITIAL  2.  Pt will demo at least a 10 deg increase in L shoulder flexion AROM and demo at least 50 deg in ER AROM for improved shoulder mobility and stiffness.  Baseline:  Goal status: INITIAL  3.  Pt will report worst pain to be no > 3/10 for improved pain with unexpected movements. Baseline:  Goal status: INITIAL  4.  Pt will be able to perform hair care without difficulty and without  increased pain.  Baseline:  Goal status: INITIAL Target date:  06/28/24   LONG TERM GOALS: Target date: 07/11/2024  Pt will report no pain with swimming. Baseline:  Goal status: INITIAL  2.  Pt will be able to don/doff shirt without increased pain and difficulty.   Baseline:  Goal status: INITIAL  3.  Pt will report she is able to perform her normal overhead activities without significant pain.  Baseline:  Goal status: INITIAL  4.  Pt will demo improved Lshoulder strength to 5/5  MMT in flexion, scaption, ER, and IR for improved tolerance with functional carrying and lifting.  Baseline:  Goal status: INITIAL    PLAN:  PT FREQUENCY: 2x/week  PT DURATION: 6 weeks  PLANNED INTERVENTIONS: Therapeutic exercises, Therapeutic activity, Neuromuscular re-education, Patient/Family education, Self Care, Joint mobilization, Aquatic Therapy, Dry Needling, Electrical stimulation, Spinal mobilization, Cryotherapy, Moist heat, Taping, Ultrasound, Manual therapy, and Re-evaluation   PLAN FOR NEXT SESSION: review and perform HEP.  Cont with manual therapy, shoulder ROM, and scapular/RTC strengthening and stabilization.  Asberry Rodes, PTA  06/06/24 2:58 PM

## 2024-06-07 ENCOUNTER — Ambulatory Visit
Admission: RE | Admit: 2024-06-07 | Discharge: 2024-06-07 | Disposition: A | Discharge status: Home / Self Care | Source: Ambulatory Visit | Attending: Nurse Practitioner | Admitting: Nurse Practitioner

## 2024-06-07 DIAGNOSIS — Z1231 Encounter for screening mammogram for malignant neoplasm of breast: Secondary | ICD-10-CM

## 2024-06-09 ENCOUNTER — Encounter (HOSPITAL_BASED_OUTPATIENT_CLINIC_OR_DEPARTMENT_OTHER): Admitting: Physical Therapy

## 2024-06-11 ENCOUNTER — Telehealth: Payer: Self-pay

## 2024-06-11 NOTE — Telephone Encounter (Signed)
 Pt is requesting lab done prior to appt on 06/26/24. Please advise.

## 2024-06-13 ENCOUNTER — Ambulatory Visit: Payer: Self-pay | Admitting: Nurse Practitioner

## 2024-06-13 DIAGNOSIS — R002 Palpitations: Secondary | ICD-10-CM

## 2024-06-13 DIAGNOSIS — F419 Anxiety disorder, unspecified: Secondary | ICD-10-CM

## 2024-06-13 DIAGNOSIS — R000 Tachycardia, unspecified: Secondary | ICD-10-CM

## 2024-06-15 ENCOUNTER — Ambulatory Visit (HOSPITAL_BASED_OUTPATIENT_CLINIC_OR_DEPARTMENT_OTHER)

## 2024-06-15 ENCOUNTER — Encounter (HOSPITAL_BASED_OUTPATIENT_CLINIC_OR_DEPARTMENT_OTHER): Payer: Self-pay

## 2024-06-15 ENCOUNTER — Other Ambulatory Visit: Payer: Self-pay | Admitting: "Endocrinology

## 2024-06-15 DIAGNOSIS — M25512 Pain in left shoulder: Secondary | ICD-10-CM

## 2024-06-15 DIAGNOSIS — E0789 Other specified disorders of thyroid: Secondary | ICD-10-CM

## 2024-06-15 DIAGNOSIS — R7989 Other specified abnormal findings of blood chemistry: Secondary | ICD-10-CM

## 2024-06-15 DIAGNOSIS — M6281 Muscle weakness (generalized): Secondary | ICD-10-CM

## 2024-06-15 DIAGNOSIS — M25612 Stiffness of left shoulder, not elsewhere classified: Secondary | ICD-10-CM | POA: Diagnosis not present

## 2024-06-15 NOTE — Therapy (Signed)
 OUTPATIENT PHYSICAL THERAPY SHOULDER TREATMENT   Patient Name: Bianca Phillips MRN: [id number] DOB:March 05, 1956, 68 y.o., female Today's Date: 06/15/2024  END OF SESSION:  PT End of Session - 06/15/24 1058     Visit Number 3    Number of Visits 12    Date for PT Re-Evaluation 07/12/24    Authorization Type HTA    PT Start Time 1100    PT Stop Time 1145    PT Time Calculation (min) 45 min    Activity Tolerance Patient tolerated treatment well    Behavior During Therapy [employer] for tasks assessed/performed           Past Medical History:  Diagnosis Date   Acute non-recurrent frontal sinusitis 07/14/2022   Adhesive capsulitis of right shoulder 02/05/2021   Allergy    Complication of anesthesia    Constipation    Esophagitis, [place] grade C 06/01/2023   GAD (generalized anxiety disorder)    GERD (gastroesophageal reflux disease)    Heart murmur    Heart palpitations    evaluated by cardiologist--- dr h. hobart, note in epic 04-03-2021, normal echo and monitor showed NSR w/ SVT   History of COVID-19    per pt had covid twice in 12/ 2020 with mild symptoms with residual intermittant loss of taste;  and 04/ 2022 with mild symptoms that resolved   History of hyperthyroidism    age 62 s/p RAI   History of kidney stones    History of recurrent UTIs    History of repair of hiatal hernia 02/2020   Hyperlipidemia    Hypothyroidism, postablative    age 65  s/p RAI for hyperthyroidism;  followed by pcp   IDA (iron deficiency anemia)    Itching in the vaginal area 02/05/2021   Kidney stone 12/16/2014   MDD (major depressive disorder)    OA (osteoarthritis)    Pain in left shin 01/19/2023   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   Right ureteral calculus    Seasonal allergic rhinitis    Type 2 diabetes mellitus (HCC)    followed by pcp    (07-14-2021  per pt checks blood sugar at home 2-3 times weekly,  fasting sugar --120-130)   Urgency of urination    Wears hearing aid in  both ears    Past Surgical History:  Procedure Laterality Date   CESAREAN SECTION     x2  last one 1986   CYSTOSCOPY/URETEROSCOPY/HOLMIUM LASER/STENT PLACEMENT  2012   approx   CYSTOSCOPY/URETEROSCOPY/HOLMIUM LASER/STENT PLACEMENT Right 07/20/2021   Procedure: CYSTOSCOPY/RETROGRADE/URETEROSCOPY/HOLMIUM LASER/STENT PLACEMENT;  Surgeon: Selma Donnice SAUNDERS, MD;  Location: [facility];  Service: Urology;  Laterality: Right;  ONLY NEEDS 45 MIN   LAPAROSCOPIC NISSEN FUNDOPLICATION  02/2020   TOTAL ABDOMINAL HYSTERECTOMY W/ BILATERAL SALPINGOOPHORECTOMY  2002   TOTAL HIP ARTHROPLASTY Right 08/30/2021   Procedure: TOTAL HIP ARTHROPLASTY ANTERIOR APPROACH;  Surgeon: Vernetta Lonni GRADE, MD;  Location: [facility] OR;  Service: Orthopedics;  Laterality: Right;   UPPER GASTROINTESTINAL ENDOSCOPY     Patient Active Problem List   Diagnosis Date Noted   Elevated blood pressure reading in office without diagnosis of hypertension 05/30/2024   Left shoulder pain 05/30/2024   Trigger finger, right middle finger 06/24/2023   Gastric ulcer without hemorrhage or perforation 06/01/2023   Chronic constipation 01/19/2023   Closed right hip fracture (HCC) 08/29/2021   Insomnia 06/24/2021   Candida lusitanea infection 03/16/2021   Chronic kidney disease (CKD) stage G3b/A2, moderately decreased glomerular  filtration rate (GFR) between 30-44 mL/min/1.73 square meter and albuminuria creatinine ratio between 30-299 mg/g (HCC) 03/16/2021   Acute midline low back pain without sciatica 02/24/2021   Tachycardia 02/05/2021   Generalized anxiety disorder 02/05/2021   Heart palpitations 02/05/2021   Urinary frequency 02/05/2021   Type 2 diabetes mellitus with hyperglycemia, without long-term current use of insulin  (HCC) 02/05/2021   Postoperative hypothyroidism 02/05/2021   Hyperlipidemia associated with type 2 diabetes mellitus (HCC) 02/05/2021   Intractable migraine without aura and without status migrainosus  02/05/2021   Seasonal allergic rhinitis due to pollen 02/05/2021   Gastroesophageal reflux disease 02/27/2020   Lump of right breast 11/28/2019    PCP: Oris Camie BRAVO, NP  REFERRING PROVIDER: Vernetta Lonni GRADE, MD   REFERRING DIAG: [phone] (ICD-10-CM) - Acute pain of left shoulder M75.42 (ICD-10-CM) - Impingement syndrome of left shoulder  THERAPY DIAG:  Left shoulder pain, unspecified chronicity  Stiffness of left shoulder, not elsewhere classified  Muscle weakness (generalized)  Rationale for Evaluation and Treatment: Rehabilitation  ONSET DATE: July 2025  SUBJECTIVE:                                                                                                                                                                                      SUBJECTIVE STATEMENT:  1/10 pain level at entry. Pt denies soreness after last session.    Eval: Pt reports her pain began 1 month ago with no specific MOI.  Pt woke up with pain.  Pt took anti-inflammatories which helped a little.  MD informed her she could use a sling if it helped.  She used the sling for a little bit which did help.  Pt had x rays and was told she had arthritis.  Pt had an injection in shoulder on 8/20 which took a few days for pain relief.  Pt alternates ice and heat which helps.   MD note indicated Examination of her left shoulder does show evidence of impingement.   Pt was seen in PT for R shoulder adhesive capsulitis last year.  Pt did very well in PT and states her shoulder is feels good.    Pt swims 3x/wk at the [facility] [place]  Pt has minimal pain with swimming.    Pt has pain with reaching arm overhead and donning shirt.  Pt is limited and painful with reaching behind back.  Pt has a little pain with hair care.  Pt carries less weight with shopping bags.  Pt took Ibuprofen before coming to PT.        Hand dominance: Right  PERTINENT HISTORY: R shoulder adhesive capsulitis in 2024 DM  type 2 R THA in 2022 depression Anemia  PAIN:  NPRS:  0/10 at rest, 1-2/10 with movement, 5/10 worst. Worst pain with unexpected movements Location:  L shoulder and proximal UE  PRECAUTIONS: Other: R THA   WEIGHT BEARING RESTRICTIONS: No  FALLS:  Has patient fallen in last 6 months? No  LIVING ENVIRONMENT: Lives with: lives alone   OCCUPATION: Retired  PLOF: Independent  PATIENT GOALS:to perform her normal act's including swimming without pain   OBJECTIVE:  Note: Objective measures were completed at Evaluation unless otherwise noted.  DIAGNOSTIC FINDINGS:  X rays on 04/23/24 FINDINGS: There is no evidence of fracture or dislocation. Glenohumeral degenerative change with spurring from the humeral head and mild joint space narrowing. There is minimal acromioclavicular degenerative spurring. No evidence of erosion or focal bone abnormality. Soft tissues are unremarkable.   IMPRESSION: Mild glenohumeral and acromioclavicular degenerative change.  PATIENT SURVEYS:  UEFI:  68/80  COGNITION: Overall cognitive status: Within functional limits for tasks assessed      UPPER EXTREMITY ROM:   AROM Right Eval  Left eval  Shoulder flexion 136 138 with pain PROM:  160  Shoulder extension 134 146  Shoulder abduction 96 135  Shoulder adduction    Shoulder internal rotation  65  Shoulder external rotation  44/49 AROM/PROM  Elbow flexion    Elbow extension    Wrist flexion    Wrist extension    Wrist ulnar deviation    Wrist radial deviation    Wrist pronation    Wrist supination    (Blank rows = not tested)  UPPER EXTREMITY MMT:  MMT Right eval Left eval  Shoulder flexion    Shoulder extension    Shoulder abduction    Shoulder adduction    Shoulder internal rotation  4-/5  Shoulder external rotation  4/5  Middle trapezius    Lower trapezius    Elbow flexion    Elbow extension    Wrist flexion    Wrist extension    Wrist ulnar deviation    Wrist  radial deviation    Wrist pronation    Wrist supination    Grip strength (lbs)    (Blank rows = not tested)  SHOULDER SPECIAL TESTS: Impingement tests: Neer's and Hawkin's [place]:  negative on R, positive on L   PALPATION:  No tenderness with palpation t/o L shoulder                                                                                                                             TREATMENT:  06/15/24  Pulleys 2'ea flexion/abd Seated punches 2# 2x10 Seated active shoulder flexion 1# 2x10 Seated biceps curls 3# 2x10 Theraband row GTB 2x15 Theraband extension RTB 2x10 Bent over row 3# 2x10 Bent over extension 3# 2x10 Doorway pec stretching (modified) Seated bil ER RTB 2x10 Stnding triceps extension GTB 2x10 Supine SA punch 2# 2x15 Supine active flexion 1# 2x10 (trialed 2# but had discomfort)  PROM L shoulder UBE L1 each way, then 30sec each way   06/06/24  Pulleys 1'ea flexion/abd Seated punches 2# 2x10 Seated active shoulder flexion 1# 2x10 Seated biceps curls 2# 2x10 Theraband row GTB 2x15 Theraband extension RTB 2x15 Bent over row 3# 2x10 Bent over extension 3# 2x10 Doorway pec stretching Seated bil ER RTB 2x20 Stnding triceps extension GTB 2x10 Supine SA punch 2# 2x10 Supine wand flexion 2# 2x10 PROM L shoulder   Eval: Pt performed supine serratus punches 2x10, supine shoulder ABC x 1 rep, and prone extension 2x10.  Pt received a HEP handout and was educated in correct form and appropriate frequency.  PT instructed she should not have pain with HEP.   PATIENT EDUCATION: Education details: dx, relevant anatomy, objective findings, POC, rationale of interventions, and HEP.  Person educated: Patient Education method: Explanation, Demonstration, Tactile cues, Verbal cues, and Handouts Education comprehension: verbalized understanding, returned demonstration, verbal cues required, tactile cues required, and needs further education  HOME EXERCISE  PROGRAM: Access Code: [email or web address] URL: [email or web address] Date: 06/01/2024 Prepared by: Mose Minerva  Exercises - Supine Single Arm Shoulder Protraction  - 1 x daily - 7 x weekly - 2 sets - 10 reps - Supine Shoulder Alphabet  - 1 x daily - 7 x weekly - 1 reps - Prone Shoulder Extension - Single Arm  - 1 x daily - 6-7 x weekly - 2 sets - 10 reps  ASSESSMENT:  CLINICAL IMPRESSION:  Good tolerance for UE and scapular strengthening interventions. She requires tactile cuing for proper positioning and technqiue with exercises. Also requires cuing for slower pace and more controlled movement with exercises. Able to initiate UBE oday with good tolerance, but did fatigue and unable to complete full duration (performed 3 min instead). Pt will benefit from additional PT to address limitations in ROM, strength, and NMC.  Eval: Patient is a 68 y.o. female with a dx of L shoulder pain with MD note indicating evidence of impingement.  Pt received PT for R shoulder adhesive capsulitis last year and responded well to PT.  Pt has pain with ADL's including dressing and hair care.  She has pain with reaching arm overhead and is limited with reaching behind back.  She carries less weight with shopping bags. Pt enjoys swimming and consistently swims weekly.  She reports minimal pain with swimming.  Pt has limited L shoulder ROM and weakness in RTC.  Pt should benefit from skilled PT to address impairments and overall function.   OBJECTIVE IMPAIRMENTS: decreased ROM, decreased strength, hypomobility, impaired flexibility, impaired UE functional use, and pain.   ACTIVITY LIMITATIONS: carrying, lifting, dressing, reach over head, and hygiene/grooming  PARTICIPATION LIMITATIONS: shopping and swimming  PERSONAL FACTORS: 1-2 comorbidities: DM type 2, R shoulder adhesive capsulitis are also affecting patient's functional outcome.   REHAB POTENTIAL: Good  CLINICAL DECISION MAKING:  Stable/uncomplicated  EVALUATION COMPLEXITY: Low   GOALS:   SHORT TERM GOALS: Target date: 06/21/2024   Pt will be independent and compliant with HEP for improved pain, ROM, strength, and function.   Baseline: Goal status: INITIAL  2.  Pt will demo at least a 10 deg increase in L shoulder flexion AROM and demo at least 50 deg in ER AROM for improved shoulder mobility and stiffness.  Baseline:  Goal status: INITIAL  3.  Pt will report worst pain to be no > 3/10 for improved pain with unexpected movements. Baseline:  Goal status: INITIAL  4.  Pt will be able  to perform hair care without difficulty and without increased pain.  Baseline:  Goal status: INITIAL Target date:  06/28/24   LONG TERM GOALS: Target date: 07/11/2024  Pt will report no pain with swimming. Baseline:  Goal status: INITIAL  2.  Pt will be able to don/doff shirt without increased pain and difficulty.   Baseline:  Goal status: INITIAL  3.  Pt will report she is able to perform her normal overhead activities without significant pain.  Baseline:  Goal status: INITIAL  4.  Pt will demo improved Lshoulder strength to 5/5 MMT in flexion, scaption, ER, and IR for improved tolerance with functional carrying and lifting.  Baseline:  Goal status: INITIAL    PLAN:  PT FREQUENCY: 2x/week  PT DURATION: 6 weeks  PLANNED INTERVENTIONS: Therapeutic exercises, Therapeutic activity, Neuromuscular re-education, Patient/Family education, Self Care, Joint mobilization, Aquatic Therapy, Dry Needling, Electrical stimulation, Spinal mobilization, Cryotherapy, Moist heat, Taping, Ultrasound, Manual therapy, and Re-evaluation   PLAN FOR NEXT SESSION: review and perform HEP.  Cont with manual therapy, shoulder ROM, and scapular/RTC strengthening and stabilization.  Asberry Rodes, PTA  06/15/24 1:12 PM

## 2024-06-18 ENCOUNTER — Other Ambulatory Visit: Payer: Self-pay | Admitting: "Endocrinology

## 2024-06-18 DIAGNOSIS — R7989 Other specified abnormal findings of blood chemistry: Secondary | ICD-10-CM

## 2024-06-18 MED ORDER — PROPRANOLOL HCL ER 60 MG PO CP24: 60.0000 mg | ORAL_CAPSULE | Freq: Every day | ORAL | 1 refills | Status: AC

## 2024-06-18 NOTE — Telephone Encounter (Signed)
 Please let Bianca Phillips know I have sent in a low dose of the propranolol  for her to try to see if this helps with heart rate, BP, tremors. I would like her to keep an eye on her BP and heart rate like she was and let me know in a couple of weeks what it is looking like.

## 2024-06-18 NOTE — Addendum Note (Signed)
 Addended by: Reeanna Acri, CAMIE E on: 06/18/2024 08:26 AM   Modules accepted: Orders

## 2024-06-19 ENCOUNTER — Encounter (HOSPITAL_BASED_OUTPATIENT_CLINIC_OR_DEPARTMENT_OTHER): Admitting: Physical Therapy

## 2024-06-19 NOTE — Telephone Encounter (Signed)
 Patient is scheduled for lab appointment on 06/20/2024 and follow up with Dr. Dartha on 06/27/2024.

## 2024-06-20 ENCOUNTER — Ambulatory Visit: Admitting: Orthopaedic Surgery

## 2024-06-20 ENCOUNTER — Encounter: Payer: Self-pay | Admitting: Orthopaedic Surgery

## 2024-06-20 ENCOUNTER — Other Ambulatory Visit

## 2024-06-20 DIAGNOSIS — G8929 Other chronic pain: Secondary | ICD-10-CM

## 2024-06-20 DIAGNOSIS — E0789 Other specified disorders of thyroid: Secondary | ICD-10-CM | POA: Diagnosis not present

## 2024-06-20 DIAGNOSIS — M25512 Pain in left shoulder: Secondary | ICD-10-CM | POA: Diagnosis not present

## 2024-06-20 DIAGNOSIS — M7542 Impingement syndrome of left shoulder: Secondary | ICD-10-CM | POA: Diagnosis not present

## 2024-06-20 DIAGNOSIS — R7989 Other specified abnormal findings of blood chemistry: Secondary | ICD-10-CM | POA: Diagnosis not present

## 2024-06-20 NOTE — Progress Notes (Signed)
 The patient is a 68 year old female that is seen in follow-up after having a left shoulder subacromial injection combined with physical therapy on her shoulder.  She is very active.  She has been to aquatic therapy and [place].  She has been going to physical therapy and states she is doing very well.  Both shoulders have a lot of clicking and grinding but excellent range of motion and she does not seem to have a lot of pain today at all.  Since she is doing so well follow-up can be as needed.  If things worsen she knows to reach out and let us  know.

## 2024-06-21 ENCOUNTER — Ambulatory Visit (HOSPITAL_BASED_OUTPATIENT_CLINIC_OR_DEPARTMENT_OTHER)

## 2024-06-21 ENCOUNTER — Encounter (HOSPITAL_BASED_OUTPATIENT_CLINIC_OR_DEPARTMENT_OTHER): Payer: Self-pay

## 2024-06-21 DIAGNOSIS — M6281 Muscle weakness (generalized): Secondary | ICD-10-CM

## 2024-06-21 DIAGNOSIS — M25612 Stiffness of left shoulder, not elsewhere classified: Secondary | ICD-10-CM | POA: Diagnosis not present

## 2024-06-21 DIAGNOSIS — M25512 Pain in left shoulder: Secondary | ICD-10-CM

## 2024-06-21 LAB — T3, FREE: T3, Free: 4.7 pg/mL — ABNORMAL HIGH (ref 2.3–4.2)

## 2024-06-21 LAB — T4, FREE: Free T4: 2.7 ng/dL — ABNORMAL HIGH (ref 0.8–1.8)

## 2024-06-21 LAB — TSH: TSH: 5.5 m[IU]/L — ABNORMAL HIGH (ref 0.40–4.50)

## 2024-06-21 NOTE — Therapy (Signed)
 OUTPATIENT PHYSICAL THERAPY SHOULDER TREATMENT   Patient Name: Bianca Phillips MRN: [id number] DOB:[birth year], 68 y.o., female Today's Date: 06/21/2024  END OF SESSION:  PT End of Session - 06/21/24 1018     Visit Number 4    Number of Visits 12    Date for Recertification  07/12/24    Authorization Type HTA    PT Start Time 1018    PT Stop Time 1059    PT Time Calculation (min) 41 min    Activity Tolerance Patient tolerated treatment well    Behavior During Therapy [facility] for tasks assessed/performed            Past Medical History:  Diagnosis Date   Acute non-recurrent frontal sinusitis 07/14/2022   Adhesive capsulitis of right shoulder 02/05/2021   Allergy    Complication of anesthesia    Constipation    Esophagitis, [place] grade C 06/01/2023   GAD (generalized anxiety disorder)    GERD (gastroesophageal reflux disease)    Heart murmur    Heart palpitations    evaluated by cardiologist--- dr h. hobart, note in epic 04-03-2021, normal echo and monitor showed NSR w/ SVT   History of COVID-19    per pt had covid twice in 12/ 2020 with mild symptoms with residual intermittant loss of taste;  and 04/ 2022 with mild symptoms that resolved   History of hyperthyroidism    age 24 s/p RAI   History of kidney stones    History of recurrent UTIs    History of repair of hiatal hernia 02/2020   Hyperlipidemia    Hypothyroidism, postablative    age 4  s/p RAI for hyperthyroidism;  followed by pcp   IDA (iron deficiency anemia)    Itching in the vaginal area 02/05/2021   Kidney stone 12/16/2014   MDD (major depressive disorder)    OA (osteoarthritis)    Pain in left shin 01/19/2023   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   Right ureteral calculus    Seasonal allergic rhinitis    Type 2 diabetes mellitus (HCC)    followed by pcp    (07-14-2021  per pt checks blood sugar at home 2-3 times weekly,  fasting sugar --120-130)   Urgency of urination    Wears hearing aid  in both ears    Past Surgical History:  Procedure Laterality Date   CESAREAN SECTION     x2  last one 1986   CYSTOSCOPY/URETEROSCOPY/HOLMIUM LASER/STENT PLACEMENT  2012   approx   CYSTOSCOPY/URETEROSCOPY/HOLMIUM LASER/STENT PLACEMENT Right 07/20/2021   Procedure: CYSTOSCOPY/RETROGRADE/URETEROSCOPY/HOLMIUM LASER/STENT PLACEMENT;  Surgeon: Selma Donnice SAUNDERS, MD;  Location: [facility];  Service: Urology;  Laterality: Right;  ONLY NEEDS 45 MIN   LAPAROSCOPIC NISSEN FUNDOPLICATION  02/2020   TOTAL ABDOMINAL HYSTERECTOMY W/ BILATERAL SALPINGOOPHORECTOMY  2002   TOTAL HIP ARTHROPLASTY Right 08/30/2021   Procedure: TOTAL HIP ARTHROPLASTY ANTERIOR APPROACH;  Surgeon: Vernetta Lonni GRADE, MD;  Location: [facility] OR;  Service: Orthopedics;  Laterality: Right;   UPPER GASTROINTESTINAL ENDOSCOPY     Patient Active Problem List   Diagnosis Date Noted   Elevated blood pressure reading in office without diagnosis of hypertension 05/30/2024   Left shoulder pain 05/30/2024   Trigger finger, right middle finger 06/24/2023   Gastric ulcer without hemorrhage or perforation 06/01/2023   Chronic constipation 01/19/2023   Closed right hip fracture (HCC) 08/29/2021   Insomnia 06/24/2021   Candida lusitanea infection 03/16/2021   Chronic kidney disease (CKD) stage G3b/A2, moderately decreased  glomerular filtration rate (GFR) between 30-44 mL/min/1.73 square meter and albuminuria creatinine ratio between 30-299 mg/g (HCC) 03/16/2021   Acute midline low back pain without sciatica 02/24/2021   Tachycardia 02/05/2021   Generalized anxiety disorder 02/05/2021   Heart palpitations 02/05/2021   Urinary frequency 02/05/2021   Type 2 diabetes mellitus with hyperglycemia, without long-term current use of insulin  (HCC) 02/05/2021   Postoperative hypothyroidism 02/05/2021   Hyperlipidemia associated with type 2 diabetes mellitus (HCC) 02/05/2021   Intractable migraine without aura and without status  migrainosus 02/05/2021   Seasonal allergic rhinitis due to pollen 02/05/2021   Gastroesophageal reflux disease 02/27/2020   Lump of right breast 11/28/2019    PCP: Oris Camie BRAVO, NP  REFERRING PROVIDER: Vernetta Lonni GRADE, MD   REFERRING DIAG: [id number] (ICD-10-CM) - Acute pain of left shoulder M75.42 (ICD-10-CM) - Impingement syndrome of left shoulder  THERAPY DIAG:  Left shoulder pain, unspecified chronicity  Stiffness of left shoulder, not elsewhere classified  Muscle weakness (generalized)  Rationale for Evaluation and Treatment: Rehabilitation  ONSET DATE: July 2025  SUBJECTIVE:                                                                                                                                                                                      SUBJECTIVE STATEMENT:  1/10 pain level at entry. Pt denies soreness after last session.    Eval: Pt reports her pain began 1 month ago with no specific MOI.  Pt woke up with pain.  Pt took anti-inflammatories which helped a little.  MD informed her she could use a sling if it helped.  She used the sling for a little bit which did help.  Pt had x rays and was told she had arthritis.  Pt had an injection in shoulder on 8/20 which took a few days for pain relief.  Pt alternates ice and heat which helps.   MD note indicated Examination of her left shoulder does show evidence of impingement.   Pt was seen in PT for R shoulder adhesive capsulitis last year.  Pt did very well in PT and states her shoulder is feels good.    Pt swims 3x/wk at the [facility] [place]  Pt has minimal pain with swimming.    Pt has pain with reaching arm overhead and donning shirt.  Pt is limited and painful with reaching behind back.  Pt has a little pain with hair care.  Pt carries less weight with shopping bags.  Pt took Ibuprofen before coming to PT.        Hand dominance: Right  PERTINENT HISTORY: R shoulder adhesive capsulitis  in  2024 DM type 2 R THA in 2022 depression Anemia  PAIN:  NPRS:  0/10 at rest, 1-2/10 with movement, 5/10 worst. Worst pain with unexpected movements Location:  L shoulder and proximal UE  PRECAUTIONS: Other: R THA   WEIGHT BEARING RESTRICTIONS: No  FALLS:  Has patient fallen in last 6 months? No  LIVING ENVIRONMENT: Lives with: lives alone   OCCUPATION: Retired  PLOF: Independent  PATIENT GOALS:to perform her normal act's including swimming without pain   OBJECTIVE:  Note: Objective measures were completed at Evaluation unless otherwise noted.  DIAGNOSTIC FINDINGS:  X rays on 04/23/24 FINDINGS: There is no evidence of fracture or dislocation. Glenohumeral degenerative change with spurring from the humeral head and mild joint space narrowing. There is minimal acromioclavicular degenerative spurring. No evidence of erosion or focal bone abnormality. Soft tissues are unremarkable.   IMPRESSION: Mild glenohumeral and acromioclavicular degenerative change.  PATIENT SURVEYS:  UEFI:  68/80  COGNITION: Overall cognitive status: Within functional limits for tasks assessed      UPPER EXTREMITY ROM:   AROM Right Eval  Left eval  Shoulder flexion 136 138 with pain PROM:  160  Shoulder extension 134 146  Shoulder abduction 96 135  Shoulder adduction    Shoulder internal rotation  65  Shoulder external rotation  44/49 AROM/PROM  Elbow flexion    Elbow extension    Wrist flexion    Wrist extension    Wrist ulnar deviation    Wrist radial deviation    Wrist pronation    Wrist supination    (Blank rows = not tested)  UPPER EXTREMITY MMT:  MMT Right eval Left eval  Shoulder flexion    Shoulder extension    Shoulder abduction    Shoulder adduction    Shoulder internal rotation  4-/5  Shoulder external rotation  4/5  Middle trapezius    Lower trapezius    Elbow flexion    Elbow extension    Wrist flexion    Wrist extension    Wrist ulnar  deviation    Wrist radial deviation    Wrist pronation    Wrist supination    Grip strength (lbs)    (Blank rows = not tested)  SHOULDER SPECIAL TESTS: Impingement tests: Neer's and Hawkin's [place]:  negative on R, positive on L   PALPATION:  No tenderness with palpation t/o L shoulder                                                                                                                             TREATMENT:   06/21/24 Pulleys 2' ea flexion and abduction Seated punches 2# 2x10 Seated active shoulder flexion 1# 2x10 Seated biceps curls 3# 3x10 Theraband row GTB 2x15 Theraband extension GTB 2x15 Bent over row 3# 2x10 Bent over extension 3# 2x10 Ball rolls at wall 4way 2.2# ball x15ea Seated bil ER RTB 2x10 Stnding triceps extension GTB 2x15 Supine SA punch 2# 2x15  Supine active flexion 1# 3x10OH press 2x10 (trialed with 2#, but painful so removed weight) PROM L shoulder    06/15/24  Pulleys 2'ea flexion/abd Seated punches 2# 2x10 Seated active shoulder flexion 1# 2x10 Seated biceps curls 3# 2x10 Theraband row GTB 2x15 Theraband extension RTB 2x10 Bent over row 3# 2x10 Bent over extension 3# 2x10 Doorway pec stretching (modified) Seated bil ER RTB 2x10 Stnding triceps extension GTB 2x10 Supine SA punch 2# 2x15 Supine active flexion 1# 2x10 (trialed 2# but had discomfort) PROM L shoulder UBE L1 each way, then 30sec each way   06/06/24  Pulleys 1'ea flexion/abd Seated punches 2# 2x10 Seated active shoulder flexion 1# 2x10 Seated biceps curls 2# 2x10 Theraband row GTB 2x15 Theraband extension RTB 2x15 Bent over row 3# 2x10 Bent over extension 3# 2x10 Doorway pec stretching Seated bil ER RTB 2x20 Stnding triceps extension GTB 2x10 Supine SA punch 2# 2x10 Supine wand flexion 2# 2x10 PROM L shoulder   Eval: Pt performed supine serratus punches 2x10, supine shoulder ABC x 1 rep, and prone extension 2x10.  Pt received a HEP handout and  was educated in correct form and appropriate frequency.  PT instructed she should not have pain with HEP.   PATIENT EDUCATION: Education details: dx, relevant anatomy, objective findings, POC, rationale of interventions, and HEP.  Person educated: Patient Education method: Explanation, Demonstration, Tactile cues, Verbal cues, and Handouts Education comprehension: verbalized understanding, returned demonstration, verbal cues required, tactile cues required, and needs further education  HOME EXERCISE PROGRAM: Access Code: [email or web address] URL: [email or web address] Date: 06/01/2024 Prepared by: Mose Minerva  Exercises - Supine Single Arm Shoulder Protraction  - 1 x daily - 7 x weekly - 2 sets - 10 reps - Supine Shoulder Alphabet  - 1 x daily - 7 x weekly - 1 reps - Prone Shoulder Extension - Single Arm  - 1 x daily - 6-7 x weekly - 2 sets - 10 reps  ASSESSMENT:  CLINICAL IMPRESSION:  Pt able to progress with strengthening today with good tolerance. Increased repetitions with supine active flexion to 3x10 without c/o increased pain. Added weighted ball rolls today to challenge [facility] and endurance. With [facility] press when trialed with 2#, she did experience deltoid discomfort. No pain once weight was removed. Advised in tennis ball mobilization to this area at home to decrease muscle tightness. Will continue to progress as tolerated.   Eval: Patient is a 68 y.o. female with a dx of L shoulder pain with MD note indicating evidence of impingement.  Pt received PT for R shoulder adhesive capsulitis last year and responded well to PT.  Pt has pain with ADL's including dressing and hair care.  She has pain with reaching arm overhead and is limited with reaching behind back.  She carries less weight with shopping bags. Pt enjoys swimming and consistently swims weekly.  She reports minimal pain with swimming.  Pt has limited L shoulder ROM and weakness in RTC.  Pt should benefit from skilled PT to  address impairments and overall function.   OBJECTIVE IMPAIRMENTS: decreased ROM, decreased strength, hypomobility, impaired flexibility, impaired UE functional use, and pain.   ACTIVITY LIMITATIONS: carrying, lifting, dressing, reach over head, and hygiene/grooming  PARTICIPATION LIMITATIONS: shopping and swimming  PERSONAL FACTORS: 1-2 comorbidities: DM type 2, R shoulder adhesive capsulitis are also affecting patient's functional outcome.   REHAB POTENTIAL: Good  CLINICAL DECISION MAKING: Stable/uncomplicated  EVALUATION COMPLEXITY: Low   GOALS:   SHORT TERM GOALS: Target date: 06/21/2024  Pt will be independent and compliant with HEP for improved pain, ROM, strength, and function.   Baseline: Goal status: INITIAL  2.  Pt will demo at least a 10 deg increase in L shoulder flexion AROM and demo at least 50 deg in ER AROM for improved shoulder mobility and stiffness.  Baseline:  Goal status: INITIAL  3.  Pt will report worst pain to be no > 3/10 for improved pain with unexpected movements. Baseline:  Goal status: INITIAL  4.  Pt will be able to perform hair care without difficulty and without increased pain.  Baseline:  Goal status: INITIAL Target date:  06/28/24   LONG TERM GOALS: Target date: 07/11/2024  Pt will report no pain with swimming. Baseline:  Goal status: INITIAL  2.  Pt will be able to don/doff shirt without increased pain and difficulty.   Baseline:  Goal status: INITIAL  3.  Pt will report she is able to perform her normal overhead activities without significant pain.  Baseline:  Goal status: INITIAL  4.  Pt will demo improved Lshoulder strength to 5/5 MMT in flexion, scaption, ER, and IR for improved tolerance with functional carrying and lifting.  Baseline:  Goal status: INITIAL    PLAN:  PT FREQUENCY: 2x/week  PT DURATION: 6 weeks  PLANNED INTERVENTIONS: Therapeutic exercises, Therapeutic activity, Neuromuscular re-education,  Patient/Family education, Self Care, Joint mobilization, Aquatic Therapy, Dry Needling, Electrical stimulation, Spinal mobilization, Cryotherapy, Moist heat, Taping, Ultrasound, Manual therapy, and Re-evaluation   PLAN FOR NEXT SESSION: review and perform HEP.  Cont with manual therapy, shoulder ROM, and scapular/RTC strengthening and stabilization.  Asberry Rodes, PTA  06/21/24 11:17 AM

## 2024-06-27 ENCOUNTER — Ambulatory Visit (HOSPITAL_BASED_OUTPATIENT_CLINIC_OR_DEPARTMENT_OTHER): Admitting: Physical Therapy

## 2024-06-27 ENCOUNTER — Ambulatory Visit: Admitting: "Endocrinology

## 2024-06-27 ENCOUNTER — Encounter (HOSPITAL_BASED_OUTPATIENT_CLINIC_OR_DEPARTMENT_OTHER): Payer: Self-pay | Admitting: Physical Therapy

## 2024-06-27 ENCOUNTER — Encounter: Payer: Self-pay | Admitting: "Endocrinology

## 2024-06-27 VITALS — BP 120/60 | HR 82 | Ht 65.0 in | Wt 142.0 lb

## 2024-06-27 DIAGNOSIS — Z7984 Long term (current) use of oral hypoglycemic drugs: Secondary | ICD-10-CM

## 2024-06-27 DIAGNOSIS — E0789 Other specified disorders of thyroid: Secondary | ICD-10-CM | POA: Diagnosis not present

## 2024-06-27 DIAGNOSIS — E1165 Type 2 diabetes mellitus with hyperglycemia: Secondary | ICD-10-CM | POA: Diagnosis not present

## 2024-06-27 DIAGNOSIS — E782 Mixed hyperlipidemia: Secondary | ICD-10-CM | POA: Diagnosis not present

## 2024-06-27 DIAGNOSIS — M6281 Muscle weakness (generalized): Secondary | ICD-10-CM

## 2024-06-27 DIAGNOSIS — E890 Postprocedural hypothyroidism: Secondary | ICD-10-CM | POA: Diagnosis not present

## 2024-06-27 DIAGNOSIS — M25612 Stiffness of left shoulder, not elsewhere classified: Secondary | ICD-10-CM

## 2024-06-27 DIAGNOSIS — M25512 Pain in left shoulder: Secondary | ICD-10-CM

## 2024-06-27 LAB — POCT GLYCOSYLATED HEMOGLOBIN (HGB A1C): Hemoglobin A1C: 6.8 % — AB (ref 4.0–5.6)

## 2024-06-27 NOTE — Therapy (Addendum)
 OUTPATIENT PHYSICAL THERAPY SHOULDER TREATMENT   Patient Name: Bianca Phillips MRN: [id number] DOB:[birth year], 68 y.o., female Today's Date: 06/28/2024  END OF SESSION:  PT End of Session - 06/27/24 1253     Visit Number 5    Number of Visits 12    Date for Recertification  07/12/24    Authorization Type HTA    PT Start Time 1155    PT Stop Time 1240    PT Time Calculation (min) 45 min    Activity Tolerance Patient tolerated treatment well    Behavior During Therapy [facility] for tasks assessed/performed             Past Medical History:  Diagnosis Date   Acute non-recurrent frontal sinusitis 07/14/2022   Adhesive capsulitis of right shoulder 02/05/2021   Allergy    Complication of anesthesia    Constipation    Esophagitis, [place] grade C 06/01/2023   GAD (generalized anxiety disorder)    GERD (gastroesophageal reflux disease)    Heart murmur    Heart palpitations    evaluated by cardiologist--- dr h. hobart, note in epic 04-03-2021, normal echo and monitor showed NSR w/ SVT   History of COVID-19    per pt had covid twice in 12/ 2020 with mild symptoms with residual intermittant loss of taste;  and 04/ 2022 with mild symptoms that resolved   History of hyperthyroidism    age 46 s/p RAI   History of kidney stones    History of recurrent UTIs    History of repair of hiatal hernia 02/2020   Hyperlipidemia    Hypothyroidism, postablative    age 14  s/p RAI for hyperthyroidism;  followed by pcp   IDA (iron deficiency anemia)    Itching in the vaginal area 02/05/2021   Kidney stone 12/16/2014   MDD (major depressive disorder)    OA (osteoarthritis)    Pain in left shin 01/19/2023   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   Right ureteral calculus    Seasonal allergic rhinitis    Type 2 diabetes mellitus (HCC)    followed by pcp    (07-14-2021  per pt checks blood sugar at home 2-3 times weekly,  fasting sugar --120-130)   Urgency of urination    Wears hearing  aid in both ears    Past Surgical History:  Procedure Laterality Date   CESAREAN SECTION     x2  last one 1986   CYSTOSCOPY/URETEROSCOPY/HOLMIUM LASER/STENT PLACEMENT  2012   approx   CYSTOSCOPY/URETEROSCOPY/HOLMIUM LASER/STENT PLACEMENT Right 07/20/2021   Procedure: CYSTOSCOPY/RETROGRADE/URETEROSCOPY/HOLMIUM LASER/STENT PLACEMENT;  Surgeon: Selma Donnice SAUNDERS, MD;  Location: [facility];  Service: Urology;  Laterality: Right;  ONLY NEEDS 45 MIN   LAPAROSCOPIC NISSEN FUNDOPLICATION  02/2020   TOTAL ABDOMINAL HYSTERECTOMY W/ BILATERAL SALPINGOOPHORECTOMY  2002   TOTAL HIP ARTHROPLASTY Right 08/30/2021   Procedure: TOTAL HIP ARTHROPLASTY ANTERIOR APPROACH;  Surgeon: Vernetta Lonni GRADE, MD;  Location: [facility] OR;  Service: Orthopedics;  Laterality: Right;   UPPER GASTROINTESTINAL ENDOSCOPY     Patient Active Problem List   Diagnosis Date Noted   Elevated blood pressure reading in office without diagnosis of hypertension 05/30/2024   Left shoulder pain 05/30/2024   Trigger finger, right middle finger 06/24/2023   Gastric ulcer without hemorrhage or perforation 06/01/2023   Chronic constipation 01/19/2023   Closed right hip fracture (HCC) 08/29/2021   Insomnia 06/24/2021   Candida lusitanea infection 03/16/2021   Chronic kidney disease (CKD) stage G3b/A2, moderately  decreased glomerular filtration rate (GFR) between 30-44 mL/min/1.73 square meter and albuminuria creatinine ratio between 30-299 mg/g (HCC) 03/16/2021   Acute midline low back pain without sciatica 02/24/2021   Tachycardia 02/05/2021   Generalized anxiety disorder 02/05/2021   Heart palpitations 02/05/2021   Urinary frequency 02/05/2021   Type 2 diabetes mellitus with hyperglycemia, without long-term current use of insulin  (HCC) 02/05/2021   Postoperative hypothyroidism 02/05/2021   Hyperlipidemia associated with type 2 diabetes mellitus (HCC) 02/05/2021   Intractable migraine without aura and without status  migrainosus 02/05/2021   Seasonal allergic rhinitis due to pollen 02/05/2021   Gastroesophageal reflux disease 02/27/2020   Lump of right breast 11/28/2019    PCP: Oris Camie BRAVO, NP  REFERRING PROVIDER: Vernetta Lonni GRADE, MD   REFERRING DIAG: [phone] (ICD-10-CM) - Acute pain of left shoulder M75.42 (ICD-10-CM) - Impingement syndrome of left shoulder  THERAPY DIAG:  Left shoulder pain, unspecified chronicity  Stiffness of left shoulder, not elsewhere classified  Muscle weakness (generalized)  Rationale for Evaluation and Treatment: Rehabilitation  ONSET DATE: July 2025  SUBJECTIVE:                                                                                                                                                                                      SUBJECTIVE STATEMENT:  Pt denies any adverse effects after prior Rx.  Pt reports compliance with HEP.  Pt denies pain currently.      Hand dominance: Right  PERTINENT HISTORY: R shoulder adhesive capsulitis in 2024 DM type 2 R THA in 2022 depression Anemia  PAIN:  NPRS:  0/10 at rest, 1-2/10 with movement, 5/10 worst. Worst pain with unexpected movements Location:  L shoulder and proximal UE  PRECAUTIONS: Other: R THA   WEIGHT BEARING RESTRICTIONS: No  FALLS:  Has patient fallen in last 6 months? No  LIVING ENVIRONMENT: Lives with: lives alone   OCCUPATION: Retired  PLOF: Independent  PATIENT GOALS:to perform her normal act's including swimming without pain   OBJECTIVE:  Note: Objective measures were completed at Evaluation unless otherwise noted.  DIAGNOSTIC FINDINGS:  X rays on 04/23/24 FINDINGS: There is no evidence of fracture or dislocation. Glenohumeral degenerative change with spurring from the humeral head and mild joint space narrowing. There is minimal acromioclavicular degenerative spurring. No evidence of erosion or focal bone abnormality. Soft tissues are unremarkable.    IMPRESSION: Mild glenohumeral and acromioclavicular degenerative change.  PATIENT SURVEYS:  UEFI:  68/80  COGNITION: Overall cognitive status: Within functional limits for tasks assessed      UPPER EXTREMITY ROM:   AROM Right Eval  Left eval  Shoulder flexion 136 138 with pain  PROM:  160  Shoulder extension 134 146  Shoulder abduction 96 135  Shoulder adduction    Shoulder internal rotation  65  Shoulder external rotation  44/49 AROM/PROM  Elbow flexion    Elbow extension    Wrist flexion    Wrist extension    Wrist ulnar deviation    Wrist radial deviation    Wrist pronation    Wrist supination    (Blank rows = not tested)  UPPER EXTREMITY MMT:  MMT Right eval Left eval  Shoulder flexion    Shoulder extension    Shoulder abduction    Shoulder adduction    Shoulder internal rotation  4-/5  Shoulder external rotation  4/5  Middle trapezius    Lower trapezius    Elbow flexion    Elbow extension    Wrist flexion    Wrist extension    Wrist ulnar deviation    Wrist radial deviation    Wrist pronation    Wrist supination    Grip strength (lbs)    (Blank rows = not tested)  SHOULDER SPECIAL TESTS: Impingement tests: Neer's and Hawkin's [place]:  negative on R, positive on L   PALPATION:  No tenderness with palpation t/o L shoulder                                                                                                                             TREATMENT:   06/27/24 Pulleys in flexion and scaption x 20 each Standing rows with GTB 2x15 Theraband shoulder extension GTB 2x15 Supine serratus punch 2# 2x15 Supine shoulder ABC  1# x 1 reps Supine rhythmic stab's at 60/90/120 deg flexion 2x30 sec each 4D ball rolls with 2.2# ball Standing ER with RTB 2 sets of approx 12 S/L ER 2x10  Pt received L shoulder PROM in flexion and ER in supine per pt and tissue tolerance.    06/21/24 Pulleys 2' ea flexion and abduction Seated punches 2#  2x10 Seated active shoulder flexion 1# 2x10 Seated biceps curls 3# 3x10 Theraband row GTB 2x15 Theraband extension GTB 2x15 Bent over row 3# 2x10 Bent over extension 3# 2x10 Ball rolls at wall 4way 2.2# ball x15ea Seated bil ER RTB 2x10 Stnding triceps extension GTB 2x15 Supine SA punch 2# 2x15 Supine active flexion 1# 3x10OH press 2x10 (trialed with 2#, but painful so removed weight) PROM L shoulder    06/15/24  Pulleys 2'ea flexion/abd Seated punches 2# 2x10 Seated active shoulder flexion 1# 2x10 Seated biceps curls 3# 2x10 Theraband row GTB 2x15 Theraband extension RTB 2x10 Bent over row 3# 2x10 Bent over extension 3# 2x10 Doorway pec stretching (modified) Seated bil ER RTB 2x10 Stnding triceps extension GTB 2x10 Supine SA punch 2# 2x15 Supine active flexion 1# 2x10 (trialed 2# but had discomfort) PROM L shoulder UBE L1 each way, then 30sec each way   06/06/24  Pulleys 1'ea flexion/abd Seated punches 2# 2x10 Seated active shoulder flexion  1# 2x10 Seated biceps curls 2# 2x10 Theraband row GTB 2x15 Theraband extension RTB 2x15 Bent over row 3# 2x10 Bent over extension 3# 2x10 Doorway pec stretching Seated bil ER RTB 2x20 Stnding triceps extension GTB 2x10 Supine SA punch 2# 2x10 Supine wand flexion 2# 2x10 PROM L shoulder    PATIENT EDUCATION: Education details: dx, relevant anatomy, POC, rationale of interventions, and HEP.  Person educated: Patient Education method: Explanation, Demonstration, Tactile cues, Verbal cues, and Handouts Education comprehension: verbalized understanding, returned demonstration, verbal cues required, tactile cues required, and needs further education  HOME EXERCISE PROGRAM: Access Code: [email or web address] URL: [email or web address] Date: 06/01/2024 Prepared by: Mose Minerva  Exercises - Supine Single Arm Shoulder Protraction  - 1 x daily - 7 x weekly - 2 sets - 10 reps - Supine Shoulder Alphabet  - 1 x daily - 7  x weekly - 1 reps - Prone Shoulder Extension - Single Arm  - 1 x daily - 6-7 x weekly - 2 sets - 10 reps  ASSESSMENT:  CLINICAL IMPRESSION:  Pt performed exercises well with cuing and instruction in correct form and positioning.  Pt was fatigued with weighted ball rolls on wall.  PT performed PROM and she had some pain in end range shoulder flexion and is limited in ER.  She responded well to treatment having no c/o's and no pain after treatment.  Pt should benefit from skilled PT to address impairments and goals and improve overall function.   OBJECTIVE IMPAIRMENTS: decreased ROM, decreased strength, hypomobility, impaired flexibility, impaired UE functional use, and pain.   ACTIVITY LIMITATIONS: carrying, lifting, dressing, reach over head, and hygiene/grooming  PARTICIPATION LIMITATIONS: shopping and swimming  PERSONAL FACTORS: 1-2 comorbidities: DM type 2, R shoulder adhesive capsulitis are also affecting patient's functional outcome.   REHAB POTENTIAL: Good  CLINICAL DECISION MAKING: Stable/uncomplicated  EVALUATION COMPLEXITY: Low   GOALS:   SHORT TERM GOALS: Target date: 06/21/2024   Pt will be independent and compliant with HEP for improved pain, ROM, strength, and function.   Baseline: Goal status: INITIAL  2.  Pt will demo at least a 10 deg increase in L shoulder flexion AROM and demo at least 50 deg in ER AROM for improved shoulder mobility and stiffness.  Baseline:  Goal status: INITIAL  3.  Pt will report worst pain to be no > 3/10 for improved pain with unexpected movements. Baseline:  Goal status: INITIAL  4.  Pt will be able to perform hair care without difficulty and without increased pain.  Baseline:  Goal status: INITIAL Target date:  06/28/24   LONG TERM GOALS: Target date: 07/11/2024  Pt will report no pain with swimming. Baseline:  Goal status: INITIAL  2.  Pt will be able to don/doff shirt without increased pain and difficulty.   Baseline:   Goal status: INITIAL  3.  Pt will report she is able to perform her normal overhead activities without significant pain.  Baseline:  Goal status: INITIAL  4.  Pt will demo improved Lshoulder strength to 5/5 MMT in flexion, scaption, ER, and IR for improved tolerance with functional carrying and lifting.  Baseline:  Goal status: INITIAL    PLAN:  PT FREQUENCY: 2x/week  PT DURATION: 6 weeks  PLANNED INTERVENTIONS: Therapeutic exercises, Therapeutic activity, Neuromuscular re-education, Patient/Family education, Self Care, Joint mobilization, Aquatic Therapy, Dry Needling, Electrical stimulation, Spinal mobilization, Cryotherapy, Moist heat, Taping, Ultrasound, Manual therapy, and Re-evaluation   PLAN FOR NEXT SESSION: review and perform HEP.  Cont with  manual therapy, shoulder ROM, and scapular/RTC strengthening and stabilization.  Leigh Minerva III PT, DPT 06/28/24 9:43 PM

## 2024-06-27 NOTE — Progress Notes (Signed)
 Outpatient Endocrinology Note Obadiah Birmingham, MD  06/27/24   Bianca Phillips 1956/03/08 [id number]  Referring Provider: Oris Camie BRAVO, NP Primary Care Provider: Oris Camie BRAVO, NP Subjective  No chief complaint on file.   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 History of hyperthyroidism (unknown etiology) s/p RAI ablation in young age in 76s due to palpitations  Patient is currently biochemically normal TSH with persistently high FT4, FT3 and high heart rate.  Educated on thyroid  axis.  Recommend the following: continue levothyroxine  to 100 mcg every morning.  Advised to take levothyroxine  first thing in the morning on empty stomach and wait at least 30 minutes to 1 hour before eating or drinking anything or taking any other medications. Space out levothyroxine  by 4 hours from any acid reflux medication/fibrate/iron/calcium /multivitamin. Advised to take birth control pills and nutritional supplements in the evening. Repeat lab before next visit or sooner if symptoms of hyperthyroidism or hypothyroidism develop.  Notify us  immediately in case of significant weight gain or loss. Counseled on compliance and follow up needs.  Suspected resistance to thyroid  hormone given high to normal TSH, high free T4, high free T3 Given course to the patient to check with her insurance if genetic testing for thyroid  hormone resistance will be covered, can be done under [facility] Need to rule out other differential diagnoses, need to figure out if  insurance would cover those tests: alpha subunit of TSH to rule out pituitary adenoma and T4-binding panel, which measures T4 binding to serum albumin , TBG, and transthyretin to r/o Thyroid  hormone binding abnormalities. Patient reports insurance said we end tot alk to her insurance; insurance would not talk to [facility] lab billing; pending a combined call with patient and [facility] billing to understand cost   C/o diabetes not well controlled, BG can go up to 170s On metformin  1000 mg bid, atorvastatin  40 mg every day (2 pills three days a week), no ACE/ARB Couldn't tolerate farxiga  due to dehydration Watch for GFR (last 49),  sees a nephrologist, deferred to him     I have reviewed current medications, nurse's notes, allergies, vital signs, past medical and surgical history, family medical history, and social history for this encounter. Counseled patient on symptoms, examination findings, lab findings, imaging results, treatment decisions and monitoring and prognosis. The patient understood the recommendations and agrees with the treatment plan. All questions regarding treatment plan were fully answered.   Return in about 6 months (around 12/25/2024) for labs today, visit + labs before next visit.   Obadiah Birmingham, MD  06/27/24   I have reviewed current medications, nurse's notes, allergies, vital signs, past medical and surgical history, family medical history, and social history for this encounter. Counseled patient on symptoms, examination findings, lab findings, imaging results, treatment decisions and monitoring and prognosis. The patient understood the recommendations and agrees with the treatment plan. All questions regarding treatment plan were fully answered.   History of Present Illness Bianca Phillips is a 68 y.o. year old female who presents to our clinic with abnormal  thyroid  diagnosed diagnosed around age 87 as well as diabetes.  Patient reports her only symptom that led to her  radioactive iodine ablation was her palpitation, which still happens and she has been checked with her cardiologist She denies any other thyroid  symptoms Taking levothyroxine  appropriately  On metformin  1000 mg bid- well-tolerated No other active complaints at this point Checks intermittently: BG 118-136  Symptoms suggestive of HYPOTHYROIDISM:  fatigue Yes weight gain No cold intolerance  No constipation  Yes, on fiber   Symptoms suggestive of HYPERTHYROIDISM:  weight loss  No heat intolerance No hyperdefecation  No palpitations  Yes  Compressive symptoms:  dysphagia  No dysphonia  No positional dyspnea (especially with simultaneous arms elevation)  No  Smokes  No On biotin  No Personal history of head/neck surgery/irradiation  Yes    Physical Exam  BP 120/60   Pulse 82   Ht 5' 5 (1.651 m)   Wt 142 lb (64.4 kg)   SpO2 96%   BMI 23.63 kg/m  Constitutional: well developed, well nourished Head: normocephalic, atraumatic, no exophthalmos Eyes: sclera anicteric, no redness Neck: no thyromegaly, no thyroid  tenderness; no nodules palpated Lungs: normal respiratory effort Neurology: alert and oriented, no fine hand tremor Skin: dry, no appreciable rashes Musculoskeletal: no appreciable defects Psychiatric: normal mood and affect  Allergies Allergies  Allergen Reactions   Curare [Tubocurarine] Other (See Comments)    Per pt was awake and aware that she had paralysis and could not breath    Current Medications Patient's Medications  New Prescriptions   No medications on file  Previous Medications   ALPHA-LIPOIC ACID 600 MG TABS    Take 1 tablet by mouth every evening.   ALPRAZOLAM  (XANAX ) 0.25 MG TABLET    TAKE 1 TABLET BY MOUTH 2 TIMES DAILY AS NEEDED FOR ANXIETY.   ASCORBIC ACID (VITAMIN C) 1000 MG TABLET    Take 1,000 mg by mouth daily.   ATORVASTATIN  (LIPITOR) 40 MG TABLET    TAKE 2TABS BY MOUTH X3DAYS A WEEK AND 1 TABLET 4 DAYS A WEEK   AZELASTINE  HCL 137  MCG/SPRAY SOLN    PLACE 2 SPRAYS INTO BOTH NOSTRILS 2 (TWO) TIMES DAILY   BLOOD GLUCOSE MONITORING SUPPL DEVI    1 each by Does not apply route in the morning, at noon, and at bedtime. May substitute to any manufacturer covered by patient's insurance.   CALCIUM  CARBONATE-VITAMIN D  (CALCIUM -VITAMIN D  PO)    Take 1 tablet by mouth daily.   CETIRIZINE (ZYRTEC) 10 MG TABLET    Take 10 mg by mouth daily.   CHOLECALCIFEROL  (VITAMIN D3) 25 MCG (1000 UNIT) TABLET    Take 1,000 Units by mouth daily.   HYDROCODONE -ACETAMINOPHEN  (NORCO/VICODIN) 5-325 MG TABLET    Take 1 tablet by mouth every 6 (six) hours as needed for moderate pain (pain score 4-6).   LANCETS (ONETOUCH DELICA PLUS LANCET30G) MISC    1 Device by Other route daily.   LEVOTHYROXINE  (SYNTHROID ) 100 MCG TABLET    TAKE 1 TABLET BY MOUTH EVERY DAY   MAGNESIUM 250 MG TABS    Take 1 tablet by mouth daily.   METFORMIN  (GLUCOPHAGE ) 1000 MG TABLET    Take 1 tablet (1,000 mg total) by mouth 2 (two) times daily with a meal.   MISC NATURAL PRODUCTS (GLUCOSAMINE CHOND CMP ADVANCED PO)    Take 2 capsules by mouth every evening.   NORTRIPTYLINE  (PAMELOR ) 50 MG CAPSULE    TAKE 1 CAPSULE (50 MG) BY MOUTH  AT BEDTIME.   OMEGA-3 FATTY ACIDS (OMEGA-3 FISH OIL PO)    Take 2,000 capsules by mouth every evening.   ONETOUCH ULTRA TEST STRIP       PANTOPRAZOLE  (PROTONIX ) 40 MG TABLET    Take 1 tablet (40 mg total) by mouth daily.   PROBIOTIC PRODUCT (PROBIOTIC DAILY) CAPS    Take 1 capsule by mouth every evening.   PROPRANOLOL  ER (INDERAL  LA) 60 MG 24 HR CAPSULE    Take 1 capsule (60 mg total) by mouth at bedtime.   SERTRALINE  (ZOLOFT ) 50 MG TABLET    TAKE 1 TABLET BY MOUTH EVERY DAY   TRIMETHOPRIM  (TRIMPEX ) 100 MG TABLET    TAKE 1 TABLET BY MOUTH EVERYDAY AT BEDTIME   TURMERIC 500 MG CAPS    Take 1 capsule by mouth every evening.  Modified Medications   No medications on file  Discontinued Medications   No medications on file    Past Medical History Past  Medical History:  Diagnosis Date   Acute non-recurrent frontal sinusitis 07/14/2022   Adhesive capsulitis of right shoulder 02/05/2021   Allergy    Complication of anesthesia    Constipation    Esophagitis, [place] grade C 06/01/2023   GAD (generalized anxiety disorder)    GERD (gastroesophageal reflux disease)    Heart murmur    Heart palpitations    evaluated by cardiologist--- dr h. hobart, note in epic 04-03-2021, normal echo and monitor showed NSR w/ SVT   History of COVID-19    per pt had covid twice in 12/ 2020 with mild symptoms with residual intermittant loss of taste;  and 04/ 2022 with mild symptoms that resolved   History of hyperthyroidism    age 42 s/p RAI   History of kidney stones    History of recurrent UTIs    History of repair of hiatal hernia 02/2020   Hyperlipidemia    Hypothyroidism, postablative    age 58  s/p RAI for hyperthyroidism;  followed by pcp   IDA (iron deficiency anemia)    Itching in the vaginal area 02/05/2021   Kidney stone 12/16/2014   MDD (major depressive disorder)    OA (osteoarthritis)    Pain in left shin 01/19/2023   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   Right ureteral calculus    Seasonal allergic rhinitis    Type 2 diabetes mellitus (HCC)    followed by pcp    (07-14-2021  per pt checks blood sugar at home 2-3 times weekly,  fasting sugar --120-130)   Urgency of urination    Wears hearing aid in both ears     Past Surgical History Past Surgical History:  Procedure Laterality Date   CESAREAN SECTION     x2  last one 1986   CYSTOSCOPY/URETEROSCOPY/HOLMIUM LASER/STENT PLACEMENT  2012   approx   CYSTOSCOPY/URETEROSCOPY/HOLMIUM LASER/STENT PLACEMENT Right 07/20/2021   Procedure: CYSTOSCOPY/RETROGRADE/URETEROSCOPY/HOLMIUM LASER/STENT PLACEMENT;  Surgeon: Selma Donnice SAUNDERS, MD;  Location: [facility];  Service: Urology;  Laterality: Right;  ONLY NEEDS 45 MIN   LAPAROSCOPIC NISSEN FUNDOPLICATION  02/2020   TOTAL  ABDOMINAL HYSTERECTOMY W/ BILATERAL SALPINGOOPHORECTOMY  2002   TOTAL HIP ARTHROPLASTY Right 08/30/2021   Procedure: TOTAL HIP ARTHROPLASTY ANTERIOR APPROACH;  Surgeon: Vernetta Lonni GRADE, MD;  Location: [facility] OR;  Service: Orthopedics;  Laterality: Right;   UPPER GASTROINTESTINAL ENDOSCOPY      Family History family history includes Diabetes in her father and mother.  Social History Social History   Socioeconomic History  Marital status: Divorced    Spouse name: Not on file   Number of children: 3   Years of education: 16   Highest education level: Bachelor's degree (e.g., BA, AB, BS)  Occupational History   Occupation: retired    Comment: Runner, broadcasting/film/video  Tobacco Use   Smoking status: Former    Current packs/day: 0.00    Average packs/day: 0.5 packs/day for 10.0 years (5.0 ttl pk-yrs)    Types: Cigarettes    Start date: 71    Quit date: 1988    Years since quitting: 37.7   Smokeless tobacco: Never   Tobacco comments:    35 years ago  Vaping Use   Vaping status: Never Used  Substance and Sexual Activity   Alcohol use: Never   Drug use: Never   Sexual activity: Not Currently    Birth control/protection: Post-menopausal, Surgical  Other Topics Concern   Not on file  Social History Narrative   Not on file   Social Drivers of Health   Financial Resource Strain: Low Risk  (11/01/2023)   Overall Financial Resource Strain (CARDIA)    Difficulty of Paying Living Expenses: Not hard at all  Food Insecurity: No Food Insecurity (11/01/2023)   Hunger Vital Sign    Worried About Running Out of Food in the Last Year: Never true    Ran Out of Food in the Last Year: Never true  Transportation Needs: No Transportation Needs (11/01/2023)   PRAPARE - Administrator, Civil Service (Medical): No    Lack of Transportation (Non-Medical): No  Physical Activity: Sufficiently Active (11/01/2023)   Exercise Vital Sign    Days of Exercise per Week: 4 days    Minutes of Exercise  per Session: 60 min  Stress: No Stress Concern Present (11/01/2023)   [employer] of Occupational Health - Occupational Stress Questionnaire    Feeling of Stress : Not at all  Social Connections: Moderately Integrated (11/01/2023)   Social Connection and Isolation Panel    Frequency of Communication with Friends and Family: More than three times a week    Frequency of Social Gatherings with Friends and Family: More than three times a week    Attends Religious Services: More than 4 times per year    Active Member of Clubs or Organizations: Yes    Attends Banker Meetings: More than 4 times per year    Marital Status: Divorced  Intimate Partner Violence: Not At Risk (11/01/2023)   Humiliation, Afraid, Rape, and Kick questionnaire    Fear of Current or Ex-Partner: No    Emotionally Abused: No    Physically Abused: No    Sexually Abused: No    Laboratory Investigations Lab Results  Component Value Date   TSH 5.50 (H) 06/20/2024   TSH 5.05 (H) 03/20/2024   TSH 3.97 09/15/2023   FREET4 2.7 (H) 06/20/2024   FREET4 3.0 (H) 03/20/2024   FREET4 3.3 (H) 01/24/2024     Lab Results  Component Value Date   TSI <89 09/20/2023     No components found for: TRAB   Lab Results  Component Value Date   CHOL 174 01/24/2024   Lab Results  Component Value Date   HDL 47 (L) 01/24/2024   Lab Results  Component Value Date   LDLCALC 93 01/24/2024   Lab Results  Component Value Date   TRIG 245 (H) 01/24/2024   Lab Results  Component Value Date   CHOLHDL 3.7 01/24/2024   Lab  Results  Component Value Date   CREATININE 1.21 (H) 01/24/2024   No results found for: GFR    Component Value Date/Time   NA 139 01/24/2024 0935   NA 138 06/24/2023 1000   K 4.3 01/24/2024 0935   CL 102 01/24/2024 0935   CO2 29 01/24/2024 0935   GLUCOSE 148 (H) 01/24/2024 0935   BUN 28 (H) 01/24/2024 0935   BUN 20 06/24/2023 1000   CREATININE 1.21 (H) 01/24/2024 0935   CALCIUM  9.6  01/24/2024 0935   PROT 6.6 06/24/2023 1000   ALBUMIN  4.4 06/24/2023 1000   AST 21 06/24/2023 1000   ALT 22 06/24/2023 1000   ALKPHOS 113 06/24/2023 1000   BILITOT 0.4 06/24/2023 1000   GFRNONAA 54 (L) 03/12/2022 0415      Latest Ref Rng & Units 01/24/2024    9:35 AM 06/24/2023   10:00 AM 10/27/2022   11:11 AM  BMP  Glucose 65 - 99 mg/dL 851  844  837   BUN 7 - 25 mg/dL 28  20  26    Creatinine 0.50 - 1.05 mg/dL 8.78  8.87  8.76   BUN/Creat Ratio 6 - 22 (calc) 23  18  21    Sodium 135 - 146 mmol/L 139  138  143   Potassium 3.5 - 5.3 mmol/L 4.3  4.5  4.9   Chloride 98 - 110 mmol/L 102  101  103   CO2 20 - 32 mmol/L 29  23  25    Calcium  8.6 - 10.4 mg/dL 9.6  9.4  89.8        Component Value Date/Time   WBC 5.0 06/24/2023 1000   WBC 7.8 03/12/2022 0415   RBC 4.58 06/24/2023 1000   RBC 4.80 03/12/2022 0415   HGB 13.5 06/24/2023 1000   HCT 42.8 06/24/2023 1000   PLT 247 06/24/2023 1000   MCV 93 06/24/2023 1000   MCH 29.5 06/24/2023 1000   MCH 29.0 03/12/2022 0415   MCHC 31.5 06/24/2023 1000   MCHC 32.6 03/12/2022 0415   RDW 12.5 06/24/2023 1000   LYMPHSABS 2.1 06/24/2023 1000   MONOABS 0.5 08/29/2021 1213   EOSABS 0.2 06/24/2023 1000   BASOSABS 0.0 06/24/2023 1000      Parts of this note may have been dictated using voice recognition software. There may be variances in spelling and vocabulary which are unintentional. Not all errors are proofread. Please notify the dino if any discrepancies are noted or if the meaning of any statement is not clear.

## 2024-06-28 LAB — MICROALBUMIN / CREATININE URINE RATIO
Creatinine, Urine: 81 mg/dL (ref 20–275)
Microalb Creat Ratio: 27 mg/g{creat} (ref <30)
Microalb, Ur: 2.2 mg/dL

## 2024-07-04 ENCOUNTER — Encounter (HOSPITAL_BASED_OUTPATIENT_CLINIC_OR_DEPARTMENT_OTHER): Payer: Self-pay | Admitting: Physical Therapy

## 2024-07-04 ENCOUNTER — Ambulatory Visit (HOSPITAL_BASED_OUTPATIENT_CLINIC_OR_DEPARTMENT_OTHER): Attending: Orthopaedic Surgery | Admitting: Physical Therapy

## 2024-07-04 DIAGNOSIS — M25512 Pain in left shoulder: Secondary | ICD-10-CM | POA: Diagnosis not present

## 2024-07-04 DIAGNOSIS — M25612 Stiffness of left shoulder, not elsewhere classified: Secondary | ICD-10-CM | POA: Diagnosis not present

## 2024-07-04 DIAGNOSIS — M6281 Muscle weakness (generalized): Secondary | ICD-10-CM | POA: Insufficient documentation

## 2024-07-04 NOTE — Therapy (Signed)
 OUTPATIENT PHYSICAL THERAPY SHOULDER TREATMENT   Patient Name: Bianca Phillips MRN: [id number] DOB:16-Nov-1955, 68 y.o., female Today's Date: 07/05/2024  END OF SESSION:  PT End of Session - 07/04/24 1231     Visit Number 6    Number of Visits 12    Date for Recertification  07/12/24    Authorization Type HTA    PT Start Time 1205    PT Stop Time 1247    PT Time Calculation (min) 42 min    Activity Tolerance Patient tolerated treatment well    Behavior During Therapy [facility] for tasks assessed/performed              Past Medical History:  Diagnosis Date   Acute non-recurrent frontal sinusitis 07/14/2022   Adhesive capsulitis of right shoulder 02/05/2021   Allergy    Complication of anesthesia    Constipation    Esophagitis, [place] grade C 06/01/2023   GAD (generalized anxiety disorder)    GERD (gastroesophageal reflux disease)    Heart murmur    Heart palpitations    evaluated by cardiologist--- dr h. hobart, note in epic 04-03-2021, normal echo and monitor showed NSR w/ SVT   History of COVID-19    per pt had covid twice in 12/ 2020 with mild symptoms with residual intermittant loss of taste;  and 04/ 2022 with mild symptoms that resolved   History of hyperthyroidism    age 47 s/p RAI   History of kidney stones    History of recurrent UTIs    History of repair of hiatal hernia 02/2020   Hyperlipidemia    Hypothyroidism, postablative    age 58  s/p RAI for hyperthyroidism;  followed by pcp   IDA (iron deficiency anemia)    Itching in the vaginal area 02/05/2021   Kidney stone 12/16/2014   MDD (major depressive disorder)    OA (osteoarthritis)    Pain in left shin 01/19/2023   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   Right ureteral calculus    Seasonal allergic rhinitis    Type 2 diabetes mellitus (HCC)    followed by pcp    (07-14-2021  per pt checks blood sugar at home 2-3 times weekly,  fasting sugar --120-130)   Urgency of urination    Wears hearing  aid in both ears    Past Surgical History:  Procedure Laterality Date   CESAREAN SECTION     x2  last one 1986   CYSTOSCOPY/URETEROSCOPY/HOLMIUM LASER/STENT PLACEMENT  2012   approx   CYSTOSCOPY/URETEROSCOPY/HOLMIUM LASER/STENT PLACEMENT Right 07/20/2021   Procedure: CYSTOSCOPY/RETROGRADE/URETEROSCOPY/HOLMIUM LASER/STENT PLACEMENT;  Surgeon: Selma Donnice SAUNDERS, MD;  Location: [facility];  Service: Urology;  Laterality: Right;  ONLY NEEDS 45 MIN   LAPAROSCOPIC NISSEN FUNDOPLICATION  02/2020   TOTAL ABDOMINAL HYSTERECTOMY W/ BILATERAL SALPINGOOPHORECTOMY  2002   TOTAL HIP ARTHROPLASTY Right 08/30/2021   Procedure: TOTAL HIP ARTHROPLASTY ANTERIOR APPROACH;  Surgeon: Vernetta Lonni GRADE, MD;  Location: [facility] OR;  Service: Orthopedics;  Laterality: Right;   UPPER GASTROINTESTINAL ENDOSCOPY     Patient Active Problem List   Diagnosis Date Noted   Elevated blood pressure reading in office without diagnosis of hypertension 05/30/2024   Left shoulder pain 05/30/2024   Trigger finger, right middle finger 06/24/2023   Gastric ulcer without hemorrhage or perforation 06/01/2023   Chronic constipation 01/19/2023   Closed right hip fracture (HCC) 08/29/2021   Insomnia 06/24/2021   Candida lusitanea infection 03/16/2021   Chronic kidney disease (CKD) stage G3b/A2,  moderately decreased glomerular filtration rate (GFR) between 30-44 mL/min/1.73 square meter and albuminuria creatinine ratio between 30-299 mg/g (HCC) 03/16/2021   Acute midline low back pain without sciatica 02/24/2021   Tachycardia 02/05/2021   Generalized anxiety disorder 02/05/2021   Heart palpitations 02/05/2021   Urinary frequency 02/05/2021   Type 2 diabetes mellitus with hyperglycemia, without long-term current use of insulin  (HCC) 02/05/2021   Postoperative hypothyroidism 02/05/2021   Hyperlipidemia associated with type 2 diabetes mellitus (HCC) 02/05/2021   Intractable migraine without aura and without status  migrainosus 02/05/2021   Seasonal allergic rhinitis due to pollen 02/05/2021   Gastroesophageal reflux disease 02/27/2020   Lump of right breast 11/28/2019    PCP: Oris Camie BRAVO, NP  REFERRING PROVIDER: Vernetta Lonni GRADE, MD   REFERRING DIAG: [phone] (ICD-10-CM) - Acute pain of left shoulder M75.42 (ICD-10-CM) - Impingement syndrome of left shoulder  THERAPY DIAG:  Left shoulder pain, unspecified chronicity  Muscle weakness (generalized)  Stiffness of left shoulder, not elsewhere classified  Rationale for Evaluation and Treatment: Rehabilitation  ONSET DATE: July 2025  SUBJECTIVE:                                                                                                                                                                                      SUBJECTIVE STATEMENT:  Pt states she has been on a new medication for a few weeks for BP though it helps her HR.  Pt states it decreases her HR.  Pt denies any adverse effects after prior Rx.  Pt reports compliance with HEP.  She denies pain currently.      Hand dominance: Right  PERTINENT HISTORY: R shoulder adhesive capsulitis in 2024 DM type 2 R THA in 2022 depression Anemia  PAIN:  NPRS:  0/10 at rest, 1-2/10 with movement, 5/10 worst. Worst pain with unexpected movements Location:  L shoulder and proximal UE  PRECAUTIONS: Other: R THA   WEIGHT BEARING RESTRICTIONS: No  FALLS:  Has patient fallen in last 6 months? No  LIVING ENVIRONMENT: Lives with: lives alone   OCCUPATION: Retired  PLOF: Independent  PATIENT GOALS:to perform her normal act's including swimming without pain   OBJECTIVE:  Note: Objective measures were completed at Evaluation unless otherwise noted.  DIAGNOSTIC FINDINGS:  X rays on 04/23/24 FINDINGS: There is no evidence of fracture or dislocation. Glenohumeral degenerative change with spurring from the humeral head and mild joint space narrowing. There is minimal  acromioclavicular degenerative spurring. No evidence of erosion or focal bone abnormality. Soft tissues are unremarkable.   IMPRESSION: Mild glenohumeral and acromioclavicular degenerative change.  PATIENT SURVEYS:  UEFI:  68/80  COGNITION: Overall cognitive status:  Within functional limits for tasks assessed      UPPER EXTREMITY ROM:   AROM Right Eval  Left eval  Shoulder flexion 136 138 with pain PROM:  160  Shoulder extension 134 146  Shoulder abduction 96 135  Shoulder adduction    Shoulder internal rotation  65  Shoulder external rotation  44/49 AROM/PROM  Elbow flexion    Elbow extension    Wrist flexion    Wrist extension    Wrist ulnar deviation    Wrist radial deviation    Wrist pronation    Wrist supination    (Blank rows = not tested)  UPPER EXTREMITY MMT:  MMT Right eval Left eval  Shoulder flexion    Shoulder extension    Shoulder abduction    Shoulder adduction    Shoulder internal rotation  4-/5  Shoulder external rotation  4/5  Middle trapezius    Lower trapezius    Elbow flexion    Elbow extension    Wrist flexion    Wrist extension    Wrist ulnar deviation    Wrist radial deviation    Wrist pronation    Wrist supination    Grip strength (lbs)    (Blank rows = not tested)  SHOULDER SPECIAL TESTS: Impingement tests: Neer's and Hawkin's [place]:  negative on R, positive on L   PALPATION:  No tenderness with palpation t/o L shoulder                                                                                                                             TREATMENT:   07/04/24 UBE x 3.5 mins lvl 1  Pt received L shoulder PROM in flexion, scaption, and ER in supine per pt and tissue tolerance.   Supine rhythmic stab's at 60/90/120 deg flexion 2x30 sec each Supine serratus punch 2# x15, 3# 2x10 Supine shoulder ABC x 1 rep with 1# 4D ball rolls with 2.2# ball approx 10 reps each, 1.1# ball x 10 reps each  Standing ER/IR with arm  at side with RTB 2x10 each Standing shoulder extension with GTB 2x15 Pulleys in flexion and scaption x 20 each   06/27/24 Pulleys in flexion and scaption x 20 each Standing rows with GTB 2x15 Theraband shoulder extension GTB 2x15 Supine serratus punch 2# 2x15 Supine shoulder ABC  1# x 1 reps 4D ball rolls with 2.2# ball Standing ER with RTB 2 sets of approx 12 S/L ER 2x10  Pt received L shoulder PROM in flexion and ER in supine per pt and tissue tolerance.    06/21/24 Pulleys 2' ea flexion and abduction Seated punches 2# 2x10 Seated active shoulder flexion 1# 2x10 Seated biceps curls 3# 3x10 Theraband row GTB 2x15 Theraband extension GTB 2x15 Bent over row 3# 2x10 Bent over extension 3# 2x10 Ball rolls at wall 4way 2.2# ball x15ea Seated bil ER RTB 2x10 Stnding triceps extension GTB 2x15 Supine SA punch 2# 2x15 Supine  active flexion 1# 3x10OH press 2x10 (trialed with 2#, but painful so removed weight) PROM L shoulder    06/15/24  Pulleys 2'ea flexion/abd Seated punches 2# 2x10 Seated active shoulder flexion 1# 2x10 Seated biceps curls 3# 2x10 Theraband row GTB 2x15 Theraband extension RTB 2x10 Bent over row 3# 2x10 Bent over extension 3# 2x10 Doorway pec stretching (modified) Seated bil ER RTB 2x10 Stnding triceps extension GTB 2x10 Supine SA punch 2# 2x15 Supine active flexion 1# 2x10 (trialed 2# but had discomfort) PROM L shoulder UBE L1 each way, then 30sec each way   06/06/24  Pulleys 1'ea flexion/abd Seated punches 2# 2x10 Seated active shoulder flexion 1# 2x10 Seated biceps curls 2# 2x10 Theraband row GTB 2x15 Theraband extension RTB 2x15 Bent over row 3# 2x10 Bent over extension 3# 2x10 Doorway pec stretching Seated bil ER RTB 2x20 Stnding triceps extension GTB 2x10 Supine SA punch 2# 2x10 Supine wand flexion 2# 2x10 PROM L shoulder    PATIENT EDUCATION: Education details: dx, relevant anatomy, POC, rationale of interventions, and  HEP.  Person educated: Patient Education method: Explanation, Demonstration, Tactile cues, Verbal cues, and Handouts Education comprehension: verbalized understanding, returned demonstration, verbal cues required, tactile cues required, and needs further education  HOME EXERCISE PROGRAM: Access Code: [email or web address] URL: [email or web address] Date: 06/01/2024 Prepared by: Mose Minerva  Exercises - Supine Single Arm Shoulder Protraction  - 1 x daily - 7 x weekly - 2 sets - 10 reps - Supine Shoulder Alphabet  - 1 x daily - 7 x weekly - 1 reps - Prone Shoulder Extension - Single Arm  - 1 x daily - 6-7 x weekly - 2 sets - 10 reps  ASSESSMENT:  CLINICAL IMPRESSION:  Pt requires cuing and instruction in correct form and positioning with exercises.  She performed therapeutic exercises and neuromuscular re-ed activities well with cuing and instruction.  Pt had some pain at the end of the 1st set of ball rolls with 2.2# ball and PT decreased resistance for the 2nd set.  PT performed PROM and she has limitations in ER.  She responded well to treatment having no c/o's and no pain after treatment.  Pt should benefit from skilled PT to address impairments and goals and improve overall function.    OBJECTIVE IMPAIRMENTS: decreased ROM, decreased strength, hypomobility, impaired flexibility, impaired UE functional use, and pain.   ACTIVITY LIMITATIONS: carrying, lifting, dressing, reach over head, and hygiene/grooming  PARTICIPATION LIMITATIONS: shopping and swimming  PERSONAL FACTORS: 1-2 comorbidities: DM type 2, R shoulder adhesive capsulitis are also affecting patient's functional outcome.   REHAB POTENTIAL: Good  CLINICAL DECISION MAKING: Stable/uncomplicated  EVALUATION COMPLEXITY: Low   GOALS:   SHORT TERM GOALS: Target date: 06/21/2024   Pt will be independent and compliant with HEP for improved pain, ROM, strength, and function.   Baseline: Goal status: INITIAL  2.   Pt will demo at least a 10 deg increase in L shoulder flexion AROM and demo at least 50 deg in ER AROM for improved shoulder mobility and stiffness.  Baseline:  Goal status: INITIAL  3.  Pt will report worst pain to be no > 3/10 for improved pain with unexpected movements. Baseline:  Goal status: INITIAL  4.  Pt will be able to perform hair care without difficulty and without increased pain.  Baseline:  Goal status: INITIAL Target date:  06/28/24   LONG TERM GOALS: Target date: 07/11/2024  Pt will report no pain with swimming. Baseline:  Goal status: INITIAL  2.  Pt will be able to don/doff shirt without increased pain and difficulty.   Baseline:  Goal status: INITIAL  3.  Pt will report she is able to perform her normal overhead activities without significant pain.  Baseline:  Goal status: INITIAL  4.  Pt will demo improved Lshoulder strength to 5/5 MMT in flexion, scaption, ER, and IR for improved tolerance with functional carrying and lifting.  Baseline:  Goal status: INITIAL    PLAN:  PT FREQUENCY: 2x/week  PT DURATION: 6 weeks  PLANNED INTERVENTIONS: Therapeutic exercises, Therapeutic activity, Neuromuscular re-education, Patient/Family education, Self Care, Joint mobilization, Aquatic Therapy, Dry Needling, Electrical stimulation, Spinal mobilization, Cryotherapy, Moist heat, Taping, Ultrasound, Manual therapy, and Re-evaluation   PLAN FOR NEXT SESSION: review and perform HEP.  Cont with manual therapy, shoulder ROM, and scapular/RTC strengthening and stabilization.  Leigh Minerva III PT, DPT 07/05/24 5:06 PM

## 2024-07-11 ENCOUNTER — Encounter (HOSPITAL_BASED_OUTPATIENT_CLINIC_OR_DEPARTMENT_OTHER): Admitting: Physical Therapy

## 2024-07-12 ENCOUNTER — Other Ambulatory Visit: Payer: Self-pay | Admitting: Gastroenterology

## 2024-07-17 ENCOUNTER — Ambulatory Visit (HOSPITAL_BASED_OUTPATIENT_CLINIC_OR_DEPARTMENT_OTHER): Admitting: Physical Therapy

## 2024-07-19 ENCOUNTER — Ambulatory Visit (HOSPITAL_BASED_OUTPATIENT_CLINIC_OR_DEPARTMENT_OTHER): Admitting: Physical Therapy

## 2024-07-19 DIAGNOSIS — M25612 Stiffness of left shoulder, not elsewhere classified: Secondary | ICD-10-CM

## 2024-07-19 DIAGNOSIS — M25512 Pain in left shoulder: Secondary | ICD-10-CM

## 2024-07-19 DIAGNOSIS — M6281 Muscle weakness (generalized): Secondary | ICD-10-CM

## 2024-07-19 NOTE — Therapy (Signed)
 OUTPATIENT PHYSICAL THERAPY SHOULDER TREATMENT   Patient Name: Bianca Phillips MRN: [id number] DOB:[birth year], 68 y.o., female Today's Date: 07/20/2024  END OF SESSION:  PT End of Session - 07/19/24 1116     Visit Number 7    Number of Visits 7    Date for Recertification      Authorization Type HTA    PT Start Time 1115    PT Stop Time 1151    PT Time Calculation (min) 36 min    Activity Tolerance Patient tolerated treatment well    Behavior During Therapy [facility] for tasks assessed/performed              Past Medical History:  Diagnosis Date   Acute non-recurrent frontal sinusitis 07/14/2022   Adhesive capsulitis of right shoulder 02/05/2021   Allergy    Complication of anesthesia    Constipation    Esophagitis, [place] grade C 06/01/2023   GAD (generalized anxiety disorder)    GERD (gastroesophageal reflux disease)    Heart murmur    Heart palpitations    evaluated by cardiologist--- dr h. hobart, note in epic 04-03-2021, normal echo and monitor showed NSR w/ SVT   History of COVID-19    per pt had covid twice in 12/ 2020 with mild symptoms with residual intermittant loss of taste;  and 04/ 2022 with mild symptoms that resolved   History of hyperthyroidism    age 68 s/p RAI   History of kidney stones    History of recurrent UTIs    History of repair of hiatal hernia 02/2020   Hyperlipidemia    Hypothyroidism, postablative    age 56  s/p RAI for hyperthyroidism;  followed by pcp   IDA (iron deficiency anemia)    Itching in the vaginal area 02/05/2021   Kidney stone 12/16/2014   MDD (major depressive disorder)    OA (osteoarthritis)    Pain in left shin 01/19/2023   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   Right ureteral calculus    Seasonal allergic rhinitis    Type 2 diabetes mellitus (HCC)    followed by pcp    (07-14-2021  per pt checks blood sugar at home 2-3 times weekly,  fasting sugar --120-130)   Urgency of urination    Wears hearing aid in  both ears    Past Surgical History:  Procedure Laterality Date   CESAREAN SECTION     x2  last one 1986   CYSTOSCOPY/URETEROSCOPY/HOLMIUM LASER/STENT PLACEMENT  2012   approx   CYSTOSCOPY/URETEROSCOPY/HOLMIUM LASER/STENT PLACEMENT Right 07/20/2021   Procedure: CYSTOSCOPY/RETROGRADE/URETEROSCOPY/HOLMIUM LASER/STENT PLACEMENT;  Surgeon: Selma Donnice SAUNDERS, MD;  Location: [facility] ([place]  Service: Urology;  Laterality: Right;  ONLY NEEDS 45 MIN   LAPAROSCOPIC NISSEN FUNDOPLICATION  02/2020   TOTAL ABDOMINAL HYSTERECTOMY W/ BILATERAL SALPINGOOPHORECTOMY  2002   TOTAL HIP ARTHROPLASTY Right 08/30/2021   Procedure: TOTAL HIP ARTHROPLASTY ANTERIOR APPROACH;  Surgeon: Vernetta Lonni GRADE, MD;  Location: [facility] OR;  Service: Orthopedics;  Laterality: Right;   UPPER GASTROINTESTINAL ENDOSCOPY     Patient Active Problem List   Diagnosis Date Noted   Elevated blood pressure reading in office without diagnosis of hypertension 05/30/2024   Left shoulder pain 05/30/2024   Trigger finger, right middle finger 06/24/2023   Gastric ulcer without hemorrhage or perforation 06/01/2023   Chronic constipation 01/19/2023   Closed right hip fracture (HCC) 08/29/2021   Insomnia 06/24/2021   Candida lusitanea infection 03/16/2021   Chronic kidney disease (CKD) stage G3b/A2,  moderately decreased glomerular filtration rate (GFR) between 30-44 mL/min/1.73 square meter and albuminuria creatinine ratio between 30-299 mg/g (HCC) 03/16/2021   Acute midline low back pain without sciatica 02/24/2021   Tachycardia 02/05/2021   Generalized anxiety disorder 02/05/2021   Heart palpitations 02/05/2021   Urinary frequency 02/05/2021   Type 2 diabetes mellitus with hyperglycemia, without long-term current use of insulin  (HCC) 02/05/2021   Postoperative hypothyroidism 02/05/2021   Hyperlipidemia associated with type 2 diabetes mellitus (HCC) 02/05/2021   Intractable migraine without aura and without status migrainosus  02/05/2021   Seasonal allergic rhinitis due to pollen 02/05/2021   Gastroesophageal reflux disease 02/27/2020   Lump of right breast 11/28/2019    PCP: Oris Camie BRAVO, NP  REFERRING PROVIDER: Vernetta Lonni GRADE, MD   REFERRING DIAG: [phone] (ICD-10-CM) - Acute pain of left shoulder M75.42 (ICD-10-CM) - Impingement syndrome of left shoulder  THERAPY DIAG:  Left shoulder pain, unspecified chronicity  Muscle weakness (generalized)  Stiffness of left shoulder, not elsewhere classified  Rationale for Evaluation and Treatment: Rehabilitation  ONSET DATE: July 2025  SUBJECTIVE:                                                                                                                                                                                      SUBJECTIVE STATEMENT:  Pt states she has no pain in her shoulder though does have pain in R > L knee.  Pt reports no pain with swimming.  She has no pain or difficulty with performing hair care and don/doffing shirt.  Pt reports she is able to perform her normal overhead activities without significant pain.  Pt reports compliance with HEP.  She denies pain currently.     Hand dominance: Right  PERTINENT HISTORY: R shoulder adhesive capsulitis in 2024 DM type 2 R THA in 2022 depression Anemia  PAIN:  NPRS:  0/10 currently, 2/10 worst. Worst pain with unexpected movements Location:  L shoulder and proximal UE  PRECAUTIONS: Other: R THA   WEIGHT BEARING RESTRICTIONS: No  FALLS:  Has patient fallen in last 6 months? No  LIVING ENVIRONMENT: Lives with: lives alone   OCCUPATION: Retired  PLOF: Independent  PATIENT GOALS:to perform her normal act's including swimming without pain   OBJECTIVE:  Note: Objective measures were completed at Evaluation unless otherwise noted.  DIAGNOSTIC FINDINGS:  X rays on 04/23/24 FINDINGS: There is no evidence of fracture or dislocation. Glenohumeral degenerative change  with spurring from the humeral head and mild joint space narrowing. There is minimal acromioclavicular degenerative spurring. No evidence of erosion or focal bone abnormality. Soft tissues are unremarkable.   IMPRESSION: Mild glenohumeral and acromioclavicular degenerative  change.  PATIENT SURVEYS:  UEFI:  68/80 (INITIAL)  COGNITION: Overall cognitive status: Within functional limits for tasks assessed      UPPER EXTREMITY ROM:   AROM Right Eval  Left eval Left 10/16  Shoulder flexion 136 138 with pain PROM:  160 154  Shoulder scaption 134 146 151  Shoulder abduction 96 135 137  Shoulder adduction     Shoulder internal rotation  65   Shoulder external rotation  44/49 AROM/PROM 50/50 AROM/PROM  Elbow flexion     Elbow extension     Wrist flexion     Wrist extension     Wrist ulnar deviation     Wrist radial deviation     Wrist pronation     Wrist supination     (Blank rows = not tested)  UPPER EXTREMITY MMT:  MMT Right eval Left eval Left 10/16  Shoulder flexion   4+/5  Shoulder scaption   4+/5  Shoulder abduction     Shoulder adduction     Shoulder internal rotation  4-/5 4-/5  Shoulder external rotation  4/5 4+/5  Middle trapezius     Lower trapezius     Elbow flexion     Elbow extension     Wrist flexion     Wrist extension     Wrist ulnar deviation     Wrist radial deviation     Wrist pronation     Wrist supination     Grip strength (lbs)     (Blank rows = not tested)                                                                                                                              TREATMENT:   07/19/24 Pulleys in flexion and scaption x 20 each Assessed shoulder ROM and strength.  See above.  ER/IR with arm at side RTB 2x10-12 Reviewed and updated HEP.  Pt received a HEP handout and was educated in correct form and appropriate frequency.     UEFI: 79/80    PATIENT EDUCATION: Education details: dx, relevant anatomy, POC,  objective findings, progress, goal progress, rationale of interventions, and HEP.  Person educated: Patient Education method: Explanation, Demonstration, Tactile cues, Verbal cues, and Handouts Education comprehension: verbalized understanding, returned demonstration, verbal cues required, tactile cues required, and needs further education  HOME EXERCISE PROGRAM: Access Code: [email or web address] URL: [email or web address] Date: 06/01/2024 Prepared by: Mose Minerva  Updated HEP: - Shoulder Internal Rotation with Resistance  - 1 x daily - 3-4 x weekly - 2-3 sets - 10 reps  ASSESSMENT:  CLINICAL IMPRESSION:  Pt present sot treatment having no pain and her worst pain has improved from 5/10 to 2/10.  Pt has made excellent progress in function.  She reports no pain with swimming and has no pain or difficulty with performing hair care and don/doffing shirt.  Pt is also able to perform her normal overhead activities without significant pain.  Pt  still has limitations in ER ROM though has improved with ROM t/o L shoulder.  She demonstrates minimally improved ER strength.  Pt continues to have some weakness t/o L shoulder.  PT went through HEP with pt and educated pt in correct exercises, correct form, and appropriate frequency.  PT updated HEP and pt demonstrates good understanding.  She is independent with HEP.  Pt demonstrates clinically significant improvement in self perceived disability with UEFI improving from 68/80 initially to 79/80 currently.  Pt has met all goals except long term strength goal.  Pt is ready for discharge.    OBJECTIVE IMPAIRMENTS: decreased ROM, decreased strength, hypomobility, impaired flexibility, impaired UE functional use, and pain.   ACTIVITY LIMITATIONS: carrying, lifting, dressing, reach over head, and hygiene/grooming  PARTICIPATION LIMITATIONS: shopping and swimming  PERSONAL FACTORS: 1-2 comorbidities: DM type 2, R shoulder adhesive capsulitis are also  affecting patient's functional outcome.   REHAB POTENTIAL: Good  CLINICAL DECISION MAKING: Stable/uncomplicated  EVALUATION COMPLEXITY: Low   GOALS:   SHORT TERM GOALS: Target date: 06/21/2024   Pt will be independent and compliant with HEP for improved pain, ROM, strength, and function.   Baseline: Goal status: GOAL MET  10/16  2.  Pt will demo at least a 10 deg increase in L shoulder flexion AROM and demo at least 50 deg in ER AROM for improved shoulder mobility and stiffness.  Baseline:  Goal status: GOAL MET  10/16  3.  Pt will report worst pain to be no > 3/10 for improved pain with unexpected movements. Baseline:  Goal status: GOAL MET  10/16  4.  Pt will be able to perform hair care without difficulty and without increased pain.  Baseline:  Goal status: GOAL MET  10/16 Target date:  06/28/24   LONG TERM GOALS: Target date: 07/11/2024  Pt will report no pain with swimming. Baseline:  Goal status: GOAL MET  10/16  2.  Pt will be able to don/doff shirt without increased pain and difficulty.   Baseline:  Goal status: GOAL MET  10/16  3.  Pt will report she is able to perform her normal overhead activities without significant pain.  Baseline:  Goal status:  GOAL MET  10/16  4.  Pt will demo improved L shoulder strength to 5/5 MMT in flexion, scaption, ER, and IR for improved tolerance with functional carrying and lifting.  Baseline:  Goal status:  NOT MET    PLAN:  PT FREQUENCY: 1 visit  PT DURATION: 1 visit  PLANNED INTERVENTIONS: Therapeutic exercises, Therapeutic activity, Neuromuscular re-education, Patient/Family education, Self Care, Joint mobilization, Aquatic Therapy, Dry Needling, Electrical stimulation, Spinal mobilization, Cryotherapy, Moist heat, Taping, Ultrasound, Manual therapy, and Re-evaluation   PLAN FOR NEXT SESSION:  Pt to be discharged from skilled PT due to meeting all goals except long term strength goal.  Pt is agreeable with  discharge.  She will continue with HEP.   PHYSICAL THERAPY DISCHARGE SUMMARY  Visits from Start of Care: 7  Current functional level related to goals / functional outcomes: See above   Remaining deficits: See above   Education / Equipment: See above     Leigh Minerva III PT, DPT 07/20/24 3:26 PM

## 2024-07-20 ENCOUNTER — Encounter (HOSPITAL_BASED_OUTPATIENT_CLINIC_OR_DEPARTMENT_OTHER): Payer: Self-pay | Admitting: Physical Therapy

## 2024-07-22 ENCOUNTER — Other Ambulatory Visit: Payer: Self-pay | Admitting: Nurse Practitioner

## 2024-07-22 DIAGNOSIS — F411 Generalized anxiety disorder: Secondary | ICD-10-CM

## 2024-07-23 NOTE — Telephone Encounter (Signed)
 Last appt 05/22/24.

## 2024-07-24 ENCOUNTER — Encounter (HOSPITAL_BASED_OUTPATIENT_CLINIC_OR_DEPARTMENT_OTHER): Admitting: Physical Therapy

## 2024-07-27 ENCOUNTER — Ambulatory Visit: Payer: Self-pay | Admitting: Internal Medicine

## 2024-07-31 ENCOUNTER — Encounter (HOSPITAL_BASED_OUTPATIENT_CLINIC_OR_DEPARTMENT_OTHER): Admitting: Physical Therapy

## 2024-08-06 ENCOUNTER — Encounter: Payer: Self-pay | Admitting: Radiology

## 2024-08-15 ENCOUNTER — Encounter (HOSPITAL_BASED_OUTPATIENT_CLINIC_OR_DEPARTMENT_OTHER): Admitting: Physical Therapy

## 2024-08-21 ENCOUNTER — Encounter (HOSPITAL_BASED_OUTPATIENT_CLINIC_OR_DEPARTMENT_OTHER): Admitting: Physical Therapy

## 2024-08-29 ENCOUNTER — Encounter (HOSPITAL_BASED_OUTPATIENT_CLINIC_OR_DEPARTMENT_OTHER): Admitting: Physical Therapy

## 2024-09-05 ENCOUNTER — Encounter (HOSPITAL_BASED_OUTPATIENT_CLINIC_OR_DEPARTMENT_OTHER): Admitting: Physical Therapy

## 2024-09-12 ENCOUNTER — Other Ambulatory Visit: Payer: Self-pay | Admitting: Nurse Practitioner

## 2024-09-12 DIAGNOSIS — F320 Major depressive disorder, single episode, mild: Secondary | ICD-10-CM

## 2024-09-12 NOTE — Telephone Encounter (Signed)
 Last appt 05/22/24.

## 2024-09-13 ENCOUNTER — Other Ambulatory Visit: Payer: Self-pay | Admitting: "Endocrinology

## 2024-09-13 DIAGNOSIS — R7989 Other specified abnormal findings of blood chemistry: Secondary | ICD-10-CM

## 2024-09-13 NOTE — Telephone Encounter (Signed)
 Requested Prescriptions     Pending Prescriptions Disp Refills    levothyroxine (SYNTHROID) 100 MCG tablet [Pharmacy Med Name: LEVOTHYROXINE 100 MCG TABLET] 90 tablet 1     Sig: TAKE 1 TABLET BY MOUTH EVERY DAY

## 2024-10-07 ENCOUNTER — Other Ambulatory Visit: Payer: Self-pay | Admitting: Nurse Practitioner

## 2024-10-07 DIAGNOSIS — F320 Major depressive disorder, single episode, mild: Secondary | ICD-10-CM

## 2024-10-09 DIAGNOSIS — E782 Mixed hyperlipidemia: Secondary | ICD-10-CM

## 2024-10-09 DIAGNOSIS — E1165 Type 2 diabetes mellitus with hyperglycemia: Secondary | ICD-10-CM

## 2024-10-19 ENCOUNTER — Other Ambulatory Visit: Payer: Self-pay | Admitting: Nurse Practitioner

## 2024-10-19 NOTE — Telephone Encounter (Signed)
 Last appt 05/22/24.

## 2024-11-01 LAB — OPHTHALMOLOGY REPORT-SCANNED

## 2024-11-06 ENCOUNTER — Ambulatory Visit: Payer: HMO

## 2024-12-20 ENCOUNTER — Other Ambulatory Visit

## 2024-12-25 ENCOUNTER — Ambulatory Visit: Admitting: "Endocrinology

## 2025-01-01 ENCOUNTER — Ambulatory Visit
